# Patient Record
Sex: Female | Born: 1935 | Race: White | Hispanic: No | Marital: Single | Smoking: Never smoker
Health system: Southern US, Community
[De-identification: ages and names within clinical notes are randomized; demographics above are authoritative.]

## PROBLEM LIST (undated history)

## (undated) DIAGNOSIS — I10 Essential (primary) hypertension: Secondary | ICD-10-CM

## (undated) DIAGNOSIS — E7800 Pure hypercholesterolemia, unspecified: Secondary | ICD-10-CM

## (undated) DIAGNOSIS — F0390 Unspecified dementia without behavioral disturbance: Secondary | ICD-10-CM

## (undated) DIAGNOSIS — I639 Cerebral infarction, unspecified: Secondary | ICD-10-CM

## (undated) DIAGNOSIS — I509 Heart failure, unspecified: Secondary | ICD-10-CM

## (undated) DIAGNOSIS — J449 Chronic obstructive pulmonary disease, unspecified: Secondary | ICD-10-CM

## (undated) DIAGNOSIS — H539 Unspecified visual disturbance: Secondary | ICD-10-CM

## (undated) HISTORY — PX: CHOLECYSTECTOMY: SHX55

## (undated) HISTORY — DX: Unspecified visual disturbance: H53.9

## (undated) HISTORY — DX: Heart failure, unspecified: I50.9

## (undated) HISTORY — DX: Cerebral infarction, unspecified: I63.9

---

## 2015-02-04 DIAGNOSIS — S8991XA Unspecified injury of right lower leg, initial encounter: Secondary | ICD-10-CM | POA: Diagnosis not present

## 2015-02-04 DIAGNOSIS — M25551 Pain in right hip: Secondary | ICD-10-CM | POA: Diagnosis not present

## 2015-02-04 DIAGNOSIS — M25569 Pain in unspecified knee: Secondary | ICD-10-CM | POA: Diagnosis not present

## 2015-02-04 DIAGNOSIS — M25561 Pain in right knee: Secondary | ICD-10-CM | POA: Diagnosis not present

## 2015-02-04 DIAGNOSIS — W19XXXA Unspecified fall, initial encounter: Secondary | ICD-10-CM | POA: Diagnosis not present

## 2015-02-04 DIAGNOSIS — M549 Dorsalgia, unspecified: Secondary | ICD-10-CM | POA: Diagnosis not present

## 2015-02-04 DIAGNOSIS — S79911A Unspecified injury of right hip, initial encounter: Secondary | ICD-10-CM | POA: Diagnosis not present

## 2015-02-10 DIAGNOSIS — M545 Low back pain: Secondary | ICD-10-CM | POA: Diagnosis not present

## 2015-02-10 DIAGNOSIS — Z9189 Other specified personal risk factors, not elsewhere classified: Secondary | ICD-10-CM | POA: Diagnosis not present

## 2015-02-25 DIAGNOSIS — W19XXXA Unspecified fall, initial encounter: Secondary | ICD-10-CM | POA: Diagnosis not present

## 2015-02-25 DIAGNOSIS — R05 Cough: Secondary | ICD-10-CM | POA: Diagnosis not present

## 2015-06-02 DIAGNOSIS — I10 Essential (primary) hypertension: Secondary | ICD-10-CM | POA: Diagnosis not present

## 2015-06-02 DIAGNOSIS — E780 Pure hypercholesterolemia: Secondary | ICD-10-CM | POA: Diagnosis not present

## 2015-06-17 DIAGNOSIS — E875 Hyperkalemia: Secondary | ICD-10-CM | POA: Diagnosis not present

## 2015-06-17 DIAGNOSIS — I10 Essential (primary) hypertension: Secondary | ICD-10-CM | POA: Diagnosis not present

## 2015-06-17 DIAGNOSIS — R197 Diarrhea, unspecified: Secondary | ICD-10-CM | POA: Diagnosis not present

## 2015-06-17 DIAGNOSIS — R413 Other amnesia: Secondary | ICD-10-CM | POA: Diagnosis not present

## 2015-07-14 DIAGNOSIS — Z79899 Other long term (current) drug therapy: Secondary | ICD-10-CM | POA: Diagnosis not present

## 2015-07-14 DIAGNOSIS — R410 Disorientation, unspecified: Secondary | ICD-10-CM | POA: Diagnosis not present

## 2015-08-04 DIAGNOSIS — R351 Nocturia: Secondary | ICD-10-CM | POA: Diagnosis not present

## 2015-08-24 ENCOUNTER — Encounter (HOSPITAL_COMMUNITY): Payer: Self-pay | Admitting: Emergency Medicine

## 2015-08-24 ENCOUNTER — Emergency Department (INDEPENDENT_AMBULATORY_CARE_PROVIDER_SITE_OTHER)
Admission: EM | Admit: 2015-08-24 | Discharge: 2015-08-24 | Disposition: A | Discharge status: Home / Self Care | Payer: Medicare Other | Source: Home / Self Care | Attending: Family Medicine | Admitting: Family Medicine

## 2015-08-24 ENCOUNTER — Emergency Department (INDEPENDENT_AMBULATORY_CARE_PROVIDER_SITE_OTHER): Payer: Medicare Other

## 2015-08-24 DIAGNOSIS — M1711 Unilateral primary osteoarthritis, right knee: Secondary | ICD-10-CM

## 2015-08-24 DIAGNOSIS — M25561 Pain in right knee: Secondary | ICD-10-CM | POA: Diagnosis not present

## 2015-08-24 HISTORY — DX: Pure hypercholesterolemia, unspecified: E78.00

## 2015-08-24 HISTORY — DX: Essential (primary) hypertension: I10

## 2015-08-24 MED ORDER — HYDROCODONE-ACETAMINOPHEN 5-325 MG PO TABS: 1.0000 | ORAL_TABLET | Freq: Four times a day (QID) | ORAL | Status: DC | PRN

## 2015-08-24 NOTE — Discharge Instructions (Signed)
Use splint as possible, use pain medicine as needed, see orthopedist for further care .

## 2015-08-24 NOTE — ED Provider Notes (Addendum)
CSN: [id number]     Arrival date & time 08/24/15  1915 History   First MD Initiated Contact with Patient 08/24/15 2037     Chief Complaint  Patient presents with  . Knee Pain   (Consider location/radiation/quality/duration/timing/severity/associated sxs/prior Treatment) Patient is a 81 y.o. female presenting with knee pain. The history is provided by the patient and a caregiver.  Knee Pain Location:  Knee Time since incident:  12 hours Injury: no   Knee location:  R knee Pain details:    Quality:  Sharp   Radiates to:  Does not radiate   Severity:  Moderate   Onset quality:  Gradual   Progression:  Worsening Chronicity:  Recurrent Dislocation: no   Prior injury to area:  Yes (known end stage oa to knee , now unable to stand  unassisted, today knee gave way and  slid to floor while  assisted, continues with pain.) Relieved by:  None tried Worsened by:  Nothing tried Ineffective treatments:  None tried Associated symptoms: decreased ROM and muscle weakness   Risk factors: known bone disorder     Past Medical History  Diagnosis Date  . High cholesterol   . Hypertension    Past Surgical History  Procedure Laterality Date  . Cholecystectomy     No family history on file. Social History  Substance Use Topics  . Smoking status: None  . Smokeless tobacco: None  . Alcohol Use: None   OB History    No data available     Review of Systems  Constitutional: Negative.   Musculoskeletal: Positive for joint swelling and gait problem.  Skin: Negative.   All other systems reviewed and are negative.   Allergies  Review of patient's allergies indicates no known allergies.  Home Medications   Prior to Admission medications   Medication Sig Start Date End Date Taking? Authorizing Provider  ALPRAZolam Prudy Feeler) 0.5 MG tablet Take 0.5 mg by mouth at bedtime as needed for anxiety.   Yes Historical Provider, MD  amLODipine (NORVASC) 5 MG tablet Take 5 mg by mouth daily.   Yes  Historical Provider, MD  Cholecalciferol (VITAMIN D-3 PO) Take by mouth.   Yes Historical Provider, MD  indapamide (LOZOL) 2.5 MG tablet Take 2.5 mg by mouth daily.   Yes Historical Provider, MD  latanoprost (XALATAN) 0.005 % ophthalmic solution 1 drop at bedtime.   Yes Historical Provider, MD  meloxicam (MOBIC) 7.5 MG tablet Take 7.5 mg by mouth daily.   Yes Historical Provider, MD  Multiple Vitamins-Minerals (CENTRUM ADULTS PO) Take by mouth.   Yes Historical Provider, MD  simvastatin (ZOCOR) 20 MG tablet Take 20 mg by mouth daily.   Yes Historical Provider, MD  traMADol (ULTRAM) 50 MG tablet Take by mouth every 6 (six) hours as needed.   Yes Historical Provider, MD  traZODone (DESYREL) 50 MG tablet Take 50 mg by mouth at bedtime.   Yes Historical Provider, MD  HYDROcodone-acetaminophen (NORCO/VICODIN) 5-325 MG per tablet Take 1 tablet by mouth every 6 (six) hours as needed. 08/24/15   Linna Hoff, MD   Meds Ordered and Administered this Visit  Medications - No data to display  BP 182/81 mmHg  Pulse 70  Temp(Src) 97.6 F (36.4 C) (Oral)  Resp 18  SpO2 97% No data found.   Physical Exam  Constitutional: She is oriented to person, place, and time. She appears well-developed and well-nourished. No distress.  Musculoskeletal: She exhibits tenderness.       Right  knee: She exhibits decreased range of motion, swelling, effusion, deformity and bony tenderness. She exhibits no erythema. Tenderness found. Medial joint line and MCL tenderness noted.  Neurological: She is alert and oriented to person, place, and time.  Skin: Skin is warm and dry.  Nursing note and vitals reviewed.   ED Course  Procedures (including critical care time)  Labs Review Labs Reviewed - No data to display  Imaging Review No results found.  X-rays reviewed and report per radiologist.  Visual Acuity Review  Right Eye Distance:   Left Eye Distance:   Bilateral Distance:    Right Eye Near:   Left Eye  Near:    Bilateral Near:         MDM   1. Primary osteoarthritis of right knee    rx for norco and knee immobilizer.    Linna Hoff, MD 08/24/15 2122  Linna Hoff, MD 08/26/15 2052

## 2015-08-24 NOTE — ED Notes (Signed)
Knee pain and unwiling to bear weight on knee since this morning.  Patient went down to her knee , assisted to her knee by caregiver.

## 2015-08-27 DIAGNOSIS — M1711 Unilateral primary osteoarthritis, right knee: Secondary | ICD-10-CM | POA: Diagnosis not present

## 2015-09-03 DIAGNOSIS — M1711 Unilateral primary osteoarthritis, right knee: Secondary | ICD-10-CM | POA: Diagnosis not present

## 2015-09-15 DIAGNOSIS — Z23 Encounter for immunization: Secondary | ICD-10-CM | POA: Diagnosis not present

## 2015-09-15 DIAGNOSIS — M25561 Pain in right knee: Secondary | ICD-10-CM | POA: Diagnosis not present

## 2015-09-15 DIAGNOSIS — R413 Other amnesia: Secondary | ICD-10-CM | POA: Diagnosis not present

## 2015-09-17 DIAGNOSIS — M1711 Unilateral primary osteoarthritis, right knee: Secondary | ICD-10-CM | POA: Diagnosis not present

## 2015-09-18 ENCOUNTER — Emergency Department (HOSPITAL_COMMUNITY): Payer: Medicare Other

## 2015-09-18 ENCOUNTER — Inpatient Hospital Stay (HOSPITAL_COMMUNITY)
Admission: EM | Admit: 2015-09-18 | Discharge: 2015-09-20 | DRG: 177 | Disposition: A | Discharge status: Home / Self Care | Payer: Medicare Other | Attending: Internal Medicine | Admitting: Internal Medicine

## 2015-09-18 ENCOUNTER — Encounter (HOSPITAL_COMMUNITY): Payer: Self-pay

## 2015-09-18 DIAGNOSIS — F03918 Unspecified dementia, unspecified severity, with other behavioral disturbance: Secondary | ICD-10-CM | POA: Diagnosis present

## 2015-09-18 DIAGNOSIS — G9340 Encephalopathy, unspecified: Secondary | ICD-10-CM | POA: Diagnosis not present

## 2015-09-18 DIAGNOSIS — Z66 Do not resuscitate: Secondary | ICD-10-CM | POA: Diagnosis not present

## 2015-09-18 DIAGNOSIS — Z791 Long term (current) use of non-steroidal anti-inflammatories (NSAID): Secondary | ICD-10-CM | POA: Diagnosis not present

## 2015-09-18 DIAGNOSIS — R4182 Altered mental status, unspecified: Secondary | ICD-10-CM | POA: Diagnosis not present

## 2015-09-18 DIAGNOSIS — J189 Pneumonia, unspecified organism: Secondary | ICD-10-CM | POA: Diagnosis present

## 2015-09-18 DIAGNOSIS — R55 Syncope and collapse: Secondary | ICD-10-CM | POA: Diagnosis not present

## 2015-09-18 DIAGNOSIS — J690 Pneumonitis due to inhalation of food and vomit: Secondary | ICD-10-CM | POA: Diagnosis not present

## 2015-09-18 DIAGNOSIS — Z79899 Other long term (current) drug therapy: Secondary | ICD-10-CM

## 2015-09-18 DIAGNOSIS — R0682 Tachypnea, not elsewhere classified: Secondary | ICD-10-CM | POA: Diagnosis present

## 2015-09-18 DIAGNOSIS — R0602 Shortness of breath: Secondary | ICD-10-CM | POA: Diagnosis not present

## 2015-09-18 DIAGNOSIS — R109 Unspecified abdominal pain: Secondary | ICD-10-CM | POA: Diagnosis not present

## 2015-09-18 DIAGNOSIS — F0391 Unspecified dementia with behavioral disturbance: Secondary | ICD-10-CM | POA: Diagnosis not present

## 2015-09-18 DIAGNOSIS — I10 Essential (primary) hypertension: Secondary | ICD-10-CM | POA: Diagnosis present

## 2015-09-18 DIAGNOSIS — R402431 Glasgow coma scale score 3-8, in the field [EMT or ambulance]: Secondary | ICD-10-CM | POA: Diagnosis not present

## 2015-09-18 DIAGNOSIS — E785 Hyperlipidemia, unspecified: Secondary | ICD-10-CM | POA: Diagnosis present

## 2015-09-18 DIAGNOSIS — E876 Hypokalemia: Secondary | ICD-10-CM | POA: Diagnosis present

## 2015-09-18 DIAGNOSIS — E7800 Pure hypercholesterolemia, unspecified: Secondary | ICD-10-CM | POA: Diagnosis not present

## 2015-09-18 LAB — URINALYSIS, ROUTINE W REFLEX MICROSCOPIC
Bilirubin Urine: NEGATIVE
Glucose, UA: NEGATIVE mg/dL
Hgb urine dipstick: NEGATIVE
KETONES UR: NEGATIVE mg/dL
LEUKOCYTES UA: NEGATIVE
NITRITE: NEGATIVE
PH: 7 (ref 5.0–8.0)
PROTEIN: NEGATIVE mg/dL
Specific Gravity, Urine: 1.023 (ref 1.005–1.030)
Urobilinogen, UA: 0.2 mg/dL (ref 0.0–1.0)

## 2015-09-18 LAB — CBC WITH DIFFERENTIAL/PLATELET
BASOS ABS: 0 10*3/uL (ref 0.0–0.1)
BASOS PCT: 0 %
EOS PCT: 0 %
Eosinophils Absolute: 0 10*3/uL (ref 0.0–0.7)
HCT: 43.9 % (ref 36.0–46.0)
Hemoglobin: 15.1 g/dL — ABNORMAL HIGH (ref 12.0–15.0)
Lymphocytes Relative: 13 %
Lymphs Abs: 1.3 10*3/uL (ref 0.7–4.0)
MCH: 32.2 pg (ref 26.0–34.0)
MCHC: 34.4 g/dL (ref 30.0–36.0)
MCV: 93.6 fL (ref 78.0–100.0)
MONO ABS: 0.8 10*3/uL (ref 0.1–1.0)
Monocytes Relative: 8 %
Neutro Abs: 7.5 10*3/uL (ref 1.7–7.7)
Neutrophils Relative %: 79 %
PLATELETS: 187 10*3/uL (ref 150–400)
RBC: 4.69 MIL/uL (ref 3.87–5.11)
RDW: 12.9 % (ref 11.5–15.5)
WBC: 9.5 10*3/uL (ref 4.0–10.5)

## 2015-09-18 LAB — COMPREHENSIVE METABOLIC PANEL
ALBUMIN: 3.9 g/dL (ref 3.5–5.0)
ALT: 22 U/L (ref 14–54)
AST: 30 U/L (ref 15–41)
Alkaline Phosphatase: 88 U/L (ref 38–126)
Anion gap: 11 (ref 5–15)
BUN: 17 mg/dL (ref 6–20)
CHLORIDE: 97 mmol/L — AB (ref 101–111)
CO2: 26 mmol/L (ref 22–32)
Calcium: 10.1 mg/dL (ref 8.9–10.3)
Creatinine, Ser: 0.83 mg/dL (ref 0.44–1.00)
GFR calc Af Amer: >60 mL/min (ref >60)
GFR, EST NON AFRICAN AMERICAN: 59 mL/min — AB (ref >60)
Glucose, Bld: 143 mg/dL — ABNORMAL HIGH (ref 65–99)
POTASSIUM: 3.4 mmol/L — AB (ref 3.5–5.1)
Sodium: 134 mmol/L — ABNORMAL LOW (ref 135–145)
Total Bilirubin: 1 mg/dL (ref 0.3–1.2)
Total Protein: 6.9 g/dL (ref 6.5–8.1)

## 2015-09-18 LAB — I-STAT CG4 LACTIC ACID, ED
LACTIC ACID, VENOUS: 1.21 mmol/L (ref 0.5–2.0)
LACTIC ACID, VENOUS: 0.75 mmol/L (ref 0.5–2.0)

## 2015-09-18 LAB — LIPASE, BLOOD: LIPASE: 19 U/L — AB (ref 22–51)

## 2015-09-18 LAB — TYPE AND SCREEN
ABO/RH(D): A POS
ANTIBODY SCREEN: NEGATIVE

## 2015-09-18 LAB — ABO/RH: ABO/RH(D): A POS

## 2015-09-18 LAB — STREP PNEUMONIAE URINARY ANTIGEN: STREP PNEUMO URINARY ANTIGEN: NEGATIVE

## 2015-09-18 MED ORDER — VANCOMYCIN HCL 10 G IV SOLR
1500.0000 mg | Freq: Once | INTRAVENOUS | Status: AC
  Administered 2015-09-18: 1500 mg via INTRAVENOUS
  Filled 2015-09-18: qty 1500

## 2015-09-18 MED ORDER — SIMVASTATIN 20 MG PO TABS
20.0000 mg | ORAL_TABLET | Freq: Every day | ORAL | Status: DC
  Administered 2015-09-19 – 2015-09-20 (×2): 20 mg via ORAL
  Filled 2015-09-18 (×2): qty 1

## 2015-09-18 MED ORDER — DEXTROSE 5 % IV SOLN
500.0000 mg | INTRAVENOUS | Status: DC
  Administered 2015-09-19 (×2): 500 mg via INTRAVENOUS
  Filled 2015-09-18 (×2): qty 500

## 2015-09-18 MED ORDER — LATANOPROST 0.005 % OP SOLN
1.0000 [drp] | Freq: Every day | OPHTHALMIC | Status: DC
  Administered 2015-09-18 – 2015-09-19 (×2): 1 [drp] via OPHTHALMIC
  Filled 2015-09-18: qty 2.5

## 2015-09-18 MED ORDER — DEXTROSE 5 % IV SOLN
1.0000 g | INTRAVENOUS | Status: DC
  Administered 2015-09-18 – 2015-09-19 (×2): 1 g via INTRAVENOUS
  Filled 2015-09-18 (×3): qty 10

## 2015-09-18 MED ORDER — SODIUM CHLORIDE 0.9 % IV BOLUS (SEPSIS)
1000.0000 mL | Freq: Once | INTRAVENOUS | Status: AC
Start: 2015-09-18 — End: 2015-09-18
  Administered 2015-09-18: 1000 mL via INTRAVENOUS

## 2015-09-18 MED ORDER — TRAMADOL HCL 50 MG PO TABS: 50.0000 mg | ORAL_TABLET | Freq: Four times a day (QID) | ORAL | Status: DC | PRN

## 2015-09-18 MED ORDER — SODIUM CHLORIDE 0.9 % IV SOLN
INTRAVENOUS | Status: AC
  Administered 2015-09-18: 23:00:00 via INTRAVENOUS

## 2015-09-18 MED ORDER — ONDANSETRON HCL 4 MG/2ML IJ SOLN
4.0000 mg | Freq: Once | INTRAMUSCULAR | Status: AC
  Administered 2015-09-18: 4 mg via INTRAVENOUS
  Filled 2015-09-18: qty 2

## 2015-09-18 MED ORDER — HEPARIN SODIUM (PORCINE) 5000 UNIT/ML IJ SOLN
5000.0000 [IU] | Freq: Three times a day (TID) | INTRAMUSCULAR | Status: DC
  Administered 2015-09-18 – 2015-09-20 (×5): 5000 [IU] via SUBCUTANEOUS
  Filled 2015-09-18 (×6): qty 1

## 2015-09-18 MED ORDER — PIPERACILLIN-TAZOBACTAM 3.375 G IVPB 30 MIN
3.3750 g | Freq: Once | INTRAVENOUS | Status: AC
  Administered 2015-09-18: 3.375 g via INTRAVENOUS
  Filled 2015-09-18: qty 50

## 2015-09-18 MED ORDER — AMLODIPINE BESYLATE 5 MG PO TABS: 5.0000 mg | ORAL_TABLET | Freq: Every day | ORAL | Status: DC

## 2015-09-18 NOTE — ED Notes (Signed)
PER EMS: Ems called out for an unresponsive patient, pt not responsive to painful stimuli or voice. Hx of dementia. Pt family reports decline in past 2 days by being combative, erratic behavior that is unlike her. BP-162/82, HR-82, RR-14, CBG-106. Pt vomiting upon arrival. Resident at bedside upon pt arrival.

## 2015-09-18 NOTE — Progress Notes (Signed)
Report received from [name], RN from ED. Pt is to be admitted in  5W16. Awaiting pt's arrival.

## 2015-09-18 NOTE — ED Provider Notes (Signed)
CSN: [id number]     Arrival date & time 09/18/15  1527 History   First MD Initiated Contact with Patient 09/18/15 1531     Chief Complaint  Patient presents with  . Altered Mental Status   Katie Mcfarland is a 81 y.o. female with a past medical history significant for dementia, hypertension, hypercholesterolemia, and history of cholecystectomy who presents for altered mental status and vomiting. The patient is DO NOT RESUSCITATE. The patient is coming by her daughter, healthcare power of attorney, who reports that the patient has had worsening dementia for the last several months. The patient's family says that this has acutely worsened over the last 3 weeks with worsening agitation. The patient reportedly began acting less responsive yesterday bleeding up until today when she was not interacting with any visitors. The patient then had several episodes of emesis and was brought to the emergency department because she was unresponsive for the nursing home staff. The daughter reports that the patient has been eating less recently. They deny any recent falls or trauma. The patient has not had any fevers or chills, constipation, diarrhea. They do say that the patient has not urinated since yesterday. The patient recently started risperidone.    (Consider location/radiation/quality/duration/timing/severity/associated sxs/prior Treatment) Patient is a 81 y.o. female presenting with altered mental status. The history is provided by the EMS personnel, medical records and a relative (health care power of attorney). The history is limited by the condition of the patient.  Altered Mental Status Presenting symptoms: unresponsiveness   Severity:  Severe Most recent episode:  Yesterday Episode history:  Single Duration:  2 days Timing:  Constant Chronicity:  New Context: dementia and nursing home resident   Associated symptoms: decreased appetite, nausea and vomiting   Associated symptoms: no  abdominal pain, no bladder incontinence (pt has not urinated today), no fever and no rash   Vomiting:    Quality:  Stomach contents   Number of occurrences:  4 times in ED   Severity:  Moderate   Progression:  Unchanged   Past Medical History  Diagnosis Date  . High cholesterol   . Hypertension    Past Surgical History  Procedure Laterality Date  . Cholecystectomy     No family history on file. Social History  Substance Use Topics  . Smoking status: Not on file  . Smokeless tobacco: Not on file  . Alcohol Use: Not on file   OB History    No data available     Review of Systems  Unable to perform ROS: Mental status change (ROS provided by family)  Constitutional: Positive for activity change (less active), appetite change and decreased appetite. Negative for fever and chills.  HENT: Negative for congestion.   Respiratory: Negative for shortness of breath, wheezing and stridor.   Cardiovascular: Negative for leg swelling.  Gastrointestinal: Positive for nausea, vomiting and constipation. Negative for abdominal pain, diarrhea, blood in stool and abdominal distention.  Genitourinary: Positive for difficulty urinating. Negative for bladder incontinence (pt has not urinated today).  Skin: Negative for rash and wound.      Allergies  Review of patient's allergies indicates no known allergies.  Home Medications   Prior to Admission medications   Medication Sig Start Date End Date Taking? Authorizing Provider  ALPRAZolam Prudy Feeler) 0.5 MG tablet Take 0.5 mg by mouth at bedtime as needed for anxiety.    Historical Provider, MD  amLODipine (NORVASC) 5 MG tablet Take 5 mg by mouth daily.  Historical Provider, MD  Cholecalciferol (VITAMIN D-3 PO) Take by mouth.    Historical Provider, MD  HYDROcodone-acetaminophen (NORCO/VICODIN) 5-325 MG per tablet Take 1 tablet by mouth every 6 (six) hours as needed. 08/24/15   Linna Hoff, MD  indapamide (LOZOL) 2.5 MG tablet Take 2.5 mg by  mouth daily.    Historical Provider, MD  latanoprost (XALATAN) 0.005 % ophthalmic solution 1 drop at bedtime.    Historical Provider, MD  meloxicam (MOBIC) 7.5 MG tablet Take 7.5 mg by mouth daily.    Historical Provider, MD  Multiple Vitamins-Minerals (CENTRUM ADULTS PO) Take by mouth.    Historical Provider, MD  simvastatin (ZOCOR) 20 MG tablet Take 20 mg by mouth daily.    Historical Provider, MD  traMADol (ULTRAM) 50 MG tablet Take by mouth every 6 (six) hours as needed.    Historical Provider, MD  traZODone (DESYREL) 50 MG tablet Take 50 mg by mouth at bedtime.    Historical Provider, MD   There were no vitals taken for this visit. Physical Exam  Constitutional: She appears well-developed. No distress.  HENT:  Head: Normocephalic and atraumatic.  Mouth/Throat: No oropharyngeal exudate.  Eyes: Conjunctivae and EOM are normal. Pupils are equal, round, and reactive to light.  Cardiovascular: Normal rate, normal heart sounds and intact distal pulses.   No murmur heard. Pulmonary/Chest: Effort normal. No accessory muscle usage or stridor. Tachypnea noted. No respiratory distress. She has no decreased breath sounds. She has no wheezes. She has rhonchi. She exhibits no tenderness.  Abdominal: Soft. She exhibits no distension. There is no tenderness. There is no rebound.  Musculoskeletal: She exhibits no tenderness.  Neurological: She is unresponsive. She exhibits normal muscle tone. GCS eye subscore is 4. GCS verbal subscore is 1. GCS motor subscore is 4.  Skin: Skin is warm. She is not diaphoretic. No erythema.  Nursing note and vitals reviewed.   ED Course  Procedures (including critical care time) Labs Review Labs Reviewed  CBC WITH DIFFERENTIAL/PLATELET - Abnormal; Notable for the following:    Hemoglobin 15.1 (*)    All other components within normal limits  COMPREHENSIVE METABOLIC PANEL - Abnormal; Notable for the following:    Sodium 134 (*)    Potassium 3.4 (*)    Chloride 97  (*)    Glucose, Bld 143 (*)    GFR calc non Af Amer 59 (*)    All other components within normal limits  LIPASE, BLOOD - Abnormal; Notable for the following:    Lipase 19 (*)    All other components within normal limits  URINE CULTURE  CULTURE, BLOOD (ROUTINE X 2)  CULTURE, BLOOD (ROUTINE X 2)  URINALYSIS, ROUTINE W REFLEX MICROSCOPIC (NOT AT [facility])  I-STAT CG4 LACTIC ACID, ED  TYPE AND SCREEN  ABO/RH    Imaging Review Dg Abd 1 View  09/18/2015  CLINICAL DATA:  Vomiting, altered mental status for 1 day EXAM: ABDOMEN - 1 VIEW COMPARISON:  02/04/2015 FINDINGS: Prior cholecystectomy. Large stool burden throughout the colon. No evidence of bowel obstruction or free air. No organomegaly. No acute bony abnormality. IMPRESSION: Large stool burden.  No evidence of bowel obstruction or free air. Electronically Signed   By: Charlett Nose M.D.   On: 09/18/2015 16:31   Dg Chest Portable 1 View  09/18/2015  CLINICAL DATA:  Shortness of breath with altered mental status. EXAM: PORTABLE CHEST 1 VIEW COMPARISON:  December 24, 2013 FINDINGS: There is localized increased opacity in the left base. Lungs  elsewhere clear. Heart size and pulmonary vascularity are normal. No adenopathy. There is thoracolumbar levoscoliosis. IMPRESSION: Increased opacity left base, concerning for early pneumonia. Lungs elsewhere clear. No change in cardiac silhouette. Followup PA and lateral chest radiographs recommended in 3-4 weeks following trial of antibiotic therapy to ensure resolution and exclude underlying malignancy. Electronically Signed   By: Bretta BangWilliam  Woodruff III M.D.   On: 09/18/2015 16:07   I have personally reviewed and evaluated these images and lab results as part of my medical decision-making.   EKG Interpretation   Date/Time:  Friday September 18 2015 15:36:53 EDT Ventricular Rate:  84 PR Interval:  154 QRS Duration: 94 QT Interval:  353 QTC Calculation: 417 R Axis:   -58 Text Interpretation:  Sinus rhythm  Probable left atrial enlargement LAD,  consider left anterior fascicular block Abnormal R-wave progression, late  transition agree with above Confirmed by LITTLE MD, RACHEL [phone]([id number]) on  09/18/2015 3:54:30 PM      MDM   Katie Mcfarland is a 81 y.o. female with a past medical history significant for dementia, hypertension, hypercholesterolemia, and history of cholecystectomy who presents for altered mental status and vomiting. The patient arrived by EMS with her healthcare power of attorney, her daughter. The daughter confirmed that the patient is DO NOT RESUSCITATE and would not want to be intubated. On initial assessment, the patient has an altered mental status and is unable to respond to this examiner. Her GCS was determined to be approximately 9 on arrival. The patient is maintaining her oxygen saturation but was slightly tachypneic. The patient was not febrile on arrival. The daughter reports that the has had slowly progressive decline over the last several months with her dementia and accelerating the last 3 weeks but her symptoms at yesterday. The patient was noted to have several episodes of nonbloody emesis on arrival. The patient had not urinated since yesterday.  On exam, the patient has slightly coarse breath sounds bilaterally. Given the patient's tachypnea, altered mental status, and coarse breath sounds, suspect pneumonia and possible aspiration pneumonia with the emesis and altered mental status. The patient had a portable chest x-ray performed which revealed concern for developing pneumonia. The patient was given fluids, antibiotics were ordered, other laboratory testing were obtained, as well as cultures.   As the patient is 81 years old and altered with emesis, a CT of the head was also obtained in order to look for stroke or bleed. The patient did not have a focal neurological exam however, she did not participate in her neurological exam. The patient also had an x-ray of  the abdomen obtained given the emesis. There is no evidence of obstruction, free air. There was a large stool burden. The patient's abdomen was nontender to palpation.  The patient's CT did not show any evidence of acute intracranial abnormality.  Given the patient's lack of urination, suspect urinary tract infection causing altered mental status leading to the aspiration pneumonia.  Anticipate admission following completion of laboratory testing. The patient was admitted to the hospitalist service in stable condition for further management.  This patient was seen with Dr. Clarene DukeLittle, emergency medicine attending.   Final diagnoses:  Aspiration pneumonia, unspecified aspiration pneumonia type, unspecified laterality, unspecified part of lung [facility](HCC)      Theda Belfasthris Tegeler, MD 09/19/15 [id number]  Laurence Spatesachel Morgan Little, MD 09/19/15 [phone]

## 2015-09-18 NOTE — H&P (Signed)
[facility] History and Physical  [name] ZOX:[id number] DOB: 1920-11-15 DOA: 09/18/2015  Referring physician: ED physician PCP: Londell Moh, MD   Chief Complaint: change in behavior  HPI:  Ms. Katie Mcfarland is a 81yo woman, relatively healthy, with progressive dementia, HTN and HLD.  She presents due to change in behavior over the last month with becoming more combative, manipulative and difficult to manage.  This has gotten much worse of the last couple of days and today her family and caretaker report that she has not communicated at all with any of them.  She was brought to the ED for this lack of communication, and it continued in the ED.  She has not had much to eat or drink in that time.  In the ED, she was found to have vomited on herself and there was concern for aspiration.  She is DNR and her daughter in law notes that she is "ready to go" and has been since her husband died 2 years ago.  She lives at home and has 24 hour caregivers.  Ms. Katie Mcfarland was recently started on risperdone, but only took one dose the night before admission.   In the ED, the patient was non responsive, but responded to pain and protected her airway.  She had imaging, AXR revealed large stool burder.  CT head showed no new abnormality but chronic changes, CXR showed left base opacity concerning for developing PNA.  UA was clear.  Labs were relatively normal, K was mildly low at 3.4.  She was initiated on treatment for pneumonia.    Assessment and Plan:  LLL Pneumonia, possible pneumonitis - Unclear if this was an aspiration event, only reported vomiting in the ED - Possible developing pneumonia, she is coming from home, will treat as community acquired with Rocephin and Azithromycin - Monitor for improvement - O2 therapy as needed - BC X 2 and sputum culture if able - IVF with NS at 50cc/hr, increase if she is not improving - Zofran for nausea  Dementia with behavioral  disturbance - Progressively declining per family - Monitor closely - Hold risperidone - Low dose haldol if needed for behavioral issues    HLD (hyperlipidemia) - Continue simvastatin when able    HTN (hypertension) - Hydralazine PRN for SBP > 180 - Restart home meds when she is able to take them.    Diet: Clear liquids in case she is more alert, consider holding diet if appears to be unable to swallow.  Sitter will be with her  DVT PPx: Heparin SQ   Radiological Exams on Admission: Dg Abd 1 View  09/18/2015  CLINICAL DATA:  Vomiting, altered mental status for 1 day EXAM: ABDOMEN - 1 VIEW COMPARISON:  02/04/2015 FINDINGS: Prior cholecystectomy. Large stool burden throughout the colon. No evidence of bowel obstruction or free air. No organomegaly. No acute bony abnormality. IMPRESSION: Large stool burden.  No evidence of bowel obstruction or free air. Electronically Signed   By: Charlett Nose M.D.   On: 09/18/2015 16:31   Ct Head Wo Contrast  09/18/2015  CLINICAL DATA:  81 year old female -altered mental status and unresponsive. EXAM: CT HEAD WITHOUT CONTRAST TECHNIQUE: Contiguous axial images were obtained from the base of the skull through the vertex without intravenous contrast. COMPARISON:  None FINDINGS: Mild atrophy and moderate chronic small-vessel white matter ischemic changes are noted. 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 No acute intracranial abnormalities are identified, including mass lesion or mass effect, hydrocephalus, extra-axial fluid collection, midline  shift, hemorrhage, or acute infarction. The visualized bony calvarium is unremarkable. IMPRESSION: No evidence of acute intracranial abnormality. Atrophy, chronic small-vessel white matter ischemic changes and remote bilateral cerebellar infarcts. Electronically Signed   By: Harmon Pier M.D.   On: 09/18/2015 17:26   Dg Chest Portable 1 View  09/18/2015  CLINICAL DATA:  Shortness of breath with altered mental  status. EXAM: PORTABLE CHEST 1 VIEW COMPARISON:  December 24, 2013 FINDINGS: There is localized increased opacity in the left base. Lungs elsewhere clear. Heart size and pulmonary vascularity are normal. No adenopathy. There is thoracolumbar levoscoliosis. IMPRESSION: Increased opacity left base, concerning for early pneumonia. Lungs elsewhere clear. No change in cardiac silhouette. Followup PA and lateral chest radiographs recommended in 3-4 weeks following trial of antibiotic therapy to ensure resolution and exclude underlying malignancy. Electronically Signed   By: Bretta Bang III M.D.   On: 09/18/2015 16:07   Code Status: DNR/DNI Family Communication: Daughter in Social worker and caretaker at bedside Disposition Plan: Admit for further evaluation    Debe Coder, MD [phone]   Review of Systems, unable to be obtained due to patient being unresponsive and acute pneumonia.  Answers are based on family responses:  Constitutional: Negative for fever, chills.  + for change in behavior HENT: Negative forcongestion, sore throat Respiratory: Negative for cough, sputum production, shortness of breath Cardiovascular: Negative for chest pain, palpitations Gastrointestinal: Negative for nausea, vomiting and abdominal pain Genitourinary: Negative for dysuria, urgency Neurological: Negative for dizziness and weakness.    Past Medical History  Diagnosis Date  . High cholesterol   . Hypertension     Past Surgical History  Procedure Laterality Date  . Cholecystectomy      Social History:  reports that she has never smoked. She does not have any smokeless tobacco history on file. She reports that she does not drink alcohol or use illicit drugs.  No Known Allergies  Could not be provided as patient nonverbal on exam due to acute medical issues, dementia and pneumonia.   Prior to Admission medications   Medication Sig Start Date End Date Taking? Authorizing Provider  amLODipine (NORVASC) 5 MG  tablet Take 5 mg by mouth daily.   Yes Historical Provider, MD  Cholecalciferol (VITAMIN D-3 PO) Take 1 tablet by mouth daily.    Yes Historical Provider, MD  indapamide (LOZOL) 2.5 MG tablet Take 2.5 mg by mouth daily.   Yes Historical Provider, MD  latanoprost (XALATAN) 0.005 % ophthalmic solution Place 1 drop into both eyes at bedtime.    Yes Historical Provider, MD  meloxicam (MOBIC) 7.5 MG tablet Take 7.5 mg by mouth daily.   Yes Historical Provider, MD  Multiple Vitamins-Minerals (CENTRUM ADULTS PO) Take 1 tablet by mouth daily.    Yes Historical Provider, MD  risperiDONE (RISPERDAL) 0.5 MG tablet Take 0.5 mg by mouth at bedtime.   Yes Historical Provider, MD  simvastatin (ZOCOR) 20 MG tablet Take 20 mg by mouth daily.   Yes Historical Provider, MD  traMADol (ULTRAM) 50 MG tablet Take 50 mg by mouth every 6 (six) hours as needed for moderate pain.    Yes Historical Provider, MD         HYDROcodone-acetaminophen (NORCO/VICODIN) 5-325 MG per tablet Take 1 tablet by mouth every 6 (six) hours as needed. 08/24/15   Linna Hoff, MD    Physical Exam: Filed Vitals:   09/18/15 2100 09/18/15 2115 09/18/15 2221 09/18/15 2227  BP: 169/72 161/90  151/62  Pulse: 87 69  80  Temp:    98.5 F (36.9 C)  TempSrc:    Oral  Resp: 29 27  24   Height:   4\' 8"  (1.422 m)   Weight:   124 lb 12.8 oz (56.609 kg)   SpO2: 93% 94%  93%    Physical Exam  Constitutional: Thin, elderly woman, appears asleep HENT: Normocephalic. Oropharynx mildly dry Eyes: Unable to examine, patient would not open eyes Neck: Unable to examine CVS: RR, NR, S1/S2 +, no murmurs Pulmonary: Effort normal, CTAB anteriorly, no wheezing Abdominal: Soft. BS +,  no distension, no apparent tenderness Musculoskeletal: No edema and no tenderness.  Lymphadenopathy: No lymphadenopathy noted, cervical, supraclavicular. Neuro: Unable to be examined, withdraws from pain, GCS 5 Skin: Skin is warm and dry. No rash noted  Labs on Admission:   Basic Metabolic Panel:  Recent Labs Lab 09/18/15 1605  NA 134*  K 3.4*  CL 97*  CO2 26  GLUCOSE 143*  BUN 17  CREATININE 0.83  CALCIUM 10.1   Liver Function Tests:  Recent Labs Lab 09/18/15 1605  AST 30  ALT 22  ALKPHOS 88  BILITOT 1.0  PROT 6.9  ALBUMIN 3.9    Recent Labs Lab 09/18/15 1605  LIPASE 19*   CBC:  Recent Labs Lab 09/18/15 1605  WBC 9.5  NEUTROABS 7.5  HGB 15.1*  HCT 43.9  MCV 93.6  PLT 187    EKG: Normal sinus rhythm, no ST/T wave changes, poor R wave progression  If 7PM-7AM, please contact night-coverage [email or web address] Password [email or web address] 09/19/2015, 1:25 AM

## 2015-09-19 ENCOUNTER — Encounter (HOSPITAL_COMMUNITY): Payer: Self-pay | Admitting: Internal Medicine

## 2015-09-19 DIAGNOSIS — E785 Hyperlipidemia, unspecified: Secondary | ICD-10-CM | POA: Diagnosis present

## 2015-09-19 DIAGNOSIS — J189 Pneumonia, unspecified organism: Secondary | ICD-10-CM

## 2015-09-19 DIAGNOSIS — I10 Essential (primary) hypertension: Secondary | ICD-10-CM | POA: Diagnosis present

## 2015-09-19 DIAGNOSIS — G9340 Encephalopathy, unspecified: Secondary | ICD-10-CM | POA: Diagnosis present

## 2015-09-19 DIAGNOSIS — F0391 Unspecified dementia with behavioral disturbance: Secondary | ICD-10-CM

## 2015-09-19 LAB — BASIC METABOLIC PANEL
Anion gap: 12 (ref 5–15)
BUN: 13 mg/dL (ref 6–20)
CHLORIDE: 101 mmol/L (ref 101–111)
CO2: 25 mmol/L (ref 22–32)
CREATININE: 0.77 mg/dL (ref 0.44–1.00)
Calcium: 9.5 mg/dL (ref 8.9–10.3)
Glucose, Bld: 106 mg/dL — ABNORMAL HIGH (ref 65–99)
POTASSIUM: 3.3 mmol/L — AB (ref 3.5–5.1)
SODIUM: 138 mmol/L (ref 135–145)

## 2015-09-19 LAB — CBC
HCT: 41.4 % (ref 36.0–46.0)
Hemoglobin: 14.2 g/dL (ref 12.0–15.0)
MCH: 32.1 pg (ref 26.0–34.0)
MCHC: 34.3 g/dL (ref 30.0–36.0)
MCV: 93.5 fL (ref 78.0–100.0)
PLATELETS: 179 10*3/uL (ref 150–400)
RBC: 4.43 MIL/uL (ref 3.87–5.11)
RDW: 12.9 % (ref 11.5–15.5)
WBC: 9.4 10*3/uL (ref 4.0–10.5)

## 2015-09-19 LAB — HIV ANTIBODY (ROUTINE TESTING W REFLEX): HIV Screen 4th Generation wRfx: NONREACTIVE

## 2015-09-19 LAB — CK: CK TOTAL: 26 U/L — AB (ref 38–234)

## 2015-09-19 MED ORDER — HYDRALAZINE HCL 20 MG/ML IJ SOLN
2.0000 mg | INTRAMUSCULAR | Status: DC | PRN
  Administered 2015-09-19: 2 mg via INTRAVENOUS
  Filled 2015-09-19: qty 1

## 2015-09-19 MED ORDER — ACETAMINOPHEN 325 MG PO TABS: 650.0000 mg | ORAL_TABLET | ORAL | Status: DC | PRN

## 2015-09-19 MED ORDER — BOOST / RESOURCE BREEZE PO LIQD
1.0000 | Freq: Two times a day (BID) | ORAL | Status: DC
  Administered 2015-09-19 – 2015-09-20 (×2): 1 via ORAL

## 2015-09-19 MED ORDER — POTASSIUM CHLORIDE CRYS ER 20 MEQ PO TBCR
40.0000 meq | EXTENDED_RELEASE_TABLET | Freq: Once | ORAL | Status: AC
  Administered 2015-09-19: 40 meq via ORAL
  Filled 2015-09-19: qty 2

## 2015-09-19 MED ORDER — AMLODIPINE BESYLATE 5 MG PO TABS
5.0000 mg | ORAL_TABLET | Freq: Every day | ORAL | Status: DC
  Administered 2015-09-19 – 2015-09-20 (×2): 5 mg via ORAL
  Filled 2015-09-19 (×2): qty 1

## 2015-09-19 NOTE — Progress Notes (Signed)
Pt has not voided since her arrival to unit. Bladder scanned with 761 ml residual. In and out cath done per existing order with 900 cc urine output. Will cont to monitor.

## 2015-09-19 NOTE — Progress Notes (Signed)
Initial Nutrition Assessment  DOCUMENTATION CODES:  Not applicable  INTERVENTION:  Recommend diet advancement as pt seemed to very alert and responsive. If still concern for aspiration, recommend ST eval.  Took meal/beverage preferences  Boost Breeze po BID, each supplement provides 250 kcal and 9 grams of protein  NUTRITION DIAGNOSIS:  Inadequate oral intake related to poor appetite as evidenced by per patient/family report of pt eating only a few bites each day  GOAL:  Patient will meet greater than or equal to 90% of their needs   MONITOR:   PO intake, Supplement acceptance, Diet advancement, Labs Work up  REASON FOR ASSESSMENT:  Consult Poor PO  ASSESSMENT:  81yo  relatively healthy PMHX dementia, HTN and HLD. She presents w/ behavior change over last month-becoming more combative, manipulative and difficult to manage. This has worsened over the last couple days. She has not had much to eat or drink in that time. Pt initially being treated for PNA- aspiration concern.   Spoke with pt and her sitter. Pt was eating very well up until a few weeks ago. Since that time pt's appetite has decreased dramatically. The caregiver and family have really had to push fluids to keep her hydrated.   Since pt has been admitted, sitter states that pt ate about 4-5 bites of breakfast and 50% of lunch. Patient believes she is eating well. She denies having any n/v/c/d. She does not take any vitamin, minerals or oral nutritional supplements, though the sitter reports that the family would like the pt started on one.   Asked pt multiple times for food preferences, but she said she likes what she is receiving. Sitter says the pt will only drink hot tea and water. Will try to get hot tea on pt's L/D trays  Pt's current diet is CL. This was reportedly due to patient's lack of alertness. She seemed very awake when I spoke with her. I would recommend for her diet to be advanced unless there is further  concern for aspiration, in which case ST should be consulted.   Pts normal weight is reported to be 123 lbs which would indicate that pt has not lost any weight, despite a poor reported appetite.   NFPE: appears WDL  Diet Order:  Diet clear liquid Room service appropriate?: Yes; Fluid consistency:: Thin  Skin:  Reviewed, no issues   Last BM:  10/14  Height:  Ht Readings from Last 1 Encounters:  09/18/15 4\' 8"  (1.422 m)   Weight:  Wt Readings from Last 1 Encounters:  09/18/15 124 lb 12.8 oz (56.609 kg)   Ideal Body Weight:  42.4 kg   BMI:  Body mass index is 28 kg/(m^2).  Estimated Nutritional Needs:  Kcal:  1100-1400 kcals (20-25 kcal/kg) Protein:  42-55 (1-1.3 g/kg ibw) Fluid:  1.1-1.4 liters fluid  EDUCATION NEEDS:  No education needs identified at this time  Christophe LouisNathan Karinna Beadles RD, LDN Nutrition Pager: [phone] 09/19/2015 2:25 PM

## 2015-09-19 NOTE — Progress Notes (Signed)
[facility]                                                                              Patient Demographics  Katie Mcfarland, is a 81 y.o. female, DOB - 07-21-1920, ZOX:[id number]  Admit date - 09/18/2015   Admitting Physician Inez Catalina, MD  Outpatient Primary MD for the patient is Londell Moh, MD  LOS - 1   Chief Complaint  Patient presents with  . Altered Mental Status       Brief HPI   Per Dr Criselda Peaches on 09/18/15 Katie Mcfarland is a 81yo woman, relatively healthy, with progressive dementia, HTN and HLD. She presented due to change in behavior over the last month with becoming more combative, manipulative and difficult to manage. This has gotten much worse of the last couple of days. On the day of admission, her family and caretaker reported that she has not communicated at all with any of them.She had not much to drink or eat, in ED patient was found to have vomited on herself and there was concern for aspiration.  She is DNR and her daughter in law noted that she is "ready to go" and has been since her husband died 2 years ago. She lives at home and has 24 hour caregivers. Katie Mcfarland was recently started on risperdone, but only took one dose the night before admission.  In the ED, the patient was non responsive, but responded to pain and protected her airway. She had imaging, abdominal x-ray revealed large stool burder. CT head showed no new abnormality but chronic changes, CXR showed left base opacity concerning for developing PNA. UA was clear. Labs were relatively normal, K was mildly low at 3.4. She was initiated on treatment for pneumonia.    Assessment & Plan    Principal Problem:   Pneumonia left lower lung, possibly aspiration pneumonitis versus community-acquired pneumonia - Continue IV Rocephin and Zithromax - Follow blood cultures - No lactic acidosis  Active Problems:   Acute encephalopathy with underlying Dementia with  behavioral disturbance - Patient is much more alert and awake today, appropriately conversing, stated at bedside - Hold off on trazodone, risperidone - CT head showed no evidence of acute intracranial abnormality, atrophy with chronic small vessel white matter ischemic changes and remote bilateral cerebellar infarcts - PTOT evaluation    HLD (hyperlipidemia) - Continue Zocor    HTN (hypertension) - Continue Norvasc  Hypokalemia - Replaced  Code Status: DO NOT RESUSCITATE  Family Communication: Discussed in detail with the patient, all imaging results, lab results explained to the patient. Called patient's daughter, she did not pick up so left message on the phone     Disposition Plan: DC in 24-48 hours  Time Spent in minutes25  minutes  Procedures  CT head  chest x-ray   Consults   None  DVT Prophylaxis heparin subcutaneous  Medications  Scheduled Meds: . azithromycin  500 mg Intravenous Q24H  . cefTRIAXone (ROCEPHIN)  IV  1 g Intravenous Q24H  . heparin  5,000 Units Subcutaneous 3 times per day  . latanoprost  1 drop Both Eyes QHS  . simvastatin  20 mg Oral Daily   Continuous Infusions:  PRN Meds:.hydrALAZINE, traMADol   Antibiotics   Anti-infectives    Start     Dose/Rate Route Frequency Ordered Stop   09/18/15 2300  cefTRIAXone (ROCEPHIN) 1 g in dextrose 5 % 50 mL IVPB     1 g 100 mL/hr over 30 Minutes Intravenous Every 24 hours 09/18/15 2145 09/25/15 2259   09/18/15 2300  azithromycin (ZITHROMAX) 500 mg in dextrose 5 % 250 mL IVPB     500 mg 250 mL/hr over 60 Minutes Intravenous Every 24 hours 09/18/15 2145 09/25/15 2259   09/18/15 1630  vancomycin (VANCOCIN) 1,500 mg in sodium chloride 0.9 % 500 mL IVPB     1,500 mg 250 mL/hr over 120 Minutes Intravenous  Once 09/18/15 1613 09/18/15 1945   09/18/15 1615  piperacillin-tazobactam (ZOSYN) IVPB 3.375 g     3.375 g 100 mL/hr over 30 Minutes Intravenous  Once 09/18/15 1613 09/18/15 1659         Subjective:   Katie Mcfarland was seen and examined today. Patient denies dizziness, chest pain, shortness of breath, abdominal pain, N/V/D/C, new weakness, numbess, tingling. No acute events overnight.    Objective:   Blood pressure 158/78, pulse 73, temperature 97.7 F (36.5 C), temperature source Oral, resp. rate 18, height 4\' 8"  (1.422 m), weight 56.609 kg (124 lb 12.8 oz), SpO2 93 %.  Wt Readings from Last 3 Encounters:  09/18/15 56.609 kg (124 lb 12.8 oz)     Intake/Output Summary (Last 24 hours) at 09/19/15 1234 Last data filed at 09/19/15 1012  Gross per 24 hour  Intake 899.17 ml  Output    900 ml  Net  -0.83 ml    Exam  General: Alert and oriented x 3, NAD  HEENT:  PERRLA, EOMI, Anicteric Sclera, mucous membranes moist.   Neck: Supple, no JVD, no masses  CVS: S1 S2 auscultated, no rubs, murmurs or gallops. Regular rate and rhythm.  Respiratory: Clear to auscultation bilaterally, no wheezing, rales or rhonchi  Abdomen: Soft, nontender, nondistended, + bowel sounds  Ext: no cyanosis clubbing or edema  Neuro: AAOx3, Cr N's II- XII. Strength 5/5 upper and lower extremities bilaterally  Skin: No rashes  Psych: Normal affect and demeanor, alert and oriented x3    Data Review   Micro Results Recent Results (from the past 240 hour(s))  Urine culture     Status: None (Preliminary result)   Collection Time: 09/18/15  7:28 PM  Result Value Ref Range Status   Specimen Description URINE, CATHETERIZED  Final   Special Requests NONE  Final   Culture NO GROWTH < 12 HOURS  Final   Report Status PENDING  Incomplete    Radiology Reports Dg Abd 1 View  09/18/2015  CLINICAL DATA:  Vomiting, altered mental status for 1 day EXAM: ABDOMEN - 1 VIEW COMPARISON:  02/04/2015 FINDINGS: Prior cholecystectomy. Large stool burden throughout the colon. No evidence of bowel obstruction or free air. No organomegaly. No acute bony abnormality. IMPRESSION: Large stool  burden.  No evidence of bowel obstruction or free air. Electronically Signed   By: Charlett NoseKevin  Dover M.D.   On: 09/18/2015 16:31   Ct Head Wo Contrast  09/18/2015  CLINICAL DATA:  81 year old female -altered mental status and unresponsive. EXAM: CT HEAD WITHOUT CONTRAST TECHNIQUE: Contiguous axial images were obtained from the base of the skull through the vertex without intravenous contrast. COMPARISON:  None FINDINGS: Mild atrophy and moderate chronic small-vessel white matter ischemic changes  are noted. 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 No acute intracranial abnormalities are identified, including mass lesion or mass effect, hydrocephalus, extra-axial fluid collection, midline shift, hemorrhage, or acute infarction. The visualized bony calvarium is unremarkable. IMPRESSION: No evidence of acute intracranial abnormality. Atrophy, chronic small-vessel white matter ischemic changes and remote bilateral cerebellar infarcts. Electronically Signed   By: Harmon Pier M.D.   On: 09/18/2015 17:26   Dg Chest Portable 1 View  09/18/2015  CLINICAL DATA:  Shortness of breath with altered mental status. EXAM: PORTABLE CHEST 1 VIEW COMPARISON:  December 24, 2013 FINDINGS: There is localized increased opacity in the left base. Lungs elsewhere clear. Heart size and pulmonary vascularity are normal. No adenopathy. There is thoracolumbar levoscoliosis. IMPRESSION: Increased opacity left base, concerning for early pneumonia. Lungs elsewhere clear. No change in cardiac silhouette. Followup PA and lateral chest radiographs recommended in 3-4 weeks following trial of antibiotic therapy to ensure resolution and exclude underlying malignancy. Electronically Signed   By: Bretta Bang III M.D.   On: 09/18/2015 16:07   Dg Knee Complete 4 Views Right  08/24/2015  CLINICAL DATA:  Chronic right knee pain worse after fall on 08/12/2015. EXAM: RIGHT KNEE - COMPLETE 4+ VIEW COMPARISON:  02/04/2015 FINDINGS: There is  diffuse decreased bone mineralization. There is moderate to severe tricompartmental osteoarthritic change most prominent over the medial compartment without significant change. No acute fracture or dislocation. No definite joint effusion. IMPRESSION: No acute findings. Moderate to severe tricompartmental osteoarthritic change. Electronically Signed   By: Elberta Fortis M.D.   On: 08/24/2015 21:07    CBC  Recent Labs Lab 09/18/15 1605 09/19/15 0542  WBC 9.5 9.4  HGB 15.1* 14.2  HCT 43.9 41.4  PLT 187 179  MCV 93.6 93.5  MCH 32.2 32.1  MCHC 34.4 34.3  RDW 12.9 12.9  LYMPHSABS 1.3  --   MONOABS 0.8  --   EOSABS 0.0  --   BASOSABS 0.0  --     Chemistries   Recent Labs Lab 09/18/15 1605 09/19/15 0542  NA 134* 138  K 3.4* 3.3*  CL 97* 101  CO2 26 25  GLUCOSE 143* 106*  BUN 17 13  CREATININE 0.83 0.77  CALCIUM 10.1 9.5  AST 30  --   ALT 22  --   ALKPHOS 88  --   BILITOT 1.0  --    ------------------------------------------------------------------------------------------------------------------ estimated creatinine clearance is 30.8 mL/min (by C-G formula based on Cr of 0.77). ------------------------------------------------------------------------------------------------------------------ No results for input(s): HGBA1C in the last 72 hours. ------------------------------------------------------------------------------------------------------------------ No results for input(s): CHOL, HDL, LDLCALC, TRIG, CHOLHDL, LDLDIRECT in the last 72 hours. ------------------------------------------------------------------------------------------------------------------ No results for input(s): TSH, T4TOTAL, T3FREE, THYROIDAB in the last 72 hours.  Invalid input(s): FREET3 ------------------------------------------------------------------------------------------------------------------ No results for input(s): VITAMINB12, FOLATE, FERRITIN, TIBC, IRON, RETICCTPCT in the last 72  hours.  Coagulation profile No results for input(s): INR, PROTIME in the last 168 hours.  No results for input(s): DDIMER in the last 72 hours.  Cardiac Enzymes No results for input(s): CKMB, TROPONINI, MYOGLOBIN in the last 168 hours.  Invalid input(s): CK ------------------------------------------------------------------------------------------------------------------ Invalid input(s): POCBNP  No results for input(s): GLUCAP in the last 72 hours.   Maanvi Lecompte M.D. [facility] 09/19/2015, 12:34 PM  Pager: [phone] Between 7am to 7pm - call Pager - [phone]  After 7pm go to [email or web address] - password [email or web address]  Call night coverage person covering after 7pm

## 2015-09-19 NOTE — Evaluation (Signed)
Clinical/Bedside Swallow Evaluation Patient Details  Name: Imagene RichesMildred Schillinger Veronica MRN: [id number] Date of Birth: 11-09-1920  Today's Date: 09/19/2015 Time: SLP Start Time (ACUTE ONLY): 1647 SLP Stop Time (ACUTE ONLY): 1700 SLP Time Calculation (min) (ACUTE ONLY): 13 min  Past Medical History:  Past Medical History  Diagnosis Date  . High cholesterol   . Hypertension    Past Surgical History:  Past Surgical History  Procedure Laterality Date  . Cholecystectomy     HPI:  81 yo woman with PMH:  progressive dementia, HTN and HLD admitted with change in behavior over the last month with becoming more combative, manipulative and difficult to manage.Per MD note pt found to have vomited on herself and there was concern for aspiration. CT head showed no new abnormality but chronic changes, CXR showed left base opacity concerning for developing PNA.No prior ST notes.   Assessment / Plan / Recommendation Clinical Impression  Caregiver reports "choking/coughing" if pt takes large or consecutive sips. Smaller/controlled sips significantly lessen s/s aspiration. No cough/throat clear/wet vocal quality identified during assessment over multiple trials. Mildly delayed transit with solid texture. Recommend Dys 3 texture and continue thin liquids, sit upright (typically uses cup vs straw), small single controlled sips and full supervision. No further ST needed.     Aspiration Risk   (mild-moderate)    Diet Recommendation Dysphagia 3 (Mech soft);Thin   Medication Administration: Whole meds with puree Compensations: Slow rate;Small sips/bites    Other  Recommendations Oral Care Recommendations: Oral care BID   Follow Up Recommendations       Frequency and Duration        Pertinent Vitals/Pain none    SLP Swallow Goals     Swallow Study Prior Functional Status       General Other Pertinent Information: 81 yo woman with PMH:  progressive dementia, HTN and HLD admitted with change  in behavior over the last month with becoming more combative, manipulative and difficult to manage.Per MD note pt found to have vomited on herself and there was concern for aspiration. CT head showed no new abnormality but chronic changes, CXR showed left base opacity concerning for developing PNA.No prior ST notes. Type of Study: Bedside swallow evaluation Previous Swallow Assessment:  (none) Diet Prior to this Study: Thin liquids (clear liquids) Temperature Spikes Noted: No Respiratory Status: Room air History of Recent Intubation: No Behavior/Cognition: Alert;Cooperative;Pleasant mood Oral Cavity - Dentition:  (majority of natural dentition) Self-Feeding Abilities: Able to feed self Patient Positioning: Upright in bed Baseline Vocal Quality: Normal Volitional Cough: Strong Volitional Swallow: Able to elicit    Oral/Motor/Sensory Function Overall Oral Motor/Sensory Function: Appears within functional limits for tasks assessed   Ice Chips Ice chips: Not tested   Thin Liquid Thin Liquid: Within functional limits Presentation: Cup;Straw    Nectar Thick Nectar Thick Liquid: Not tested   Honey Thick Honey Thick Liquid: Not tested   Puree Puree: Within functional limits   Solid   GO    Solid: Impaired Oral Phase Impairments: Reduced lingual movement/coordination Oral Phase Functional Implications:  (mildly delayed transit)       Majesta Leichter, Breck CoonsLisa Willis 09/19/2015,6:32 PM  Breck CoonsLisa Willis Lonell FaceLitaker M.Ed [employer] Pager [phone]

## 2015-09-20 LAB — BASIC METABOLIC PANEL
ANION GAP: 10 (ref 5–15)
BUN: 9 mg/dL (ref 6–20)
CALCIUM: 10.2 mg/dL (ref 8.9–10.3)
CO2: 26 mmol/L (ref 22–32)
Chloride: 100 mmol/L — ABNORMAL LOW (ref 101–111)
Creatinine, Ser: 0.68 mg/dL (ref 0.44–1.00)
GLUCOSE: 133 mg/dL — AB (ref 65–99)
POTASSIUM: 3.3 mmol/L — AB (ref 3.5–5.1)
SODIUM: 136 mmol/L (ref 135–145)

## 2015-09-20 LAB — CBC
HCT: 42.1 % (ref 36.0–46.0)
Hemoglobin: 14.5 g/dL (ref 12.0–15.0)
MCH: 31.8 pg (ref 26.0–34.0)
MCHC: 34.4 g/dL (ref 30.0–36.0)
MCV: 92.3 fL (ref 78.0–100.0)
PLATELETS: 202 10*3/uL (ref 150–400)
RBC: 4.56 MIL/uL (ref 3.87–5.11)
RDW: 12.9 % (ref 11.5–15.5)
WBC: 9.4 10*3/uL (ref 4.0–10.5)

## 2015-09-20 LAB — URINE CULTURE: Culture: NO GROWTH

## 2015-09-20 MED ORDER — TRAZODONE HCL 50 MG PO TABS: 50.0000 mg | ORAL_TABLET | Freq: Every evening | ORAL | Status: DC | PRN

## 2015-09-20 MED ORDER — CEFUROXIME AXETIL 500 MG PO TABS: 500.0000 mg | ORAL_TABLET | Freq: Two times a day (BID) | ORAL | Status: DC

## 2015-09-20 MED ORDER — AZITHROMYCIN 500 MG PO TABS: 500.0000 mg | ORAL_TABLET | Freq: Every day | ORAL | Status: DC

## 2015-09-20 MED ORDER — POTASSIUM CHLORIDE CRYS ER 20 MEQ PO TBCR
40.0000 meq | EXTENDED_RELEASE_TABLET | Freq: Once | ORAL | Status: AC
  Administered 2015-09-20: 40 meq via ORAL
  Filled 2015-09-20: qty 2

## 2015-09-20 MED ORDER — HYDRALAZINE HCL 20 MG/ML IJ SOLN
5.0000 mg | Freq: Once | INTRAMUSCULAR | Status: AC
  Administered 2015-09-20: 5 mg via INTRAVENOUS
  Filled 2015-09-20: qty 1

## 2015-09-20 MED ORDER — AZITHROMYCIN 200 MG/5ML PO SUSR: 500.0000 mg | Freq: Every day | ORAL | Status: DC

## 2015-09-20 MED ORDER — CEFUROXIME AXETIL 500 MG PO TABS
500.0000 mg | ORAL_TABLET | Freq: Two times a day (BID) | ORAL | Status: DC
  Administered 2015-09-20: 500 mg via ORAL
  Filled 2015-09-20 (×2): qty 1

## 2015-09-20 MED ORDER — CEFUROXIME AXETIL 250 MG/5ML PO SUSR: 500.0000 mg | Freq: Two times a day (BID) | ORAL | Status: DC

## 2015-09-20 MED ORDER — AZITHROMYCIN 500 MG PO TABS
500.0000 mg | ORAL_TABLET | ORAL | Status: DC
  Administered 2015-09-20: 500 mg via ORAL
  Filled 2015-09-20: qty 1

## 2015-09-20 NOTE — Progress Notes (Signed)
[facility]                                                                              Patient Demographics  Katie Mcfarland, is a 81 y.o. female, DOB - January 10, 1920, ZOX:[id number]  Admit date - 09/18/2015   Admitting Physician Inez Catalina, MD  Outpatient Primary MD for the patient is Londell Moh, MD  LOS - 2   Chief Complaint  Patient presents with  . Altered Mental Status       Brief HPI   Per Dr Criselda Peaches on 09/18/15 Katie Mcfarland is a 81yo woman, relatively healthy, with progressive dementia, HTN and HLD. She presented due to change in behavior over the last month with becoming more combative, manipulative and difficult to manage. This has gotten much worse of the last couple of days. On the day of admission, her family and caretaker reported that she has not communicated at all with any of them.She had not much to drink or eat, in ED patient was found to have vomited on herself and there was concern for aspiration.  She is DNR and her daughter in law noted that she is "ready to go" and has been since her husband died 2 years ago. She lives at home and has 24 hour caregivers. Ms. Artus was recently started on risperdone, but only took one dose the night before admission.  In the ED, the patient was non responsive, but responded to pain and protected her airway. She had imaging, abdominal x-ray revealed large stool burder. CT head showed no new abnormality but chronic changes, CXR showed left base opacity concerning for developing PNA. UA was clear. Labs were relatively normal, K was mildly low at 3.4. She was initiated on treatment for pneumonia.    Assessment & Plan    Principal Problem:   Pneumonia left lower lung, possibly aspiration pneumonitis versus community-acquired pneumonia- improving, afebrile, no leukocytosis - Continue IV Rocephin and Zithromax -Blood cultures negative so far, HIV negative, urine strep pneumo antigen negative - No  lactic acidosis Swallow evaluation done, recommended dysphagia 3 diet-   Active Problems:   Acute encephalopathy with underlying Dementia with behavioral disturbance - Patient is much more alert and awake,appropriately conversing, stated at bedside, at her baseline mental status  - Hold off on trazodone, risperidone - CT head showed no evidence of acute intracranial abnormality, atrophy with chronic small vessel white matter ischemic changes and remote bilateral cerebellar infarcts - PTOT evaluation pending    HLD (hyperlipidemia) - Continue Zocor    HTN (hypertension) - Continue Norvasc  Hypokalemia - Replaced  Code Status: DO NOT RESUSCITATE  Family Communication: Discussed in detail with the patient, all imaging results, lab results explained to the patient and care giver.    Disposition Plan: Hopefully DC home tomorrow, PT evaluation pending  Time Spent in minutes25  minutes  Procedures  CT head  chest x-ray   Consults   None  DVT Prophylaxis heparin subcutaneous  Medications  Scheduled Meds: . amLODipine  5 mg Oral Daily  . azithromycin  500 mg Intravenous Q24H  . cefTRIAXone (ROCEPHIN)  IV  1 g Intravenous  Q24H  . feeding supplement  1 Container Oral BID BM  . heparin  5,000 Units Subcutaneous 3 times per day  . latanoprost  1 drop Both Eyes QHS  . simvastatin  20 mg Oral Daily   Continuous Infusions:  PRN Meds:.acetaminophen, hydrALAZINE, traMADol   Antibiotics   Anti-infectives    Start     Dose/Rate Route Frequency Ordered Stop   09/18/15 2300  cefTRIAXone (ROCEPHIN) 1 g in dextrose 5 % 50 mL IVPB     1 g 100 mL/hr over 30 Minutes Intravenous Every 24 hours 09/18/15 2145 09/25/15 2259   09/18/15 2300  azithromycin (ZITHROMAX) 500 mg in dextrose 5 % 250 mL IVPB     500 mg 250 mL/hr over 60 Minutes Intravenous Every 24 hours 09/18/15 2145 09/25/15 2259   09/18/15 1630  vancomycin (VANCOCIN) 1,500 mg in sodium chloride 0.9 % 500 mL IVPB     1,500  mg 250 mL/hr over 120 Minutes Intravenous  Once 09/18/15 1613 09/18/15 1945   09/18/15 1615  piperacillin-tazobactam (ZOSYN) IVPB 3.375 g     3.375 g 100 mL/hr over 30 Minutes Intravenous  Once 09/18/15 1613 09/18/15 1659        Subjective:   Katie Mcfarland was seen and examined today.  alert and oriented, in baseline mental status, caregiver at the bedside. No acute issues overnight. No fevers or chills. Patient denies dizziness, chest pain, shortness of breath, abdominal pain, N/V/D/C, new weakness, numbess, tingling  Objective:   Blood pressure 164/56, pulse 79, temperature 97.7 F (36.5 C), temperature source Oral, resp. rate 17, height 4\' 8"  (1.422 m), weight 56.609 kg (124 lb 12.8 oz), SpO2 95 %.  Wt Readings from Last 3 Encounters:  09/18/15 56.609 kg (124 lb 12.8 oz)     Intake/Output Summary (Last 24 hours) at 09/20/15 1051 Last data filed at 09/20/15 0930  Gross per 24 hour  Intake    660 ml  Output    200 ml  Net    460 ml    Exam  General: Alert and oriented x 3, NAD  HEENT:  PERRLA, EOMI, Anicteric Sclera  Neck: Supple, no JVD, no masses  CVS: S1 S2 clear, RRR  Respiratory: CTAB  Abdomen: Soft, nontender, nondistended, + bowel sounds  Ext: no cyanosis clubbing or edema  Neuro: no new deficits  Skin: No rashes  Psych: Normal affect and demeanor, alert and oriented x3    Data Review   Micro Results Recent Results (from the past 240 hour(s))  Blood culture (routine x 2)     Status: None (Preliminary result)   Collection Time: 09/18/15  4:05 PM  Result Value Ref Range Status   Specimen Description BLOOD RIGHT ARM  Final   Special Requests BOTTLES DRAWN AEROBIC AND ANAEROBIC 5CC  Final   Culture NO GROWTH < 24 HOURS  Final   Report Status PENDING  Incomplete  Blood culture (routine x 2)     Status: None (Preliminary result)   Collection Time: 09/18/15  4:13 PM  Result Value Ref Range Status   Specimen Description BLOOD LEFT FOREARM  Final    Special Requests BOTTLES DRAWN AEROBIC AND ANAEROBIC 5CC  Final   Culture NO GROWTH < 24 HOURS  Final   Report Status PENDING  Incomplete  Urine culture     Status: None (Preliminary result)   Collection Time: 09/18/15  7:28 PM  Result Value Ref Range Status   Specimen Description URINE, CATHETERIZED  Final  Special Requests NONE  Final   Culture NO GROWTH < 12 HOURS  Final   Report Status PENDING  Incomplete  Culture, blood (routine x 2) Call MD if unable to obtain prior to antibiotics being given     Status: None (Preliminary result)   Collection Time: 09/18/15 10:40 PM  Result Value Ref Range Status   Specimen Description BLOOD LEFT HAND  Final   Special Requests BOTTLES DRAWN AEROBIC AND ANAEROBIC 5CC EACH  Final   Culture NO GROWTH < 24 HOURS  Final   Report Status PENDING  Incomplete  Culture, blood (routine x 2) Call MD if unable to obtain prior to antibiotics being given     Status: None (Preliminary result)   Collection Time: 09/18/15 10:46 PM  Result Value Ref Range Status   Specimen Description BLOOD RIGHT HAND  Final   Special Requests BOTTLES DRAWN AEROBIC AND ANAEROBIC 5CC EACH  Final   Culture NO GROWTH < 24 HOURS  Final   Report Status PENDING  Incomplete    Radiology Reports Dg Abd 1 View  09/18/2015  CLINICAL DATA:  Vomiting, altered mental status for 1 day EXAM: ABDOMEN - 1 VIEW COMPARISON:  02/04/2015 FINDINGS: Prior cholecystectomy. Large stool burden throughout the colon. No evidence of bowel obstruction or free air. No organomegaly. No acute bony abnormality. IMPRESSION: Large stool burden.  No evidence of bowel obstruction or free air. Electronically Signed   By: Charlett Nose M.D.   On: 09/18/2015 16:31   Ct Head Wo Contrast  09/18/2015  CLINICAL DATA:  81 year old female -altered mental status and unresponsive. EXAM: CT HEAD WITHOUT CONTRAST TECHNIQUE: Contiguous axial images were obtained from the base of the skull through the vertex without intravenous  contrast. COMPARISON:  None FINDINGS: Mild atrophy and moderate chronic small-vessel white matter ischemic changes are noted. 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 No acute intracranial abnormalities are identified, including mass lesion or mass effect, hydrocephalus, extra-axial fluid collection, midline shift, hemorrhage, or acute infarction. The visualized bony calvarium is unremarkable. IMPRESSION: No evidence of acute intracranial abnormality. Atrophy, chronic small-vessel white matter ischemic changes and remote bilateral cerebellar infarcts. Electronically Signed   By: Harmon Pier M.D.   On: 09/18/2015 17:26   Dg Chest Portable 1 View  09/18/2015  CLINICAL DATA:  Shortness of breath with altered mental status. EXAM: PORTABLE CHEST 1 VIEW COMPARISON:  December 24, 2013 FINDINGS: There is localized increased opacity in the left base. Lungs elsewhere clear. Heart size and pulmonary vascularity are normal. No adenopathy. There is thoracolumbar levoscoliosis. IMPRESSION: Increased opacity left base, concerning for early pneumonia. Lungs elsewhere clear. No change in cardiac silhouette. Followup PA and lateral chest radiographs recommended in 3-4 weeks following trial of antibiotic therapy to ensure resolution and exclude underlying malignancy. Electronically Signed   By: Bretta Bang III M.D.   On: 09/18/2015 16:07   Dg Knee Complete 4 Views Right  08/24/2015  CLINICAL DATA:  Chronic right knee pain worse after fall on 08/12/2015. EXAM: RIGHT KNEE - COMPLETE 4+ VIEW COMPARISON:  02/04/2015 FINDINGS: There is diffuse decreased bone mineralization. There is moderate to severe tricompartmental osteoarthritic change most prominent over the medial compartment without significant change. No acute fracture or dislocation. No definite joint effusion. IMPRESSION: No acute findings. Moderate to severe tricompartmental osteoarthritic change. Electronically Signed   By: Elberta Fortis M.D.   On:  08/24/2015 21:07    CBC  Recent Labs Lab 09/18/15 1605 09/19/15 0542 09/20/15 0653  WBC 9.5  9.4 9.4  HGB 15.1* 14.2 14.5  HCT 43.9 41.4 42.1  PLT 187 179 202  MCV 93.6 93.5 92.3  MCH 32.2 32.1 31.8  MCHC 34.4 34.3 34.4  RDW 12.9 12.9 12.9  LYMPHSABS 1.3  --   --   MONOABS 0.8  --   --   EOSABS 0.0  --   --   BASOSABS 0.0  --   --     Chemistries   Recent Labs Lab 09/18/15 1605 09/19/15 0542 09/20/15 0653  NA 134* 138 136  K 3.4* 3.3* 3.3*  CL 97* 101 100*  CO2 GLUCOSE 143* 106* 133*  BUN CREATININE 0.83 0.77 0.68  CALCIUM 10.1 9.5 10.2  AST 30  --   --   ALT 22  --   --   ALKPHOS 88  --   --   BILITOT 1.0  --   --    ------------------------------------------------------------------------------------------------------------------ estimated creatinine clearance is 30.8 mL/min (by C-G formula based on Cr of 0.68). ------------------------------------------------------------------------------------------------------------------ No results for input(s): HGBA1C in the last 72 hours. ------------------------------------------------------------------------------------------------------------------ No results for input(s): CHOL, HDL, LDLCALC, TRIG, CHOLHDL, LDLDIRECT in the last 72 hours. ------------------------------------------------------------------------------------------------------------------ No results for input(s): TSH, T4TOTAL, T3FREE, THYROIDAB in the last 72 hours.  Invalid input(s): FREET3 ------------------------------------------------------------------------------------------------------------------ No results for input(s): VITAMINB12, FOLATE, FERRITIN, TIBC, IRON, RETICCTPCT in the last 72 hours.  Coagulation profile No results for input(s): INR, PROTIME in the last 168 hours.  No results for input(s): DDIMER in the last 72 hours.  Cardiac Enzymes No results for input(s): CKMB, TROPONINI, MYOGLOBIN in the last 168  hours.  Invalid input(s): CK ------------------------------------------------------------------------------------------------------------------ Invalid input(s): POCBNP  No results for input(s): GLUCAP in the last 72 hours.   RAI,RIPUDEEP M.D. [facility] 09/20/2015, 10:51 AM  Pager: [phone] Between 7am to 7pm - call Pager - [phone]  After 7pm go to [email or web address] - password [email or web address]  Call night coverage person covering after 7pm

## 2015-09-20 NOTE — Progress Notes (Signed)
Utilization Review Completed.Katie Mcfarland T10/16/2016  

## 2015-09-20 NOTE — Discharge Summary (Signed)
Physician Discharge Summary   Patient ID: Katie Mcfarland MRN: [id number] DOB/AGE: 79-Apr-1921 79 y.o.  Admit date: 09/18/2015 Discharge date: 09/20/2015  Primary Care Physician:  Katie Moh, MD  Discharge Diagnoses:   . Acute encephalopathy- resolved  . Pneumonia . Dementia with behavioral disturbance . HLD (hyperlipidemia) . HTN (hypertension)   Consults: None   Recommendations for Outpatient Follow-up:  Please note trazodone has been changed to at bedtime as needed for sleep as patient is also on risperidone at the same time  Hydrocodone has been discontinued.  Swallow evaluation was done and recommended mechanical soft diet with thin liquids  TESTS THAT NEED FOLLOW-UP Please check chest x-ray in 3 weeks to ensure complete resolution of pneumonia   DIET: Dysphagia 3, mechanical soft diet    Allergies:  No Known Allergies   Discharge Medications:   Medication List    STOP taking these medications        HYDROcodone-acetaminophen 5-325 MG tablet  Commonly known as:  NORCO/VICODIN      TAKE these medications        amLODipine 5 MG tablet  Commonly known as:  NORVASC  Take 5 mg by mouth daily.     azithromycin 500 MG tablet  Commonly known as:  ZITHROMAX  Take 1 tablet (500 mg total) by mouth daily. X 1 week     cefUROXime 500 MG tablet  Commonly known as:  CEFTIN  Take 1 tablet (500 mg total) by mouth 2 (two) times daily with a meal. X 1 week     CENTRUM ADULTS PO  Take 1 tablet by mouth daily.     indapamide 2.5 MG tablet  Commonly known as:  LOZOL  Take 2.5 mg by mouth daily.     latanoprost 0.005 % ophthalmic solution  Commonly known as:  XALATAN  Place 1 drop into both eyes at bedtime.     meloxicam 7.5 MG tablet  Commonly known as:  MOBIC  Take 7.5 mg by mouth daily.     risperiDONE 0.5 MG tablet  Commonly known as:  RISPERDAL  Take 0.5 mg by mouth at bedtime.     simvastatin 20 MG tablet  Commonly known as:   ZOCOR  Take 20 mg by mouth daily.     traMADol 50 MG tablet  Commonly known as:  ULTRAM  Take 50 mg by mouth every 6 (six) hours as needed for moderate pain.     traZODone 50 MG tablet  Commonly known as:  DESYREL  Take 1 tablet (50 mg total) by mouth at bedtime as needed for sleep.     VITAMIN D-3 PO  Take 1 tablet by mouth daily.         Brief H and P: For complete details please refer to admission H and P, but in brief Per Dr Criselda Peaches on 09/18/15 Katie Mcfarland is a 81yo woman, relatively healthy, with progressive dementia, HTN and HLD. She presented due to change in behavior over the last month with becoming more combative, manipulative and difficult to manage. This has gotten much worse of the last couple of days. On the day of admission, her family and caretaker reported that she has not communicated at all with any of them.She had not much to drink or eat, in ED patient was found to have vomited on herself and there was concern for aspiration. She is DNR and her daughter in law noted that she is "ready to go" and has been since her husband  died 2 years ago. She lives at home and has 24 hour caregivers. Katie Mcfarland was recently started on risperdone, but only took one dose the night before admission.  In the ED, the patient was non responsive, but responded to pain and protected her airway. She had imaging, abdominal x-ray revealed large stool burder. CT head showed no new abnormality but chronic changes, CXR showed left base opacity concerning for developing PNA. UA was clear. Labs were relatively normal, K was mildly low at 3.4. She was initiated on treatment for pneumonia.    Hospital Course:   Pneumonia left lower lung, possibly aspiration pneumonitis versus community-acquired pneumonia- improving, afebrile, no leukocytosis -Patient was placed on IV Zithromax and Rocephin. Blood cultures remained negative so far. Blood cultures negative so far, HIV negative, urine strep  pneumo antigen negative. Patient is afebrile, no leukocytosis, No lactic acidosis - Swallow evaluation done, recommended dysphagia 3 diet - Patient was transitioned to oral Zithromax and Ceftin for 7 days.     Acute encephalopathy with underlying Dementia with behavioral disturbance - Patient is much more alert and awake,appropriately conversing, at her baseline mental status  -CT head showed no evidence of acute intracranial abnormality, atrophy with chronic small vessel white matter ischemic changes and remote bilateral cerebellar infarcts - Per staff, patient is ambulating at the baseline - Per her caregivers at the bedside, patient is back to her baseline mental status. I strongly recommend to discontinue narcotics, change trazodone to as needed at bedtime for insomnia as patient is also on risperidone at bedtime.   HLD (hyperlipidemia) - Continue Zocor   HTN (hypertension) - Continue Norvasc  Hypokalemia - Replaced   Day of Discharge BP 164/56 mmHg  Pulse 79  Temp(Src) 97.7 F (36.5 C) (Oral)  Resp 17  Ht 4\' 8"  (1.422 m)  Wt 56.609 kg (124 lb 12.8 oz)  BMI 28.00 kg/m2  SpO2 95%  Physical Exam: General: Alert and awake oriented x3 not in any acute distress. HEENT: anicteric sclera, pupils reactive to light and accommodation CVS: S1-S2 clear no murmur rubs or gallops Chest: clear to auscultation bilaterally, no wheezing rales or rhonchi Abdomen: soft nontender, nondistended, normal bowel sounds Extremities: no cyanosis, clubbing or edema noted bilaterally Neuro: Cranial nerves II-XII intact, no focal neurological deficits   The results of significant diagnostics from this hospitalization (including imaging, microbiology, ancillary and laboratory) are listed below for reference.    LAB RESULTS: Basic Metabolic Panel:  Recent Labs Lab 09/19/15 0542 09/20/15 0653  NA 138 136  K 3.3* 3.3*  CL 101 100*  CO2 25 26  GLUCOSE 106* 133*  BUN 13 9  CREATININE 0.77  0.68  CALCIUM 9.5 10.2   Liver Function Tests:  Recent Labs Lab 09/18/15 1605  AST 30  ALT 22  ALKPHOS 88  BILITOT 1.0  PROT 6.9  ALBUMIN 3.9    Recent Labs Lab 09/18/15 1605  LIPASE 19*   No results for input(s): AMMONIA in the last 168 hours. CBC:  Recent Labs Lab 09/18/15 1605 09/19/15 0542 09/20/15 0653  WBC 9.5 9.4 9.4  NEUTROABS 7.5  --   --   HGB 15.1* 14.2 14.5  HCT 43.9 41.4 42.1  MCV 93.6 93.5 92.3  PLT 187 179 202   Cardiac Enzymes:  Recent Labs Lab 09/19/15 0542  CKTOTAL 26*   BNP: Invalid input(s): POCBNP CBG: No results for input(s): GLUCAP in the last 168 hours.  Significant Diagnostic Studies:  Dg Abd 1 View  09/18/2015  CLINICAL DATA:  Vomiting, altered mental status for 1 day EXAM: ABDOMEN - 1 VIEW COMPARISON:  02/04/2015 FINDINGS: Prior cholecystectomy. Large stool burden throughout the colon. No evidence of bowel obstruction or free air. No organomegaly. No acute bony abnormality. IMPRESSION: Large stool burden.  No evidence of bowel obstruction or free air. Electronically Signed   By: Charlett Nose M.D.   On: 09/18/2015 16:31   Ct Head Wo Contrast  09/18/2015  CLINICAL DATA:  81 year old female -altered mental status and unresponsive. EXAM: CT HEAD WITHOUT CONTRAST TECHNIQUE: Contiguous axial images were obtained from the base of the skull through the vertex without intravenous contrast. COMPARISON:  None FINDINGS: Mild atrophy and moderate chronic small-vessel white matter ischemic changes are noted. 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 No acute intracranial abnormalities are identified, including mass lesion or mass effect, hydrocephalus, extra-axial fluid collection, midline shift, hemorrhage, or acute infarction. The visualized bony calvarium is unremarkable. IMPRESSION: No evidence of acute intracranial abnormality. Atrophy, chronic small-vessel white matter ischemic changes and remote bilateral cerebellar infarcts.  Electronically Signed   By: Harmon Pier M.D.   On: 09/18/2015 17:26   Dg Chest Portable 1 View  09/18/2015  CLINICAL DATA:  Shortness of breath with altered mental status. EXAM: PORTABLE CHEST 1 VIEW COMPARISON:  December 24, 2013 FINDINGS: There is localized increased opacity in the left base. Lungs elsewhere clear. Heart size and pulmonary vascularity are normal. No adenopathy. There is thoracolumbar levoscoliosis. IMPRESSION: Increased opacity left base, concerning for early pneumonia. Lungs elsewhere clear. No change in cardiac silhouette. Followup PA and lateral chest radiographs recommended in 3-4 weeks following trial of antibiotic therapy to ensure resolution and exclude underlying malignancy. Electronically Signed   By: Bretta Bang III M.D.   On: 09/18/2015 16:07    2D ECHO:   Disposition and Follow-up: Discharge Instructions    Discharge instructions    Complete by:  As directed   Diet: Mechanical soft with thin liquids. Medication in pure (applesauce etc).     Increase activity slowly    Complete by:  As directed             DISPOSITION: Home   DISCHARGE FOLLOW-UP Follow-up Information    Follow up with Katie Moh, MD. Schedule an appointment as soon as possible for a visit in 10 days.   Specialty:  Internal Medicine   Why:  for hospital follow-up   Contact information:   [address] [place] [address] [phone]        Time spent on Discharge: 35 minutes  Signed:   Cherysh Epperly M.D. [facility] 09/20/2015, 1:40 PM Pager: [phone]

## 2015-09-20 NOTE — Progress Notes (Signed)
Reviewed discharge paperwork with POA and prescriptions given to POA.  Pt taken via [facility] to home.  PIVs removed.  Pt and family denied any other needs at this time.

## 2015-09-20 NOTE — Progress Notes (Signed)
Pt ambulated in hallway with nurse tech.  Oxygen saturation maintained at 92% on room air.  Per nurse tech, pt shaky, but able to walk.  Spoke with daughter-in-law about patient's ability at home, stated "About a month ago, she wasn't even walking so this is an improvement.  She has 24/7 care in the home already so there will be someone with her at all times."  Will make Dr. Isidoro Donningai aware. Will continue to monitor.

## 2015-09-20 NOTE — Discharge Instructions (Signed)
If possible, crush medications and give mixed with applesauce or pudding.  Encourage patient to sit upright and give small spoonfuls.   Dysphagia Diet Level 2, Mechanically Altered The dysphagia level 2 diet includes foods that are blended, chopped, ground, or mashed so they are easier to chew and swallow. The foods are soft, moist, and can be chopped into -inch chunks (such as pancakes, pasta, and bananas). In order to be on this diet, you must be able to chew. This diet helps you transition between the pureed textures of the dysphagia level 1 diet to more solid textures. This diet is helpful for people with mild to moderate swallowing difficulties. It reduces the risk of food getting caught in the windpipe, trachea, or lungs.  You may need help or supervision during meals while following this diet so that you eat safely. You will be on this diet until your health care provider advances the texture of your diet.  WHAT DO I NEED TO KNOW ABOUT THIS DIET? Foods  You may eat foods that are soft and moist.  You may need to use a blender, whisk, or masher to soften some of your foods.  You can moisten foods with gravies, sauces, vegetable or fruit juice, milk, half and half, or water when blending, mashing, or grinding your foods to the right consistency.  If you were on the dysphagia level 1 diet, you may still eat any of the foods included in that diet.  Avoid foods that are dry, hard, sticky, chewy, coarse, and crunchy. Also avoid large cuts of food.  Take small bites. Each bite should contain  inch or less of food. Liquids  Avoid liquids with seeds and chunks.  Thicken liquids, if instructed by your health care provider. Your health care provider will tell you the consistency to which you should thicken your liquids for safe swallowing. To thicken a liquid, use a commercial thickener or a thickening food (such as rice cereal or potato flakes). Ask your health care provider for specific  recommendations on thickeners. See your dietitian or health care provider regularly for help with your dietary changes. WHAT FOODS CAN I EAT? Grains Store-bought soft breads that do not have nuts or seeds. Pancakes, sweet rolls, [place] pastries, and [place] toast that have been moistened with syrup or sauce to form a slurry when blended. Well-cooked pasta, noodles, and bread dressing. Well-cooked noodles and pasta in sauce. Moist macaroni and cheese. Soft dumplings or spaetzle with gravy or butter. Cooked cereals (including oatmeal). Low-texture dry cereals, such as rice puff, corn, or wheat-flake cereals, with milk (if thin liquids are not allowed, make sure all of the milk is absorbed by the cereal before eating it). Vegetables Very soft, well-cooked vegetables in pieces less than  inch in size. Cooked potatoes that are moist, not crispy, and with sauce. Fruits Canned or cooked fruits that are soft or moist and do not have skin or seeds. Fresh, soft bananas. Fruit juices with a small amount of pulp (if thin liquids are allowed). Gelatin or plain gelatin with canned fruit, except pineapple. Meat and Other Protein Sources Tender, moist meats, poultry, or fish cooked with gravy or sauce and cubed to -inch bites or smaller. Ground meat. Moist meatball or meatloaf. Fish without bones. Moist casseroles without rice. Tuna, egg, or meat salad without chunks or hard-to-chew vegetables, such as celery and onions. Smooth quiche without large chunks. Scrambled, poached, or soft-cooked eggs with butter, margarine, sauce, or gravy. Tofu. Well-cooked, moistened and mashed  beans, peas, baked beans, and other legumes. Casseroles without rice (such as tuna noodle casserole or soft moist meat lasagna). Dairy Cream cheese. Yogurt. Cottage cheese. Ask your health care provider if milk is allowed. Sweets/Desserts Pudding. Custard. Soft fruit pies with crust on the bottom only. Crisps and cobblers without seeds or nuts  and with soft crusts. Soft, moist cakes. Icing. Pre-gelled cookies. Soft, moist cookies dunked in milk, coffee, or another liquid. Jelly. Soft, smooth chocolate bars that are easily chewed. Jams and preserves without seeds. Ask your health care provider whether you can have frozen desserts. Fats and Oils Butter. Margarine. Cream for cereal, depending on liquid consistency allowed. Gravy. Cream sauces. Mayonnaise. Salad dressings. Cream cheese. Cheese spreads, plain or with soft fruits or vegetables added. Sour cream. Sour cream dips with soft fruits or vegetables added. Whipped toppings. Other Sauces and salsas that have soft chunks that are about  inch or smaller. The items listed above may not be a complete list of recommended foods or beverages. Contact your dietitian for more options. WHAT FOODS ARE NOT RECOMMENDED? Grains All breads not listed in the recommended list. Breads that are hard or have nuts or seeds. Coarse cereals. Cereals that have nuts, seeds, dried fruits, or coconut. Rice. Corn. Vegetables Whole, raw, frozen, or dried vegetables. Tough, fibrous, chewy, or stringy cooked vegetables, such as celery, peas, broccoli, cabbage, Brussels sprouts, and asparagus. Potato skins. Potato and other vegetable chips. Fried or French-fried potatoes. Cooked corn and peas. Fruits Whole raw, frozen, or dried fruits, including coconut. Pineapple. Fruits with seeds. Meat and Other Protein Sources Dry, tough meats, such as bacon, sausage, and hot dogs. Cheese slices and cubes. Peanut butter. Hard boiled or fried eggs. Nuts. Seeds. Pizza. Sandwiches. Dry casseroles or casseroles with rice or large chunks. Dairy Yogurt with nuts, seeds, or large chunks. Sweets/Desserts Coarse, hard, chewy, or sticky desserts. Any dessert with nuts, seeds, coconut, pineapple, or dried fruit. Ask your health care provider whether you can have frozen desserts. Fats and Oils Avoid fats with chunky, large textures, such  as those with nuts or fruits. Other Soups and casseroles with large chunks. The items listed above may not be a complete list of foods and beverages to avoid. Contact your dietitian for more information.   This information is not intended to replace advice given to you by your health care provider. Make sure you discuss any questions you have with your health care provider.   Document Released: 11/21/2005 Document Revised: 12/12/2014 Document Reviewed: 11/04/2013 Elsevier Interactive Patient Education 2016 Elsevier Inc[employer]  Aspiration Pneumonia Aspiration pneumonia is an infection in your lungs. It occurs when food, liquid, or stomach contents (vomit) are inhaled (aspirated) into your lungs. When these things get into your lungs, swelling (inflammation) and infection can occur. This can make it difficult for you to breathe. Aspiration pneumonia is a serious condition and can be life threatening. RISK FACTORS Aspiration pneumonia is more likely to occur when a person's cough (gag) reflex or ability to swallow has been decreased. Some things that can do this include:   Having a brain injury or disease, such as stroke, seizures, Parkinson's disease, dementia, or amyotrophic lateral sclerosis (ALS).   Being given general anesthetic for procedures.   Being in a coma (unconscious).   Having a narrowing of the tube that carries food to the stomach (esophagus).   Drinking too much alcohol. If a person passes out and vomits, vomit can be swallowed into the lungs.   Taking certain  medicines, such as tranquilizers or sedatives.  SIGNS AND SYMPTOMS   Coughing after swallowing food or liquids.   Breathing problems, such as wheezing or shortness of breath.   Bluish skin. This can be caused by lack of oxygen.   Coughing up food or mucus. The mucus might contain blood, greenish material, or yellowish-white fluid (pus).   Fever.   Chest pain.   Being more tired than usual (fatigue).    Sweating more than usual.   Bad breath.  DIAGNOSIS  A physical exam will be done. During the exam, the health care provider will listen to your lungs with a stethoscope to check for:   Crackling sounds in the lungs.  Decreased breath sounds.  A rapid heartbeat. Various tests may be ordered. These may include:   Chest X-ray.   CT scan.   Swallowing study. This test looks at how food is swallowed and whether it goes into your breathing tube (trachea) or food pipe (esophagus).   Sputum culture. Saliva and mucus (sputum) are collected from the lungs or the tubes that carry air to the lungs (bronchi). The sputum is then tested for bacteria.   Bronchoscopy. This test uses a flexible tube (bronchoscope) to see inside the lungs. TREATMENT  Treatment will usually include antibiotic medicines. Other medicines may also be used to reduce fever or pain. You may need to be treated in the hospital. In the hospital, your breathing will be carefully monitored. Depending on how well you are breathing, you may need to be given oxygen, or you may need breathing support from a breathing machine (ventilator). For people who fail a swallowing study, a feeding tube might be placed in the stomach, or they may be asked to avoid certain food textures or liquids when they eat. HOME CARE INSTRUCTIONS   Carefully follow any special eating instructions you were given, such as avoiding certain food textures or thickening liquids. This reduces the risk of developing aspiration pneumonia again.  Only take over-the-counter or prescription medicines as directed by your health care provider. Follow the directions carefully.   If you were prescribed antibiotics, take them as directed. Finish them even if you start to feel better.   Rest as instructed by your health care provider.   Keep all follow-up appointments with your health care provider.  SEEK MEDICAL CARE IF:   You develop worsening shortness  of breath, wheezing, or difficulty breathing.   You develop a fever.   You have chest pain.  MAKE SURE YOU:   Understand these instructions.  Will watch your condition.  Will get help right away if you are not doing well or get worse.   This information is not intended to replace advice given to you by your health care provider. Make sure you discuss any questions you have with your health care provider.   Document Released: 09/18/2009 Document Revised: 11/26/2013 Document Reviewed: 05/09/2013 Elsevier Interactive Patient Education [employer]

## 2015-09-20 NOTE — Progress Notes (Signed)
Attempted to give patient ceftin whole with pudding.  Pt tried to remove pill while swallowing pudding, then, with encouragement from daughter-in-law, attempted to swallow medication with tea.  Pt began to cough and gag, but was finally able to swallow medication.  Soon afterwards, pt vomited.  Allowed pt to rest then talked to pt's POA.  Recommended large pills be crushed and placed in applesauce.  Pt tolerated taking medications after pills crushed in applesauce.  POA stated "I don't have anything at home to crush medications.  Is it possible to have the doctor change to liquid?"  Called and spoke with Dr. Isidoro Donningai, who will change medications.  Will continue to monitor patient.

## 2015-09-21 LAB — LEGIONELLA PNEUMOPHILA SEROGP 1 UR AG: L. PNEUMOPHILA SEROGP 1 UR AG: NEGATIVE

## 2015-09-22 DIAGNOSIS — E876 Hypokalemia: Secondary | ICD-10-CM | POA: Diagnosis not present

## 2015-09-22 DIAGNOSIS — J189 Pneumonia, unspecified organism: Secondary | ICD-10-CM | POA: Diagnosis not present

## 2015-09-23 LAB — CULTURE, BLOOD (ROUTINE X 2)
CULTURE: NO GROWTH
Culture: NO GROWTH
Culture: NO GROWTH
Culture: NO GROWTH

## 2015-09-28 DIAGNOSIS — Z23 Encounter for immunization: Secondary | ICD-10-CM | POA: Diagnosis not present

## 2015-10-22 DIAGNOSIS — G894 Chronic pain syndrome: Secondary | ICD-10-CM | POA: Diagnosis not present

## 2015-10-22 DIAGNOSIS — M150 Primary generalized (osteo)arthritis: Secondary | ICD-10-CM | POA: Diagnosis not present

## 2015-10-22 DIAGNOSIS — I10 Essential (primary) hypertension: Secondary | ICD-10-CM | POA: Diagnosis not present

## 2015-10-22 DIAGNOSIS — R05 Cough: Secondary | ICD-10-CM | POA: Diagnosis not present

## 2015-10-22 DIAGNOSIS — M6281 Muscle weakness (generalized): Secondary | ICD-10-CM | POA: Diagnosis not present

## 2015-10-26 DIAGNOSIS — M6281 Muscle weakness (generalized): Secondary | ICD-10-CM | POA: Diagnosis not present

## 2015-10-26 DIAGNOSIS — I10 Essential (primary) hypertension: Secondary | ICD-10-CM | POA: Diagnosis not present

## 2015-10-26 DIAGNOSIS — M25561 Pain in right knee: Secondary | ICD-10-CM | POA: Diagnosis not present

## 2015-10-26 DIAGNOSIS — M159 Polyosteoarthritis, unspecified: Secondary | ICD-10-CM | POA: Diagnosis not present

## 2015-10-26 DIAGNOSIS — H409 Unspecified glaucoma: Secondary | ICD-10-CM | POA: Diagnosis not present

## 2015-10-26 DIAGNOSIS — E785 Hyperlipidemia, unspecified: Secondary | ICD-10-CM | POA: Diagnosis not present

## 2015-10-26 DIAGNOSIS — F0390 Unspecified dementia without behavioral disturbance: Secondary | ICD-10-CM | POA: Diagnosis not present

## 2015-11-03 DIAGNOSIS — E785 Hyperlipidemia, unspecified: Secondary | ICD-10-CM | POA: Diagnosis not present

## 2015-11-03 DIAGNOSIS — F0390 Unspecified dementia without behavioral disturbance: Secondary | ICD-10-CM | POA: Diagnosis not present

## 2015-11-03 DIAGNOSIS — R05 Cough: Secondary | ICD-10-CM | POA: Diagnosis not present

## 2015-11-03 DIAGNOSIS — M25561 Pain in right knee: Secondary | ICD-10-CM | POA: Diagnosis not present

## 2015-11-03 DIAGNOSIS — I10 Essential (primary) hypertension: Secondary | ICD-10-CM | POA: Diagnosis not present

## 2015-11-03 DIAGNOSIS — M6281 Muscle weakness (generalized): Secondary | ICD-10-CM | POA: Diagnosis not present

## 2015-11-03 DIAGNOSIS — M159 Polyosteoarthritis, unspecified: Secondary | ICD-10-CM | POA: Diagnosis not present

## 2015-11-03 DIAGNOSIS — H409 Unspecified glaucoma: Secondary | ICD-10-CM | POA: Diagnosis not present

## 2015-11-05 DIAGNOSIS — E785 Hyperlipidemia, unspecified: Secondary | ICD-10-CM | POA: Diagnosis not present

## 2015-11-05 DIAGNOSIS — H409 Unspecified glaucoma: Secondary | ICD-10-CM | POA: Diagnosis not present

## 2015-11-05 DIAGNOSIS — F0390 Unspecified dementia without behavioral disturbance: Secondary | ICD-10-CM | POA: Diagnosis not present

## 2015-11-05 DIAGNOSIS — I10 Essential (primary) hypertension: Secondary | ICD-10-CM | POA: Diagnosis not present

## 2015-11-05 DIAGNOSIS — M159 Polyosteoarthritis, unspecified: Secondary | ICD-10-CM | POA: Diagnosis not present

## 2015-11-05 DIAGNOSIS — M25561 Pain in right knee: Secondary | ICD-10-CM | POA: Diagnosis not present

## 2015-11-05 DIAGNOSIS — M6281 Muscle weakness (generalized): Secondary | ICD-10-CM | POA: Diagnosis not present

## 2015-11-06 DIAGNOSIS — I739 Peripheral vascular disease, unspecified: Secondary | ICD-10-CM | POA: Diagnosis not present

## 2015-11-10 DIAGNOSIS — H409 Unspecified glaucoma: Secondary | ICD-10-CM | POA: Diagnosis not present

## 2015-11-10 DIAGNOSIS — F0390 Unspecified dementia without behavioral disturbance: Secondary | ICD-10-CM | POA: Diagnosis not present

## 2015-11-10 DIAGNOSIS — I10 Essential (primary) hypertension: Secondary | ICD-10-CM | POA: Diagnosis not present

## 2015-11-10 DIAGNOSIS — M6281 Muscle weakness (generalized): Secondary | ICD-10-CM | POA: Diagnosis not present

## 2015-11-10 DIAGNOSIS — M159 Polyosteoarthritis, unspecified: Secondary | ICD-10-CM | POA: Diagnosis not present

## 2015-11-10 DIAGNOSIS — E785 Hyperlipidemia, unspecified: Secondary | ICD-10-CM | POA: Diagnosis not present

## 2015-11-10 DIAGNOSIS — M25561 Pain in right knee: Secondary | ICD-10-CM | POA: Diagnosis not present

## 2015-11-12 DIAGNOSIS — F0390 Unspecified dementia without behavioral disturbance: Secondary | ICD-10-CM | POA: Diagnosis not present

## 2015-11-12 DIAGNOSIS — M159 Polyosteoarthritis, unspecified: Secondary | ICD-10-CM | POA: Diagnosis not present

## 2015-11-12 DIAGNOSIS — E785 Hyperlipidemia, unspecified: Secondary | ICD-10-CM | POA: Diagnosis not present

## 2015-11-12 DIAGNOSIS — I10 Essential (primary) hypertension: Secondary | ICD-10-CM | POA: Diagnosis not present

## 2015-11-12 DIAGNOSIS — H409 Unspecified glaucoma: Secondary | ICD-10-CM | POA: Diagnosis not present

## 2015-11-12 DIAGNOSIS — M6281 Muscle weakness (generalized): Secondary | ICD-10-CM | POA: Diagnosis not present

## 2015-11-12 DIAGNOSIS — M25561 Pain in right knee: Secondary | ICD-10-CM | POA: Diagnosis not present

## 2015-11-17 DIAGNOSIS — F0390 Unspecified dementia without behavioral disturbance: Secondary | ICD-10-CM | POA: Diagnosis not present

## 2015-11-17 DIAGNOSIS — M159 Polyosteoarthritis, unspecified: Secondary | ICD-10-CM | POA: Diagnosis not present

## 2015-11-17 DIAGNOSIS — E785 Hyperlipidemia, unspecified: Secondary | ICD-10-CM | POA: Diagnosis not present

## 2015-11-17 DIAGNOSIS — H409 Unspecified glaucoma: Secondary | ICD-10-CM | POA: Diagnosis not present

## 2015-11-17 DIAGNOSIS — M6281 Muscle weakness (generalized): Secondary | ICD-10-CM | POA: Diagnosis not present

## 2015-11-17 DIAGNOSIS — I10 Essential (primary) hypertension: Secondary | ICD-10-CM | POA: Diagnosis not present

## 2015-11-17 DIAGNOSIS — M25561 Pain in right knee: Secondary | ICD-10-CM | POA: Diagnosis not present

## 2015-11-19 DIAGNOSIS — M25561 Pain in right knee: Secondary | ICD-10-CM | POA: Diagnosis not present

## 2015-11-19 DIAGNOSIS — M6281 Muscle weakness (generalized): Secondary | ICD-10-CM | POA: Diagnosis not present

## 2015-11-19 DIAGNOSIS — I10 Essential (primary) hypertension: Secondary | ICD-10-CM | POA: Diagnosis not present

## 2015-11-19 DIAGNOSIS — M159 Polyosteoarthritis, unspecified: Secondary | ICD-10-CM | POA: Diagnosis not present

## 2015-11-19 DIAGNOSIS — E785 Hyperlipidemia, unspecified: Secondary | ICD-10-CM | POA: Diagnosis not present

## 2015-11-19 DIAGNOSIS — H409 Unspecified glaucoma: Secondary | ICD-10-CM | POA: Diagnosis not present

## 2015-11-19 DIAGNOSIS — F0390 Unspecified dementia without behavioral disturbance: Secondary | ICD-10-CM | POA: Diagnosis not present

## 2015-11-24 DIAGNOSIS — I10 Essential (primary) hypertension: Secondary | ICD-10-CM | POA: Diagnosis not present

## 2015-11-24 DIAGNOSIS — F0390 Unspecified dementia without behavioral disturbance: Secondary | ICD-10-CM | POA: Diagnosis not present

## 2015-11-24 DIAGNOSIS — M6281 Muscle weakness (generalized): Secondary | ICD-10-CM | POA: Diagnosis not present

## 2015-11-24 DIAGNOSIS — H409 Unspecified glaucoma: Secondary | ICD-10-CM | POA: Diagnosis not present

## 2015-11-24 DIAGNOSIS — M159 Polyosteoarthritis, unspecified: Secondary | ICD-10-CM | POA: Diagnosis not present

## 2015-11-24 DIAGNOSIS — M25561 Pain in right knee: Secondary | ICD-10-CM | POA: Diagnosis not present

## 2015-11-24 DIAGNOSIS — E785 Hyperlipidemia, unspecified: Secondary | ICD-10-CM | POA: Diagnosis not present

## 2015-11-26 DIAGNOSIS — M159 Polyosteoarthritis, unspecified: Secondary | ICD-10-CM | POA: Diagnosis not present

## 2015-11-26 DIAGNOSIS — E785 Hyperlipidemia, unspecified: Secondary | ICD-10-CM | POA: Diagnosis not present

## 2015-11-26 DIAGNOSIS — I10 Essential (primary) hypertension: Secondary | ICD-10-CM | POA: Diagnosis not present

## 2015-11-26 DIAGNOSIS — M25561 Pain in right knee: Secondary | ICD-10-CM | POA: Diagnosis not present

## 2015-11-26 DIAGNOSIS — H409 Unspecified glaucoma: Secondary | ICD-10-CM | POA: Diagnosis not present

## 2015-11-26 DIAGNOSIS — M6281 Muscle weakness (generalized): Secondary | ICD-10-CM | POA: Diagnosis not present

## 2015-11-26 DIAGNOSIS — F0390 Unspecified dementia without behavioral disturbance: Secondary | ICD-10-CM | POA: Diagnosis not present

## 2015-12-01 DIAGNOSIS — G894 Chronic pain syndrome: Secondary | ICD-10-CM | POA: Diagnosis not present

## 2015-12-01 DIAGNOSIS — H409 Unspecified glaucoma: Secondary | ICD-10-CM | POA: Diagnosis not present

## 2015-12-01 DIAGNOSIS — E782 Mixed hyperlipidemia: Secondary | ICD-10-CM | POA: Diagnosis not present

## 2015-12-01 DIAGNOSIS — M159 Polyosteoarthritis, unspecified: Secondary | ICD-10-CM | POA: Diagnosis not present

## 2015-12-01 DIAGNOSIS — M150 Primary generalized (osteo)arthritis: Secondary | ICD-10-CM | POA: Diagnosis not present

## 2015-12-01 DIAGNOSIS — F0390 Unspecified dementia without behavioral disturbance: Secondary | ICD-10-CM | POA: Diagnosis not present

## 2015-12-01 DIAGNOSIS — M6281 Muscle weakness (generalized): Secondary | ICD-10-CM | POA: Diagnosis not present

## 2015-12-01 DIAGNOSIS — I10 Essential (primary) hypertension: Secondary | ICD-10-CM | POA: Diagnosis not present

## 2015-12-01 DIAGNOSIS — E785 Hyperlipidemia, unspecified: Secondary | ICD-10-CM | POA: Diagnosis not present

## 2015-12-01 DIAGNOSIS — M25561 Pain in right knee: Secondary | ICD-10-CM | POA: Diagnosis not present

## 2015-12-03 DIAGNOSIS — M6281 Muscle weakness (generalized): Secondary | ICD-10-CM | POA: Diagnosis not present

## 2015-12-03 DIAGNOSIS — F0390 Unspecified dementia without behavioral disturbance: Secondary | ICD-10-CM | POA: Diagnosis not present

## 2015-12-03 DIAGNOSIS — M25561 Pain in right knee: Secondary | ICD-10-CM | POA: Diagnosis not present

## 2015-12-03 DIAGNOSIS — E785 Hyperlipidemia, unspecified: Secondary | ICD-10-CM | POA: Diagnosis not present

## 2015-12-03 DIAGNOSIS — M159 Polyosteoarthritis, unspecified: Secondary | ICD-10-CM | POA: Diagnosis not present

## 2015-12-03 DIAGNOSIS — I10 Essential (primary) hypertension: Secondary | ICD-10-CM | POA: Diagnosis not present

## 2015-12-03 DIAGNOSIS — H409 Unspecified glaucoma: Secondary | ICD-10-CM | POA: Diagnosis not present

## 2015-12-04 DIAGNOSIS — H409 Unspecified glaucoma: Secondary | ICD-10-CM | POA: Diagnosis not present

## 2015-12-04 DIAGNOSIS — E785 Hyperlipidemia, unspecified: Secondary | ICD-10-CM | POA: Diagnosis not present

## 2015-12-04 DIAGNOSIS — F0390 Unspecified dementia without behavioral disturbance: Secondary | ICD-10-CM | POA: Diagnosis not present

## 2015-12-04 DIAGNOSIS — M6281 Muscle weakness (generalized): Secondary | ICD-10-CM | POA: Diagnosis not present

## 2015-12-04 DIAGNOSIS — M159 Polyosteoarthritis, unspecified: Secondary | ICD-10-CM | POA: Diagnosis not present

## 2015-12-04 DIAGNOSIS — M25561 Pain in right knee: Secondary | ICD-10-CM | POA: Diagnosis not present

## 2015-12-04 DIAGNOSIS — I10 Essential (primary) hypertension: Secondary | ICD-10-CM | POA: Diagnosis not present

## 2015-12-08 DIAGNOSIS — M159 Polyosteoarthritis, unspecified: Secondary | ICD-10-CM | POA: Diagnosis not present

## 2015-12-08 DIAGNOSIS — M6281 Muscle weakness (generalized): Secondary | ICD-10-CM | POA: Diagnosis not present

## 2015-12-08 DIAGNOSIS — M25561 Pain in right knee: Secondary | ICD-10-CM | POA: Diagnosis not present

## 2015-12-08 DIAGNOSIS — E785 Hyperlipidemia, unspecified: Secondary | ICD-10-CM | POA: Diagnosis not present

## 2015-12-08 DIAGNOSIS — H409 Unspecified glaucoma: Secondary | ICD-10-CM | POA: Diagnosis not present

## 2015-12-08 DIAGNOSIS — I10 Essential (primary) hypertension: Secondary | ICD-10-CM | POA: Diagnosis not present

## 2015-12-09 DIAGNOSIS — M159 Polyosteoarthritis, unspecified: Secondary | ICD-10-CM | POA: Diagnosis not present

## 2015-12-09 DIAGNOSIS — E785 Hyperlipidemia, unspecified: Secondary | ICD-10-CM | POA: Diagnosis not present

## 2015-12-09 DIAGNOSIS — I10 Essential (primary) hypertension: Secondary | ICD-10-CM | POA: Diagnosis not present

## 2015-12-09 DIAGNOSIS — H409 Unspecified glaucoma: Secondary | ICD-10-CM | POA: Diagnosis not present

## 2015-12-09 DIAGNOSIS — M6281 Muscle weakness (generalized): Secondary | ICD-10-CM | POA: Diagnosis not present

## 2015-12-09 DIAGNOSIS — M25561 Pain in right knee: Secondary | ICD-10-CM | POA: Diagnosis not present

## 2015-12-10 DIAGNOSIS — M159 Polyosteoarthritis, unspecified: Secondary | ICD-10-CM | POA: Diagnosis not present

## 2015-12-10 DIAGNOSIS — H409 Unspecified glaucoma: Secondary | ICD-10-CM | POA: Diagnosis not present

## 2015-12-10 DIAGNOSIS — M25561 Pain in right knee: Secondary | ICD-10-CM | POA: Diagnosis not present

## 2015-12-10 DIAGNOSIS — M6281 Muscle weakness (generalized): Secondary | ICD-10-CM | POA: Diagnosis not present

## 2015-12-10 DIAGNOSIS — I10 Essential (primary) hypertension: Secondary | ICD-10-CM | POA: Diagnosis not present

## 2015-12-10 DIAGNOSIS — E785 Hyperlipidemia, unspecified: Secondary | ICD-10-CM | POA: Diagnosis not present

## 2015-12-11 DIAGNOSIS — M25561 Pain in right knee: Secondary | ICD-10-CM | POA: Diagnosis not present

## 2015-12-11 DIAGNOSIS — E785 Hyperlipidemia, unspecified: Secondary | ICD-10-CM | POA: Diagnosis not present

## 2015-12-11 DIAGNOSIS — M159 Polyosteoarthritis, unspecified: Secondary | ICD-10-CM | POA: Diagnosis not present

## 2015-12-11 DIAGNOSIS — H409 Unspecified glaucoma: Secondary | ICD-10-CM | POA: Diagnosis not present

## 2015-12-11 DIAGNOSIS — M6281 Muscle weakness (generalized): Secondary | ICD-10-CM | POA: Diagnosis not present

## 2015-12-11 DIAGNOSIS — I10 Essential (primary) hypertension: Secondary | ICD-10-CM | POA: Diagnosis not present

## 2015-12-15 DIAGNOSIS — M25561 Pain in right knee: Secondary | ICD-10-CM | POA: Diagnosis not present

## 2015-12-15 DIAGNOSIS — I10 Essential (primary) hypertension: Secondary | ICD-10-CM | POA: Diagnosis not present

## 2015-12-15 DIAGNOSIS — M159 Polyosteoarthritis, unspecified: Secondary | ICD-10-CM | POA: Diagnosis not present

## 2015-12-15 DIAGNOSIS — E785 Hyperlipidemia, unspecified: Secondary | ICD-10-CM | POA: Diagnosis not present

## 2015-12-15 DIAGNOSIS — H409 Unspecified glaucoma: Secondary | ICD-10-CM | POA: Diagnosis not present

## 2015-12-15 DIAGNOSIS — J069 Acute upper respiratory infection, unspecified: Secondary | ICD-10-CM | POA: Diagnosis not present

## 2015-12-15 DIAGNOSIS — M6281 Muscle weakness (generalized): Secondary | ICD-10-CM | POA: Diagnosis not present

## 2015-12-18 DIAGNOSIS — H409 Unspecified glaucoma: Secondary | ICD-10-CM | POA: Diagnosis not present

## 2015-12-18 DIAGNOSIS — E785 Hyperlipidemia, unspecified: Secondary | ICD-10-CM | POA: Diagnosis not present

## 2015-12-18 DIAGNOSIS — M6281 Muscle weakness (generalized): Secondary | ICD-10-CM | POA: Diagnosis not present

## 2015-12-18 DIAGNOSIS — M159 Polyosteoarthritis, unspecified: Secondary | ICD-10-CM | POA: Diagnosis not present

## 2015-12-18 DIAGNOSIS — M25561 Pain in right knee: Secondary | ICD-10-CM | POA: Diagnosis not present

## 2015-12-18 DIAGNOSIS — I10 Essential (primary) hypertension: Secondary | ICD-10-CM | POA: Diagnosis not present

## 2015-12-22 DIAGNOSIS — M170 Bilateral primary osteoarthritis of knee: Secondary | ICD-10-CM | POA: Diagnosis not present

## 2015-12-22 DIAGNOSIS — M6281 Muscle weakness (generalized): Secondary | ICD-10-CM | POA: Diagnosis not present

## 2015-12-22 DIAGNOSIS — M159 Polyosteoarthritis, unspecified: Secondary | ICD-10-CM | POA: Diagnosis not present

## 2015-12-22 DIAGNOSIS — G894 Chronic pain syndrome: Secondary | ICD-10-CM | POA: Diagnosis not present

## 2015-12-22 DIAGNOSIS — M25561 Pain in right knee: Secondary | ICD-10-CM | POA: Diagnosis not present

## 2015-12-22 DIAGNOSIS — H6123 Impacted cerumen, bilateral: Secondary | ICD-10-CM | POA: Diagnosis not present

## 2015-12-22 DIAGNOSIS — I10 Essential (primary) hypertension: Secondary | ICD-10-CM | POA: Diagnosis not present

## 2015-12-22 DIAGNOSIS — E785 Hyperlipidemia, unspecified: Secondary | ICD-10-CM | POA: Diagnosis not present

## 2015-12-22 DIAGNOSIS — H409 Unspecified glaucoma: Secondary | ICD-10-CM | POA: Diagnosis not present

## 2015-12-24 DIAGNOSIS — I10 Essential (primary) hypertension: Secondary | ICD-10-CM | POA: Diagnosis not present

## 2015-12-24 DIAGNOSIS — H409 Unspecified glaucoma: Secondary | ICD-10-CM | POA: Diagnosis not present

## 2015-12-24 DIAGNOSIS — M159 Polyosteoarthritis, unspecified: Secondary | ICD-10-CM | POA: Diagnosis not present

## 2015-12-24 DIAGNOSIS — E785 Hyperlipidemia, unspecified: Secondary | ICD-10-CM | POA: Diagnosis not present

## 2015-12-24 DIAGNOSIS — M25561 Pain in right knee: Secondary | ICD-10-CM | POA: Diagnosis not present

## 2015-12-24 DIAGNOSIS — M6281 Muscle weakness (generalized): Secondary | ICD-10-CM | POA: Diagnosis not present

## 2015-12-29 DIAGNOSIS — R062 Wheezing: Secondary | ICD-10-CM | POA: Diagnosis not present

## 2015-12-29 DIAGNOSIS — E785 Hyperlipidemia, unspecified: Secondary | ICD-10-CM | POA: Diagnosis not present

## 2015-12-29 DIAGNOSIS — M6281 Muscle weakness (generalized): Secondary | ICD-10-CM | POA: Diagnosis not present

## 2015-12-29 DIAGNOSIS — I10 Essential (primary) hypertension: Secondary | ICD-10-CM | POA: Diagnosis not present

## 2015-12-29 DIAGNOSIS — R0602 Shortness of breath: Secondary | ICD-10-CM | POA: Diagnosis not present

## 2015-12-29 DIAGNOSIS — E782 Mixed hyperlipidemia: Secondary | ICD-10-CM | POA: Diagnosis not present

## 2015-12-29 DIAGNOSIS — M159 Polyosteoarthritis, unspecified: Secondary | ICD-10-CM | POA: Diagnosis not present

## 2015-12-29 DIAGNOSIS — R05 Cough: Secondary | ICD-10-CM | POA: Diagnosis not present

## 2015-12-29 DIAGNOSIS — H409 Unspecified glaucoma: Secondary | ICD-10-CM | POA: Diagnosis not present

## 2015-12-29 DIAGNOSIS — M25561 Pain in right knee: Secondary | ICD-10-CM | POA: Diagnosis not present

## 2015-12-31 DIAGNOSIS — E785 Hyperlipidemia, unspecified: Secondary | ICD-10-CM | POA: Diagnosis not present

## 2015-12-31 DIAGNOSIS — M159 Polyosteoarthritis, unspecified: Secondary | ICD-10-CM | POA: Diagnosis not present

## 2015-12-31 DIAGNOSIS — H409 Unspecified glaucoma: Secondary | ICD-10-CM | POA: Diagnosis not present

## 2015-12-31 DIAGNOSIS — M25561 Pain in right knee: Secondary | ICD-10-CM | POA: Diagnosis not present

## 2015-12-31 DIAGNOSIS — I10 Essential (primary) hypertension: Secondary | ICD-10-CM | POA: Diagnosis not present

## 2015-12-31 DIAGNOSIS — M6281 Muscle weakness (generalized): Secondary | ICD-10-CM | POA: Diagnosis not present

## 2016-01-05 DIAGNOSIS — H409 Unspecified glaucoma: Secondary | ICD-10-CM | POA: Diagnosis not present

## 2016-01-05 DIAGNOSIS — I10 Essential (primary) hypertension: Secondary | ICD-10-CM | POA: Diagnosis not present

## 2016-01-05 DIAGNOSIS — E785 Hyperlipidemia, unspecified: Secondary | ICD-10-CM | POA: Diagnosis not present

## 2016-01-05 DIAGNOSIS — M159 Polyosteoarthritis, unspecified: Secondary | ICD-10-CM | POA: Diagnosis not present

## 2016-01-05 DIAGNOSIS — M25561 Pain in right knee: Secondary | ICD-10-CM | POA: Diagnosis not present

## 2016-01-05 DIAGNOSIS — M6281 Muscle weakness (generalized): Secondary | ICD-10-CM | POA: Diagnosis not present

## 2016-01-07 DIAGNOSIS — M159 Polyosteoarthritis, unspecified: Secondary | ICD-10-CM | POA: Diagnosis not present

## 2016-01-07 DIAGNOSIS — H409 Unspecified glaucoma: Secondary | ICD-10-CM | POA: Diagnosis not present

## 2016-01-07 DIAGNOSIS — M6281 Muscle weakness (generalized): Secondary | ICD-10-CM | POA: Diagnosis not present

## 2016-01-07 DIAGNOSIS — I10 Essential (primary) hypertension: Secondary | ICD-10-CM | POA: Diagnosis not present

## 2016-01-07 DIAGNOSIS — E785 Hyperlipidemia, unspecified: Secondary | ICD-10-CM | POA: Diagnosis not present

## 2016-01-07 DIAGNOSIS — M25561 Pain in right knee: Secondary | ICD-10-CM | POA: Diagnosis not present

## 2016-01-08 DIAGNOSIS — I739 Peripheral vascular disease, unspecified: Secondary | ICD-10-CM | POA: Diagnosis not present

## 2016-01-12 DIAGNOSIS — M25561 Pain in right knee: Secondary | ICD-10-CM | POA: Diagnosis not present

## 2016-01-12 DIAGNOSIS — E785 Hyperlipidemia, unspecified: Secondary | ICD-10-CM | POA: Diagnosis not present

## 2016-01-12 DIAGNOSIS — I10 Essential (primary) hypertension: Secondary | ICD-10-CM | POA: Diagnosis not present

## 2016-01-12 DIAGNOSIS — H409 Unspecified glaucoma: Secondary | ICD-10-CM | POA: Diagnosis not present

## 2016-01-12 DIAGNOSIS — M159 Polyosteoarthritis, unspecified: Secondary | ICD-10-CM | POA: Diagnosis not present

## 2016-01-12 DIAGNOSIS — M6281 Muscle weakness (generalized): Secondary | ICD-10-CM | POA: Diagnosis not present

## 2016-01-14 DIAGNOSIS — M25561 Pain in right knee: Secondary | ICD-10-CM | POA: Diagnosis not present

## 2016-01-14 DIAGNOSIS — H409 Unspecified glaucoma: Secondary | ICD-10-CM | POA: Diagnosis not present

## 2016-01-14 DIAGNOSIS — M6281 Muscle weakness (generalized): Secondary | ICD-10-CM | POA: Diagnosis not present

## 2016-01-14 DIAGNOSIS — M159 Polyosteoarthritis, unspecified: Secondary | ICD-10-CM | POA: Diagnosis not present

## 2016-01-14 DIAGNOSIS — I10 Essential (primary) hypertension: Secondary | ICD-10-CM | POA: Diagnosis not present

## 2016-01-14 DIAGNOSIS — E785 Hyperlipidemia, unspecified: Secondary | ICD-10-CM | POA: Diagnosis not present

## 2016-01-19 DIAGNOSIS — G894 Chronic pain syndrome: Secondary | ICD-10-CM | POA: Diagnosis not present

## 2016-01-19 DIAGNOSIS — I10 Essential (primary) hypertension: Secondary | ICD-10-CM | POA: Diagnosis not present

## 2016-01-19 DIAGNOSIS — M6281 Muscle weakness (generalized): Secondary | ICD-10-CM | POA: Diagnosis not present

## 2016-01-19 DIAGNOSIS — M170 Bilateral primary osteoarthritis of knee: Secondary | ICD-10-CM | POA: Diagnosis not present

## 2016-01-19 DIAGNOSIS — M25561 Pain in right knee: Secondary | ICD-10-CM | POA: Diagnosis not present

## 2016-01-19 DIAGNOSIS — H409 Unspecified glaucoma: Secondary | ICD-10-CM | POA: Diagnosis not present

## 2016-01-19 DIAGNOSIS — E785 Hyperlipidemia, unspecified: Secondary | ICD-10-CM | POA: Diagnosis not present

## 2016-01-19 DIAGNOSIS — M159 Polyosteoarthritis, unspecified: Secondary | ICD-10-CM | POA: Diagnosis not present

## 2016-01-21 DIAGNOSIS — H409 Unspecified glaucoma: Secondary | ICD-10-CM | POA: Diagnosis not present

## 2016-01-21 DIAGNOSIS — E785 Hyperlipidemia, unspecified: Secondary | ICD-10-CM | POA: Diagnosis not present

## 2016-01-21 DIAGNOSIS — I10 Essential (primary) hypertension: Secondary | ICD-10-CM | POA: Diagnosis not present

## 2016-01-21 DIAGNOSIS — M159 Polyosteoarthritis, unspecified: Secondary | ICD-10-CM | POA: Diagnosis not present

## 2016-01-21 DIAGNOSIS — M6281 Muscle weakness (generalized): Secondary | ICD-10-CM | POA: Diagnosis not present

## 2016-01-21 DIAGNOSIS — M25561 Pain in right knee: Secondary | ICD-10-CM | POA: Diagnosis not present

## 2016-01-22 DIAGNOSIS — E782 Mixed hyperlipidemia: Secondary | ICD-10-CM | POA: Diagnosis not present

## 2016-01-22 DIAGNOSIS — I10 Essential (primary) hypertension: Secondary | ICD-10-CM | POA: Diagnosis not present

## 2016-01-22 DIAGNOSIS — E039 Hypothyroidism, unspecified: Secondary | ICD-10-CM | POA: Diagnosis not present

## 2016-01-26 DIAGNOSIS — I10 Essential (primary) hypertension: Secondary | ICD-10-CM | POA: Diagnosis not present

## 2016-01-26 DIAGNOSIS — M6281 Muscle weakness (generalized): Secondary | ICD-10-CM | POA: Diagnosis not present

## 2016-01-26 DIAGNOSIS — M159 Polyosteoarthritis, unspecified: Secondary | ICD-10-CM | POA: Diagnosis not present

## 2016-01-26 DIAGNOSIS — M25561 Pain in right knee: Secondary | ICD-10-CM | POA: Diagnosis not present

## 2016-01-26 DIAGNOSIS — E785 Hyperlipidemia, unspecified: Secondary | ICD-10-CM | POA: Diagnosis not present

## 2016-01-26 DIAGNOSIS — H409 Unspecified glaucoma: Secondary | ICD-10-CM | POA: Diagnosis not present

## 2016-01-28 DIAGNOSIS — I10 Essential (primary) hypertension: Secondary | ICD-10-CM | POA: Diagnosis not present

## 2016-01-28 DIAGNOSIS — M25561 Pain in right knee: Secondary | ICD-10-CM | POA: Diagnosis not present

## 2016-01-28 DIAGNOSIS — E785 Hyperlipidemia, unspecified: Secondary | ICD-10-CM | POA: Diagnosis not present

## 2016-01-28 DIAGNOSIS — H409 Unspecified glaucoma: Secondary | ICD-10-CM | POA: Diagnosis not present

## 2016-01-28 DIAGNOSIS — M159 Polyosteoarthritis, unspecified: Secondary | ICD-10-CM | POA: Diagnosis not present

## 2016-01-28 DIAGNOSIS — M6281 Muscle weakness (generalized): Secondary | ICD-10-CM | POA: Diagnosis not present

## 2016-01-28 DIAGNOSIS — J309 Allergic rhinitis, unspecified: Secondary | ICD-10-CM | POA: Diagnosis not present

## 2016-01-28 DIAGNOSIS — R05 Cough: Secondary | ICD-10-CM | POA: Diagnosis not present

## 2016-02-02 DIAGNOSIS — E785 Hyperlipidemia, unspecified: Secondary | ICD-10-CM | POA: Diagnosis not present

## 2016-02-02 DIAGNOSIS — I10 Essential (primary) hypertension: Secondary | ICD-10-CM | POA: Diagnosis not present

## 2016-02-02 DIAGNOSIS — M159 Polyosteoarthritis, unspecified: Secondary | ICD-10-CM | POA: Diagnosis not present

## 2016-02-02 DIAGNOSIS — M6281 Muscle weakness (generalized): Secondary | ICD-10-CM | POA: Diagnosis not present

## 2016-02-02 DIAGNOSIS — M25561 Pain in right knee: Secondary | ICD-10-CM | POA: Diagnosis not present

## 2016-02-02 DIAGNOSIS — H409 Unspecified glaucoma: Secondary | ICD-10-CM | POA: Diagnosis not present

## 2016-02-04 DIAGNOSIS — M159 Polyosteoarthritis, unspecified: Secondary | ICD-10-CM | POA: Diagnosis not present

## 2016-02-04 DIAGNOSIS — H409 Unspecified glaucoma: Secondary | ICD-10-CM | POA: Diagnosis not present

## 2016-02-04 DIAGNOSIS — M25561 Pain in right knee: Secondary | ICD-10-CM | POA: Diagnosis not present

## 2016-02-04 DIAGNOSIS — I10 Essential (primary) hypertension: Secondary | ICD-10-CM | POA: Diagnosis not present

## 2016-02-04 DIAGNOSIS — E785 Hyperlipidemia, unspecified: Secondary | ICD-10-CM | POA: Diagnosis not present

## 2016-02-04 DIAGNOSIS — M6281 Muscle weakness (generalized): Secondary | ICD-10-CM | POA: Diagnosis not present

## 2016-02-09 DIAGNOSIS — H409 Unspecified glaucoma: Secondary | ICD-10-CM | POA: Diagnosis not present

## 2016-02-09 DIAGNOSIS — R0602 Shortness of breath: Secondary | ICD-10-CM | POA: Diagnosis not present

## 2016-02-09 DIAGNOSIS — E782 Mixed hyperlipidemia: Secondary | ICD-10-CM | POA: Diagnosis not present

## 2016-02-09 DIAGNOSIS — F0150 Vascular dementia without behavioral disturbance: Secondary | ICD-10-CM | POA: Diagnosis not present

## 2016-02-09 DIAGNOSIS — M25561 Pain in right knee: Secondary | ICD-10-CM | POA: Diagnosis not present

## 2016-02-09 DIAGNOSIS — R05 Cough: Secondary | ICD-10-CM | POA: Diagnosis not present

## 2016-02-09 DIAGNOSIS — M6281 Muscle weakness (generalized): Secondary | ICD-10-CM | POA: Diagnosis not present

## 2016-02-09 DIAGNOSIS — R062 Wheezing: Secondary | ICD-10-CM | POA: Diagnosis not present

## 2016-02-09 DIAGNOSIS — E785 Hyperlipidemia, unspecified: Secondary | ICD-10-CM | POA: Diagnosis not present

## 2016-02-09 DIAGNOSIS — I10 Essential (primary) hypertension: Secondary | ICD-10-CM | POA: Diagnosis not present

## 2016-02-09 DIAGNOSIS — M159 Polyosteoarthritis, unspecified: Secondary | ICD-10-CM | POA: Diagnosis not present

## 2016-02-11 DIAGNOSIS — H409 Unspecified glaucoma: Secondary | ICD-10-CM | POA: Diagnosis not present

## 2016-02-11 DIAGNOSIS — M6281 Muscle weakness (generalized): Secondary | ICD-10-CM | POA: Diagnosis not present

## 2016-02-11 DIAGNOSIS — E785 Hyperlipidemia, unspecified: Secondary | ICD-10-CM | POA: Diagnosis not present

## 2016-02-11 DIAGNOSIS — I10 Essential (primary) hypertension: Secondary | ICD-10-CM | POA: Diagnosis not present

## 2016-02-11 DIAGNOSIS — R05 Cough: Secondary | ICD-10-CM | POA: Diagnosis not present

## 2016-02-11 DIAGNOSIS — M25561 Pain in right knee: Secondary | ICD-10-CM | POA: Diagnosis not present

## 2016-02-11 DIAGNOSIS — M159 Polyosteoarthritis, unspecified: Secondary | ICD-10-CM | POA: Diagnosis not present

## 2016-02-16 DIAGNOSIS — M159 Polyosteoarthritis, unspecified: Secondary | ICD-10-CM | POA: Diagnosis not present

## 2016-02-16 DIAGNOSIS — E785 Hyperlipidemia, unspecified: Secondary | ICD-10-CM | POA: Diagnosis not present

## 2016-02-16 DIAGNOSIS — G894 Chronic pain syndrome: Secondary | ICD-10-CM | POA: Diagnosis not present

## 2016-02-16 DIAGNOSIS — M6281 Muscle weakness (generalized): Secondary | ICD-10-CM | POA: Diagnosis not present

## 2016-02-16 DIAGNOSIS — H409 Unspecified glaucoma: Secondary | ICD-10-CM | POA: Diagnosis not present

## 2016-02-16 DIAGNOSIS — M25561 Pain in right knee: Secondary | ICD-10-CM | POA: Diagnosis not present

## 2016-02-16 DIAGNOSIS — I10 Essential (primary) hypertension: Secondary | ICD-10-CM | POA: Diagnosis not present

## 2016-02-16 DIAGNOSIS — M170 Bilateral primary osteoarthritis of knee: Secondary | ICD-10-CM | POA: Diagnosis not present

## 2016-02-18 DIAGNOSIS — M25561 Pain in right knee: Secondary | ICD-10-CM | POA: Diagnosis not present

## 2016-02-18 DIAGNOSIS — I10 Essential (primary) hypertension: Secondary | ICD-10-CM | POA: Diagnosis not present

## 2016-02-18 DIAGNOSIS — H409 Unspecified glaucoma: Secondary | ICD-10-CM | POA: Diagnosis not present

## 2016-02-18 DIAGNOSIS — M159 Polyosteoarthritis, unspecified: Secondary | ICD-10-CM | POA: Diagnosis not present

## 2016-02-18 DIAGNOSIS — M6281 Muscle weakness (generalized): Secondary | ICD-10-CM | POA: Diagnosis not present

## 2016-02-18 DIAGNOSIS — E785 Hyperlipidemia, unspecified: Secondary | ICD-10-CM | POA: Diagnosis not present

## 2016-03-08 DIAGNOSIS — R062 Wheezing: Secondary | ICD-10-CM | POA: Diagnosis not present

## 2016-03-08 DIAGNOSIS — E782 Mixed hyperlipidemia: Secondary | ICD-10-CM | POA: Diagnosis not present

## 2016-03-08 DIAGNOSIS — R0602 Shortness of breath: Secondary | ICD-10-CM | POA: Diagnosis not present

## 2016-03-08 DIAGNOSIS — I10 Essential (primary) hypertension: Secondary | ICD-10-CM | POA: Diagnosis not present

## 2016-03-08 DIAGNOSIS — R05 Cough: Secondary | ICD-10-CM | POA: Diagnosis not present

## 2016-03-11 DIAGNOSIS — M2010 Hallux valgus (acquired), unspecified foot: Secondary | ICD-10-CM | POA: Diagnosis not present

## 2016-03-11 DIAGNOSIS — M170 Bilateral primary osteoarthritis of knee: Secondary | ICD-10-CM | POA: Diagnosis not present

## 2016-03-11 DIAGNOSIS — I10 Essential (primary) hypertension: Secondary | ICD-10-CM | POA: Diagnosis not present

## 2016-03-15 DIAGNOSIS — M170 Bilateral primary osteoarthritis of knee: Secondary | ICD-10-CM | POA: Diagnosis not present

## 2016-03-15 DIAGNOSIS — G894 Chronic pain syndrome: Secondary | ICD-10-CM | POA: Diagnosis not present

## 2016-03-29 DIAGNOSIS — R05 Cough: Secondary | ICD-10-CM | POA: Diagnosis not present

## 2016-04-05 DIAGNOSIS — R05 Cough: Secondary | ICD-10-CM | POA: Diagnosis not present

## 2016-04-05 DIAGNOSIS — R062 Wheezing: Secondary | ICD-10-CM | POA: Diagnosis not present

## 2016-04-05 DIAGNOSIS — I10 Essential (primary) hypertension: Secondary | ICD-10-CM | POA: Diagnosis not present

## 2016-04-05 DIAGNOSIS — R0602 Shortness of breath: Secondary | ICD-10-CM | POA: Diagnosis not present

## 2016-04-05 DIAGNOSIS — R197 Diarrhea, unspecified: Secondary | ICD-10-CM | POA: Diagnosis not present

## 2016-04-12 DIAGNOSIS — G894 Chronic pain syndrome: Secondary | ICD-10-CM | POA: Diagnosis not present

## 2016-04-12 DIAGNOSIS — M170 Bilateral primary osteoarthritis of knee: Secondary | ICD-10-CM | POA: Diagnosis not present

## 2016-04-18 DIAGNOSIS — F0150 Vascular dementia without behavioral disturbance: Secondary | ICD-10-CM | POA: Diagnosis not present

## 2016-05-03 DIAGNOSIS — R197 Diarrhea, unspecified: Secondary | ICD-10-CM | POA: Diagnosis not present

## 2016-05-03 DIAGNOSIS — R05 Cough: Secondary | ICD-10-CM | POA: Diagnosis not present

## 2016-05-03 DIAGNOSIS — R0602 Shortness of breath: Secondary | ICD-10-CM | POA: Diagnosis not present

## 2016-05-03 DIAGNOSIS — E782 Mixed hyperlipidemia: Secondary | ICD-10-CM | POA: Diagnosis not present

## 2016-05-03 DIAGNOSIS — I10 Essential (primary) hypertension: Secondary | ICD-10-CM | POA: Diagnosis not present

## 2016-05-10 DIAGNOSIS — M170 Bilateral primary osteoarthritis of knee: Secondary | ICD-10-CM | POA: Diagnosis not present

## 2016-05-16 DIAGNOSIS — E782 Mixed hyperlipidemia: Secondary | ICD-10-CM | POA: Diagnosis not present

## 2016-05-16 DIAGNOSIS — R197 Diarrhea, unspecified: Secondary | ICD-10-CM | POA: Diagnosis not present

## 2016-05-16 DIAGNOSIS — R0602 Shortness of breath: Secondary | ICD-10-CM | POA: Diagnosis not present

## 2016-05-16 DIAGNOSIS — I10 Essential (primary) hypertension: Secondary | ICD-10-CM | POA: Diagnosis not present

## 2016-05-16 DIAGNOSIS — R05 Cough: Secondary | ICD-10-CM | POA: Diagnosis not present

## 2016-05-31 DIAGNOSIS — R05 Cough: Secondary | ICD-10-CM | POA: Diagnosis not present

## 2016-05-31 DIAGNOSIS — R197 Diarrhea, unspecified: Secondary | ICD-10-CM | POA: Diagnosis not present

## 2016-05-31 DIAGNOSIS — E782 Mixed hyperlipidemia: Secondary | ICD-10-CM | POA: Diagnosis not present

## 2016-05-31 DIAGNOSIS — R0602 Shortness of breath: Secondary | ICD-10-CM | POA: Diagnosis not present

## 2016-05-31 DIAGNOSIS — I10 Essential (primary) hypertension: Secondary | ICD-10-CM | POA: Diagnosis not present

## 2016-06-10 DIAGNOSIS — M2010 Hallux valgus (acquired), unspecified foot: Secondary | ICD-10-CM | POA: Diagnosis not present

## 2016-06-10 DIAGNOSIS — I10 Essential (primary) hypertension: Secondary | ICD-10-CM | POA: Diagnosis not present

## 2016-06-10 DIAGNOSIS — M170 Bilateral primary osteoarthritis of knee: Secondary | ICD-10-CM | POA: Diagnosis not present

## 2016-06-14 DIAGNOSIS — M170 Bilateral primary osteoarthritis of knee: Secondary | ICD-10-CM | POA: Diagnosis not present

## 2016-06-14 DIAGNOSIS — G894 Chronic pain syndrome: Secondary | ICD-10-CM | POA: Diagnosis not present

## 2016-06-28 DIAGNOSIS — J309 Allergic rhinitis, unspecified: Secondary | ICD-10-CM | POA: Diagnosis not present

## 2016-06-28 DIAGNOSIS — E559 Vitamin D deficiency, unspecified: Secondary | ICD-10-CM | POA: Diagnosis not present

## 2016-06-28 DIAGNOSIS — I10 Essential (primary) hypertension: Secondary | ICD-10-CM | POA: Diagnosis not present

## 2016-06-28 DIAGNOSIS — M150 Primary generalized (osteo)arthritis: Secondary | ICD-10-CM | POA: Diagnosis not present

## 2016-06-28 DIAGNOSIS — G894 Chronic pain syndrome: Secondary | ICD-10-CM | POA: Diagnosis not present

## 2016-06-29 DIAGNOSIS — G894 Chronic pain syndrome: Secondary | ICD-10-CM | POA: Diagnosis not present

## 2016-07-01 DIAGNOSIS — E782 Mixed hyperlipidemia: Secondary | ICD-10-CM | POA: Diagnosis not present

## 2016-07-01 DIAGNOSIS — I10 Essential (primary) hypertension: Secondary | ICD-10-CM | POA: Diagnosis not present

## 2016-07-01 DIAGNOSIS — E559 Vitamin D deficiency, unspecified: Secondary | ICD-10-CM | POA: Diagnosis not present

## 2016-07-01 DIAGNOSIS — R0602 Shortness of breath: Secondary | ICD-10-CM | POA: Diagnosis not present

## 2016-07-05 DIAGNOSIS — M170 Bilateral primary osteoarthritis of knee: Secondary | ICD-10-CM | POA: Diagnosis not present

## 2016-07-05 DIAGNOSIS — G894 Chronic pain syndrome: Secondary | ICD-10-CM | POA: Diagnosis not present

## 2016-07-12 DIAGNOSIS — J309 Allergic rhinitis, unspecified: Secondary | ICD-10-CM | POA: Diagnosis not present

## 2016-07-12 DIAGNOSIS — R05 Cough: Secondary | ICD-10-CM | POA: Diagnosis not present

## 2016-07-26 DIAGNOSIS — R062 Wheezing: Secondary | ICD-10-CM | POA: Diagnosis not present

## 2016-07-26 DIAGNOSIS — J309 Allergic rhinitis, unspecified: Secondary | ICD-10-CM | POA: Diagnosis not present

## 2016-07-26 DIAGNOSIS — M150 Primary generalized (osteo)arthritis: Secondary | ICD-10-CM | POA: Diagnosis not present

## 2016-07-26 DIAGNOSIS — G894 Chronic pain syndrome: Secondary | ICD-10-CM | POA: Diagnosis not present

## 2016-07-26 DIAGNOSIS — H402294 Chronic angle-closure glaucoma, unspecified eye, indeterminate stage: Secondary | ICD-10-CM | POA: Diagnosis not present

## 2016-08-02 DIAGNOSIS — M170 Bilateral primary osteoarthritis of knee: Secondary | ICD-10-CM | POA: Diagnosis not present

## 2016-08-02 DIAGNOSIS — R600 Localized edema: Secondary | ICD-10-CM | POA: Diagnosis not present

## 2016-08-02 DIAGNOSIS — G894 Chronic pain syndrome: Secondary | ICD-10-CM | POA: Diagnosis not present

## 2016-08-02 DIAGNOSIS — R0602 Shortness of breath: Secondary | ICD-10-CM | POA: Diagnosis not present

## 2016-08-02 DIAGNOSIS — R062 Wheezing: Secondary | ICD-10-CM | POA: Diagnosis not present

## 2016-08-03 DIAGNOSIS — F419 Anxiety disorder, unspecified: Secondary | ICD-10-CM | POA: Diagnosis not present

## 2016-08-03 DIAGNOSIS — F0150 Vascular dementia without behavioral disturbance: Secondary | ICD-10-CM | POA: Diagnosis not present

## 2016-08-16 DIAGNOSIS — R062 Wheezing: Secondary | ICD-10-CM | POA: Diagnosis not present

## 2016-08-16 DIAGNOSIS — I10 Essential (primary) hypertension: Secondary | ICD-10-CM | POA: Diagnosis not present

## 2016-08-16 DIAGNOSIS — R0602 Shortness of breath: Secondary | ICD-10-CM | POA: Diagnosis not present

## 2016-08-16 DIAGNOSIS — E559 Vitamin D deficiency, unspecified: Secondary | ICD-10-CM | POA: Diagnosis not present

## 2016-08-16 DIAGNOSIS — R600 Localized edema: Secondary | ICD-10-CM | POA: Diagnosis not present

## 2016-08-23 DIAGNOSIS — I10 Essential (primary) hypertension: Secondary | ICD-10-CM | POA: Diagnosis not present

## 2016-08-23 DIAGNOSIS — M170 Bilateral primary osteoarthritis of knee: Secondary | ICD-10-CM | POA: Diagnosis not present

## 2016-08-23 DIAGNOSIS — G894 Chronic pain syndrome: Secondary | ICD-10-CM | POA: Diagnosis not present

## 2016-08-30 DIAGNOSIS — Z79899 Other long term (current) drug therapy: Secondary | ICD-10-CM | POA: Diagnosis not present

## 2016-09-20 DIAGNOSIS — I10 Essential (primary) hypertension: Secondary | ICD-10-CM | POA: Diagnosis not present

## 2016-09-20 DIAGNOSIS — J309 Allergic rhinitis, unspecified: Secondary | ICD-10-CM | POA: Diagnosis not present

## 2016-09-20 DIAGNOSIS — R600 Localized edema: Secondary | ICD-10-CM | POA: Diagnosis not present

## 2016-09-20 DIAGNOSIS — M150 Primary generalized (osteo)arthritis: Secondary | ICD-10-CM | POA: Diagnosis not present

## 2016-09-20 DIAGNOSIS — E559 Vitamin D deficiency, unspecified: Secondary | ICD-10-CM | POA: Diagnosis not present

## 2016-09-27 DIAGNOSIS — R531 Weakness: Secondary | ICD-10-CM | POA: Diagnosis not present

## 2016-09-27 DIAGNOSIS — M6281 Muscle weakness (generalized): Secondary | ICD-10-CM | POA: Diagnosis not present

## 2016-09-27 DIAGNOSIS — G894 Chronic pain syndrome: Secondary | ICD-10-CM | POA: Diagnosis not present

## 2016-09-30 DIAGNOSIS — R531 Weakness: Secondary | ICD-10-CM | POA: Diagnosis not present

## 2016-09-30 DIAGNOSIS — R269 Unspecified abnormalities of gait and mobility: Secondary | ICD-10-CM | POA: Diagnosis not present

## 2016-09-30 DIAGNOSIS — M1990 Unspecified osteoarthritis, unspecified site: Secondary | ICD-10-CM | POA: Diagnosis not present

## 2016-09-30 DIAGNOSIS — Z9181 History of falling: Secondary | ICD-10-CM | POA: Diagnosis not present

## 2016-09-30 DIAGNOSIS — M519 Unspecified thoracic, thoracolumbar and lumbosacral intervertebral disc disorder: Secondary | ICD-10-CM | POA: Diagnosis not present

## 2016-10-04 DIAGNOSIS — R531 Weakness: Secondary | ICD-10-CM | POA: Diagnosis not present

## 2016-10-04 DIAGNOSIS — M1990 Unspecified osteoarthritis, unspecified site: Secondary | ICD-10-CM | POA: Diagnosis not present

## 2016-10-04 DIAGNOSIS — R269 Unspecified abnormalities of gait and mobility: Secondary | ICD-10-CM | POA: Diagnosis not present

## 2016-10-04 DIAGNOSIS — Z9181 History of falling: Secondary | ICD-10-CM | POA: Diagnosis not present

## 2016-10-04 DIAGNOSIS — M519 Unspecified thoracic, thoracolumbar and lumbosacral intervertebral disc disorder: Secondary | ICD-10-CM | POA: Diagnosis not present

## 2016-10-07 DIAGNOSIS — M519 Unspecified thoracic, thoracolumbar and lumbosacral intervertebral disc disorder: Secondary | ICD-10-CM | POA: Diagnosis not present

## 2016-10-07 DIAGNOSIS — R269 Unspecified abnormalities of gait and mobility: Secondary | ICD-10-CM | POA: Diagnosis not present

## 2016-10-07 DIAGNOSIS — Z9181 History of falling: Secondary | ICD-10-CM | POA: Diagnosis not present

## 2016-10-07 DIAGNOSIS — M1990 Unspecified osteoarthritis, unspecified site: Secondary | ICD-10-CM | POA: Diagnosis not present

## 2016-10-07 DIAGNOSIS — R531 Weakness: Secondary | ICD-10-CM | POA: Diagnosis not present

## 2016-10-18 DIAGNOSIS — M150 Primary generalized (osteo)arthritis: Secondary | ICD-10-CM | POA: Diagnosis not present

## 2016-10-18 DIAGNOSIS — R600 Localized edema: Secondary | ICD-10-CM | POA: Diagnosis not present

## 2016-10-18 DIAGNOSIS — R062 Wheezing: Secondary | ICD-10-CM | POA: Diagnosis not present

## 2016-10-18 DIAGNOSIS — J309 Allergic rhinitis, unspecified: Secondary | ICD-10-CM | POA: Diagnosis not present

## 2016-10-18 DIAGNOSIS — I10 Essential (primary) hypertension: Secondary | ICD-10-CM | POA: Diagnosis not present

## 2016-10-25 DIAGNOSIS — G894 Chronic pain syndrome: Secondary | ICD-10-CM | POA: Diagnosis not present

## 2016-10-25 DIAGNOSIS — M6281 Muscle weakness (generalized): Secondary | ICD-10-CM | POA: Diagnosis not present

## 2016-10-25 DIAGNOSIS — R531 Weakness: Secondary | ICD-10-CM | POA: Diagnosis not present

## 2016-10-26 DIAGNOSIS — N390 Urinary tract infection, site not specified: Secondary | ICD-10-CM | POA: Diagnosis not present

## 2016-10-28 DIAGNOSIS — I10 Essential (primary) hypertension: Secondary | ICD-10-CM | POA: Diagnosis not present

## 2016-10-28 DIAGNOSIS — E039 Hypothyroidism, unspecified: Secondary | ICD-10-CM | POA: Diagnosis not present

## 2016-10-28 DIAGNOSIS — Z0001 Encounter for general adult medical examination with abnormal findings: Secondary | ICD-10-CM | POA: Diagnosis not present

## 2016-11-01 DIAGNOSIS — H6121 Impacted cerumen, right ear: Secondary | ICD-10-CM | POA: Diagnosis not present

## 2016-11-01 DIAGNOSIS — H6122 Impacted cerumen, left ear: Secondary | ICD-10-CM | POA: Diagnosis not present

## 2016-11-09 DIAGNOSIS — Z79899 Other long term (current) drug therapy: Secondary | ICD-10-CM | POA: Diagnosis not present

## 2016-11-16 DIAGNOSIS — I10 Essential (primary) hypertension: Secondary | ICD-10-CM | POA: Diagnosis not present

## 2016-11-16 DIAGNOSIS — R600 Localized edema: Secondary | ICD-10-CM | POA: Diagnosis not present

## 2016-11-16 DIAGNOSIS — J309 Allergic rhinitis, unspecified: Secondary | ICD-10-CM | POA: Diagnosis not present

## 2016-11-16 DIAGNOSIS — M150 Primary generalized (osteo)arthritis: Secondary | ICD-10-CM | POA: Diagnosis not present

## 2016-11-16 DIAGNOSIS — R062 Wheezing: Secondary | ICD-10-CM | POA: Diagnosis not present

## 2016-11-22 DIAGNOSIS — R531 Weakness: Secondary | ICD-10-CM | POA: Diagnosis not present

## 2016-11-22 DIAGNOSIS — M6281 Muscle weakness (generalized): Secondary | ICD-10-CM | POA: Diagnosis not present

## 2016-11-22 DIAGNOSIS — G894 Chronic pain syndrome: Secondary | ICD-10-CM | POA: Diagnosis not present

## 2016-11-22 DIAGNOSIS — H6123 Impacted cerumen, bilateral: Secondary | ICD-10-CM | POA: Diagnosis not present

## 2016-11-23 DIAGNOSIS — N390 Urinary tract infection, site not specified: Secondary | ICD-10-CM | POA: Diagnosis not present

## 2016-11-29 DIAGNOSIS — I10 Essential (primary) hypertension: Secondary | ICD-10-CM | POA: Diagnosis not present

## 2016-11-29 DIAGNOSIS — R062 Wheezing: Secondary | ICD-10-CM | POA: Diagnosis not present

## 2016-11-29 DIAGNOSIS — J309 Allergic rhinitis, unspecified: Secondary | ICD-10-CM | POA: Diagnosis not present

## 2016-11-29 DIAGNOSIS — N390 Urinary tract infection, site not specified: Secondary | ICD-10-CM | POA: Diagnosis not present

## 2016-11-29 DIAGNOSIS — R0602 Shortness of breath: Secondary | ICD-10-CM | POA: Diagnosis not present

## 2016-12-20 DIAGNOSIS — Z20828 Contact with and (suspected) exposure to other viral communicable diseases: Secondary | ICD-10-CM | POA: Diagnosis not present

## 2016-12-20 DIAGNOSIS — G894 Chronic pain syndrome: Secondary | ICD-10-CM | POA: Diagnosis not present

## 2016-12-27 DIAGNOSIS — Z0000 Encounter for general adult medical examination without abnormal findings: Secondary | ICD-10-CM | POA: Diagnosis not present

## 2017-01-03 DIAGNOSIS — F5109 Other insomnia not due to a substance or known physiological condition: Secondary | ICD-10-CM | POA: Diagnosis not present

## 2017-01-17 DIAGNOSIS — G894 Chronic pain syndrome: Secondary | ICD-10-CM | POA: Diagnosis not present

## 2017-01-17 DIAGNOSIS — R062 Wheezing: Secondary | ICD-10-CM | POA: Diagnosis not present

## 2017-01-17 DIAGNOSIS — I10 Essential (primary) hypertension: Secondary | ICD-10-CM | POA: Diagnosis not present

## 2017-01-17 DIAGNOSIS — J309 Allergic rhinitis, unspecified: Secondary | ICD-10-CM | POA: Diagnosis not present

## 2017-01-17 DIAGNOSIS — R0602 Shortness of breath: Secondary | ICD-10-CM | POA: Diagnosis not present

## 2017-01-24 DIAGNOSIS — E876 Hypokalemia: Secondary | ICD-10-CM | POA: Diagnosis not present

## 2017-01-24 DIAGNOSIS — G894 Chronic pain syndrome: Secondary | ICD-10-CM | POA: Diagnosis not present

## 2017-01-24 DIAGNOSIS — J309 Allergic rhinitis, unspecified: Secondary | ICD-10-CM | POA: Diagnosis not present

## 2017-01-26 DIAGNOSIS — Z79899 Other long term (current) drug therapy: Secondary | ICD-10-CM | POA: Diagnosis not present

## 2017-01-27 DIAGNOSIS — J309 Allergic rhinitis, unspecified: Secondary | ICD-10-CM | POA: Diagnosis not present

## 2017-01-27 DIAGNOSIS — E785 Hyperlipidemia, unspecified: Secondary | ICD-10-CM | POA: Diagnosis not present

## 2017-01-30 DIAGNOSIS — I10 Essential (primary) hypertension: Secondary | ICD-10-CM | POA: Diagnosis not present

## 2017-01-30 DIAGNOSIS — F5109 Other insomnia not due to a substance or known physiological condition: Secondary | ICD-10-CM | POA: Diagnosis not present

## 2017-01-30 DIAGNOSIS — G301 Alzheimer's disease with late onset: Secondary | ICD-10-CM | POA: Diagnosis not present

## 2017-01-30 DIAGNOSIS — R1310 Dysphagia, unspecified: Secondary | ICD-10-CM | POA: Diagnosis not present

## 2017-01-30 DIAGNOSIS — Z993 Dependence on wheelchair: Secondary | ICD-10-CM | POA: Diagnosis not present

## 2017-02-02 ENCOUNTER — Observation Stay (HOSPITAL_COMMUNITY): Payer: Medicare Other

## 2017-02-02 ENCOUNTER — Emergency Department (HOSPITAL_COMMUNITY): Payer: Medicare Other

## 2017-02-02 ENCOUNTER — Inpatient Hospital Stay (HOSPITAL_COMMUNITY)
Admission: EM | Admit: 2017-02-02 | Discharge: 2017-02-06 | DRG: 065 | Disposition: A | Discharge status: Skilled Nursing Facility | Payer: Medicare Other | Attending: Family Medicine | Admitting: Family Medicine

## 2017-02-02 ENCOUNTER — Encounter (HOSPITAL_COMMUNITY): Payer: Self-pay

## 2017-02-02 DIAGNOSIS — G8194 Hemiplegia, unspecified affecting left nondominant side: Secondary | ICD-10-CM | POA: Diagnosis present

## 2017-02-02 DIAGNOSIS — E1151 Type 2 diabetes mellitus with diabetic peripheral angiopathy without gangrene: Secondary | ICD-10-CM | POA: Diagnosis not present

## 2017-02-02 DIAGNOSIS — R531 Weakness: Secondary | ICD-10-CM | POA: Diagnosis not present

## 2017-02-02 DIAGNOSIS — R030 Elevated blood-pressure reading, without diagnosis of hypertension: Secondary | ICD-10-CM | POA: Diagnosis not present

## 2017-02-02 DIAGNOSIS — R29701 NIHSS score 1: Secondary | ICD-10-CM | POA: Diagnosis present

## 2017-02-02 DIAGNOSIS — Z7982 Long term (current) use of aspirin: Secondary | ICD-10-CM | POA: Diagnosis not present

## 2017-02-02 DIAGNOSIS — I119 Hypertensive heart disease without heart failure: Secondary | ICD-10-CM | POA: Diagnosis present

## 2017-02-02 DIAGNOSIS — R2990 Unspecified symptoms and signs involving the nervous system: Secondary | ICD-10-CM

## 2017-02-02 DIAGNOSIS — J440 Chronic obstructive pulmonary disease with acute lower respiratory infection: Secondary | ICD-10-CM | POA: Diagnosis not present

## 2017-02-02 DIAGNOSIS — E785 Hyperlipidemia, unspecified: Secondary | ICD-10-CM | POA: Diagnosis present

## 2017-02-02 DIAGNOSIS — F0391 Unspecified dementia with behavioral disturbance: Secondary | ICD-10-CM | POA: Diagnosis present

## 2017-02-02 DIAGNOSIS — Z79899 Other long term (current) drug therapy: Secondary | ICD-10-CM

## 2017-02-02 DIAGNOSIS — R0682 Tachypnea, not elsewhere classified: Secondary | ICD-10-CM | POA: Diagnosis not present

## 2017-02-02 DIAGNOSIS — I639 Cerebral infarction, unspecified: Secondary | ICD-10-CM | POA: Diagnosis not present

## 2017-02-02 DIAGNOSIS — Z66 Do not resuscitate: Secondary | ICD-10-CM | POA: Diagnosis not present

## 2017-02-02 DIAGNOSIS — Z9049 Acquired absence of other specified parts of digestive tract: Secondary | ICD-10-CM

## 2017-02-02 DIAGNOSIS — J189 Pneumonia, unspecified organism: Secondary | ICD-10-CM | POA: Diagnosis present

## 2017-02-02 DIAGNOSIS — I10 Essential (primary) hypertension: Secondary | ICD-10-CM | POA: Diagnosis present

## 2017-02-02 DIAGNOSIS — R29818 Other symptoms and signs involving the nervous system: Secondary | ICD-10-CM | POA: Diagnosis not present

## 2017-02-02 DIAGNOSIS — M6281 Muscle weakness (generalized): Secondary | ICD-10-CM | POA: Diagnosis not present

## 2017-02-02 DIAGNOSIS — F03918 Unspecified dementia, unspecified severity, with other behavioral disturbance: Secondary | ICD-10-CM | POA: Diagnosis present

## 2017-02-02 HISTORY — DX: Chronic obstructive pulmonary disease, unspecified: J44.9

## 2017-02-02 HISTORY — DX: Unspecified dementia, unspecified severity, without behavioral disturbance, psychotic disturbance, mood disturbance, and anxiety: F03.90

## 2017-02-02 LAB — COMPREHENSIVE METABOLIC PANEL
ALT: 72 U/L — AB (ref 14–54)
AST: 55 U/L — AB (ref 15–41)
Albumin: 3.6 g/dL (ref 3.5–5.0)
Alkaline Phosphatase: 152 U/L — ABNORMAL HIGH (ref 38–126)
Anion gap: 9 (ref 5–15)
BILIRUBIN TOTAL: 0.7 mg/dL (ref 0.3–1.2)
BUN: 11 mg/dL (ref 6–20)
CHLORIDE: 99 mmol/L — AB (ref 101–111)
CO2: 28 mmol/L (ref 22–32)
CREATININE: 0.83 mg/dL (ref 0.44–1.00)
Calcium: 10.2 mg/dL (ref 8.9–10.3)
GFR calc Af Amer: >60 mL/min (ref >60)
GFR, EST NON AFRICAN AMERICAN: 59 mL/min — AB (ref >60)
Glucose, Bld: 179 mg/dL — ABNORMAL HIGH (ref 65–99)
Potassium: 3.9 mmol/L (ref 3.5–5.1)
Sodium: 136 mmol/L (ref 135–145)
TOTAL PROTEIN: 7.5 g/dL (ref 6.5–8.1)

## 2017-02-02 LAB — CREATININE, SERUM: Creatinine, Ser: 0.72 mg/dL (ref 0.44–1.00)

## 2017-02-02 LAB — CBC
HEMATOCRIT: 41 % (ref 36.0–46.0)
HEMATOCRIT: 39.6 % (ref 36.0–46.0)
HEMOGLOBIN: 13.9 g/dL (ref 12.0–15.0)
Hemoglobin: 13.3 g/dL (ref 12.0–15.0)
MCH: 31.4 pg (ref 26.0–34.0)
MCH: 31.1 pg (ref 26.0–34.0)
MCHC: 33.9 g/dL (ref 30.0–36.0)
MCHC: 33.6 g/dL (ref 30.0–36.0)
MCV: 92.8 fL (ref 78.0–100.0)
MCV: 92.5 fL (ref 78.0–100.0)
PLATELETS: 213 10*3/uL (ref 150–400)
Platelets: 214 10*3/uL (ref 150–400)
RBC: 4.42 MIL/uL (ref 3.87–5.11)
RBC: 4.28 MIL/uL (ref 3.87–5.11)
RDW: 13.6 % (ref 11.5–15.5)
RDW: 13.8 % (ref 11.5–15.5)
WBC: 8.7 10*3/uL (ref 4.0–10.5)
WBC: 9.5 10*3/uL (ref 4.0–10.5)

## 2017-02-02 LAB — I-STAT CHEM 8, ED
BUN: 13 mg/dL (ref 6–20)
CREATININE: 0.7 mg/dL (ref 0.44–1.00)
Calcium, Ion: 1.2 mmol/L (ref 1.15–1.40)
Chloride: 99 mmol/L — ABNORMAL LOW (ref 101–111)
Glucose, Bld: 182 mg/dL — ABNORMAL HIGH (ref 65–99)
HEMATOCRIT: 42 % (ref 36.0–46.0)
Hemoglobin: 14.3 g/dL (ref 12.0–15.0)
Potassium: 3.8 mmol/L (ref 3.5–5.1)
Sodium: 136 mmol/L (ref 135–145)
TCO2: 29 mmol/L (ref 0–100)

## 2017-02-02 LAB — DIFFERENTIAL
BASOS ABS: 0 10*3/uL (ref 0.0–0.1)
Basophils Relative: 0 %
Eosinophils Absolute: 0.2 10*3/uL (ref 0.0–0.7)
Eosinophils Relative: 2 %
LYMPHS ABS: 1.8 10*3/uL (ref 0.7–4.0)
Lymphocytes Relative: 21 %
MONOS PCT: 6 %
Monocytes Absolute: 0.5 10*3/uL (ref 0.1–1.0)
NEUTROS ABS: 6.1 10*3/uL (ref 1.7–7.7)
Neutrophils Relative %: 71 %

## 2017-02-02 LAB — I-STAT TROPONIN, ED: TROPONIN I, POC: 0.02 ng/mL (ref 0.00–0.08)

## 2017-02-02 LAB — PROCALCITONIN: PROCALCITONIN: 0.11 ng/mL

## 2017-02-02 LAB — PROTIME-INR
INR: 0.94
Prothrombin Time: 12.6 seconds (ref 11.4–15.2)

## 2017-02-02 LAB — APTT: APTT: 35 s (ref 24–36)

## 2017-02-02 LAB — CBG MONITORING, ED: Glucose-Capillary: 165 mg/dL — ABNORMAL HIGH (ref 65–99)

## 2017-02-02 MED ORDER — TRAMADOL HCL 50 MG PO TABS: 50.0000 mg | ORAL_TABLET | Freq: Four times a day (QID) | ORAL | Status: DC | PRN

## 2017-02-02 MED ORDER — RISPERIDONE 0.5 MG PO TABS
0.5000 mg | ORAL_TABLET | Freq: Every day | ORAL | Status: DC
  Administered 2017-02-02: 0.5 mg via ORAL
  Filled 2017-02-02: qty 1

## 2017-02-02 MED ORDER — MELOXICAM 7.5 MG PO TABS
7.5000 mg | ORAL_TABLET | Freq: Every day | ORAL | Status: DC
  Administered 2017-02-03: 7.5 mg via ORAL
  Filled 2017-02-02 (×2): qty 1

## 2017-02-02 MED ORDER — TRAZODONE HCL 50 MG PO TABS
50.0000 mg | ORAL_TABLET | Freq: Every evening | ORAL | Status: DC | PRN
  Administered 2017-02-03 – 2017-02-05 (×3): 50 mg via ORAL
  Filled 2017-02-02 (×3): qty 1

## 2017-02-02 MED ORDER — STROKE: EARLY STAGES OF RECOVERY BOOK
Freq: Once | Status: DC
  Filled 2017-02-02 (×2): qty 1

## 2017-02-02 MED ORDER — HEPARIN SODIUM (PORCINE) 5000 UNIT/ML IJ SOLN
5000.0000 [IU] | Freq: Three times a day (TID) | INTRAMUSCULAR | Status: DC
  Administered 2017-02-02 – 2017-02-06 (×13): 5000 [IU] via SUBCUTANEOUS
  Filled 2017-02-02 (×12): qty 1

## 2017-02-02 MED ORDER — ONDANSETRON HCL 4 MG/2ML IJ SOLN: 4.0000 mg | Freq: Four times a day (QID) | INTRAMUSCULAR | Status: DC | PRN

## 2017-02-02 MED ORDER — ONDANSETRON HCL 4 MG PO TABS: 4.0000 mg | ORAL_TABLET | Freq: Four times a day (QID) | ORAL | Status: DC | PRN

## 2017-02-02 MED ORDER — WHITE PETROLATUM GEL
Status: AC
  Administered 2017-02-02: 19:00:00
  Filled 2017-02-02: qty 1

## 2017-02-02 MED ORDER — LATANOPROST 0.005 % OP SOLN
1.0000 [drp] | Freq: Every day | OPHTHALMIC | Status: DC
  Administered 2017-02-02 – 2017-02-05 (×4): 1 [drp] via OPHTHALMIC
  Filled 2017-02-02 (×2): qty 2.5

## 2017-02-02 MED ORDER — POLYETHYLENE GLYCOL 3350 17 G PO PACK: 17.0000 g | PACK | Freq: Every day | ORAL | Status: DC | PRN

## 2017-02-02 MED ORDER — SODIUM CHLORIDE 0.9% FLUSH
3.0000 mL | Freq: Two times a day (BID) | INTRAVENOUS | Status: DC
  Administered 2017-02-02 – 2017-02-06 (×8): 3 mL via INTRAVENOUS

## 2017-02-02 MED ORDER — SIMVASTATIN 20 MG PO TABS
20.0000 mg | ORAL_TABLET | Freq: Every day | ORAL | Status: DC
  Administered 2017-02-03: 20 mg via ORAL
  Filled 2017-02-02 (×2): qty 1

## 2017-02-02 MED ORDER — IPRATROPIUM-ALBUTEROL 0.5-2.5 (3) MG/3ML IN SOLN: 3.0000 mL | RESPIRATORY_TRACT | Status: DC | PRN

## 2017-02-02 NOTE — Progress Notes (Signed)
STROKE TEAM PROGRESS NOTE   HISTORY OF PRESENT ILLNESS (per record) Katie Mcfarland is an 83 y.o. female who presents from her SNF with acute onset of left sided weakness, arm worse than leg. She has a diagnosis of dementia but was described as being fully oriented and not confused per EMS. She herself denies a history of memory problems. LKW at 0230 02/02/2017. Staff noted her to be weak at 6:15 AM. Patient was not administered IV t-PA secondary to delay in arrival. She was admitted for further evaluation and treatment.   SUBJECTIVE (INTERVAL HISTORY) Granddaughter is at bedside. She still has left sided weakness, arm > leg. MRI showed small right CR infarct. Not taking ASA or statin at home.    OBJECTIVE Temp:  [98.3 F (36.8 C)-99.1 F (37.3 C)] 98.3 F (36.8 C) (03/02 0911) Pulse Rate:  [70-91] 74 (03/02 0911) Cardiac Rhythm: Normal sinus rhythm (03/02 0600) Resp:  [18-20] 18 (03/02 0911) BP: (170-198)/(68-92) 174/68 (03/02 0911) SpO2:  [95 %-96 %] 96 % (03/02 0911)  CBC:  Recent Labs Lab 02/02/17 0657  02/02/17 1055 02/03/17 0311  WBC 8.7  --  9.5 6.9  NEUTROABS 6.1  --   --  4.4  HGB 13.9  < > 13.3 13.3  HCT 41.0  < > 39.6 39.3  MCV 92.8  --  92.5 92.9  PLT 214  --  213 195  < > = values in this interval not displayed.  Basic Metabolic Panel:   Recent Labs Lab 02/02/17 0657 02/02/17 0703 02/02/17 1055 02/03/17 0311  NA 136 136  --  137  K 3.9 3.8  --  3.6  CL 99* 99*  --  98*  CO2 28  --   --  26  GLUCOSE 179* 182*  --  153*  BUN 11 13  --  9  CREATININE 0.83 0.70 0.72 0.84  CALCIUM 10.2  --   --  10.2    Lipid Panel:     Component Value Date/Time   CHOL 285 (H) 02/03/2017 0311   TRIG 250 (H) 02/03/2017 0311   HDL 58 02/03/2017 0311   CHOLHDL 4.9 02/03/2017 0311   VLDL 50 (H) 02/03/2017 0311   LDLCALC 177 (H) 02/03/2017 0311   HgbA1c: No results found for: HGBA1C Urine Drug Screen: No results found for: LABOPIA, COCAINSCRNUR, LABBENZ,  AMPHETMU, THCU, LABBARB    IMAGING I have personally reviewed the radiological images below and agree with the radiology interpretations.  Dg Chest 2 View 02/02/2017 Cardiomegaly without pulmonary edema. Left lower lobe subsegmental atelectasis or early pneumonia. Thoracic aortic atherosclerosis.   Ct Head Code Stroke W/o Cm 02/02/2017 1. Atrophy and small vessel disease with evidence of remote supratentorial and infratentorial ischemia. Subtle asymmetry of hypoattenuation, RIGHT insula versus LEFT could represent early infarction. Possible hyperdense RIGHT MCA branch, probably M3, in the sylvian fissure. 2. ASPECTS is 9.   Mr Brain 31Wo Contrast Mr Angiogram Head Wo Contrast 02/02/2017 1 cm acute infarction in the right hemispheric deep white matter adjacent to the posterior body of the right lateral ventricle. Old small vessel infarctions and chronic microangiopathic changes. Negative intracranial MR angiography of the large and medium size vessels. Ventricular prominence, probably largely due to central atrophy. Question if this is slightly progressive since 2016. Is there any clinical sign of normal pressure hydrocephalus?   CUS - Bilateral 1-39% ICA stenosis,tortuous vessels bilaterally, difficult exam.  TTE - Left ventricle: The cavity size was normal. Wall thickness was  increased in a pattern of moderate LVH. Systolic function was   vigorous. The estimated ejection fraction was in the range of 65%   to 70%. Wall motion was normal; there were no regional wall   motion abnormalities. Doppler parameters are consistent with   abnormal left ventricular relaxation (grade 1 diastolic   dysfunction). - Aortic valve: There was trivial regurgitation. - Aortic root: The aortic root was mildly dilated. Impressions: - Technically difficult; definity used; vigorous LV systolic   function; moderate LVH; elevated LVOT gradient of approximately 2   m/s likely related to vigorous LV systolic  function.   PHYSICAL EXAM  Temp:  [98.3 F (36.8 C)-99.1 F (37.3 C)] 98.4 F (36.9 C) (03/02 1339) Pulse Rate:  [70-82] 72 (03/02 1339) Resp:  [18-20] 20 (03/02 1339) BP: (170-198)/(68-75) 176/72 (03/02 1339) SpO2:  [95 %-96 %] 96 % (03/02 1339)  General - Well nourished, well developed, in no apparent distress.  Ophthalmologic - Sharp disc margins OU.   Cardiovascular - Regular rate and rhythm.  Mental Status -  Level of arousal and orientation to time, place, and person were intact. Language including expression, naming, repetition, comprehension was assessed and found intact. Fund of Knowledge was assessed and was intact.  Cranial Nerves II - XII - II - Visual field intact OU. III, IV, VI - Extraocular movements intact. V - Facial sensation intact bilaterally. VII - left nasolabial fold mild flattening. VIII - Hearing & vestibular intact bilaterally. X - Palate elevates symmetrically. XI - Chin turning & shoulder shrug intact bilaterally. XII - Tongue protrusion intact.  Motor Strength - The patient's strength was normal in RUE and RLE, however, LUE 3/5 and LLE 4+/5 proximal and 4/5 distal.  Bulk was normal and fasciculations were absent.   Motor Tone - Muscle tone was assessed at the neck and appendages and was normal.  Reflexes - The patient's reflexes were 1+ in all extremities and she had no pathological reflexes.  Sensory - Light touch, temperature/pinprick were assessed and were symmetrical.    Coordination - The patient had normal movements in the right hand with no ataxia or dysmetria.  Tremor was absent.  Gait and Station - not tested.   ASSESSMENT/PLAN Ms. Katie Mcfarland is a 83 y.o. female with history of COPD, dementia, HTN, HLD presenting with left arm weakness and confusion. She did not receive IV t-PA due to delay in arrival.   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 Infarct. Possible hyperdense R MCA M3.  MRI  R CR small infarct.   MRA  Unremarkable   Carotid Doppler  unremarkable  2D Echo  EF 65-70%  LDL 177  HgbA1c pending  Heparin 5000 units sq tid for VTE prophylaxis DIET DYS 3 Room service appropriate? Yes; Fluid consistency: Thin  No antithrombotic prior to admission, now on aspirin 325 mg daily. Continue ASA on discharge.  Patient counseled to be compliant with her antithrombotic medications  Ongoing aggressive stroke risk factor management  Therapy recommendations:  SNF  Disposition:  pending (from [facility])  Hypertension  Stable  Permissive hypertension (OK if < 220/120) but gradually normalize in 5-7 days  Long-term BP goal normotensive  Hyperlipidemia  Home meds:  No statin  LDL 177, goal < 70  Add lipitor 40mg   Continue statin at discharge  Other Stroke Risk Factors  Advanced age  Other Active Problems  CAP  COPD  Dementia with behavioral disturbance  Hospital day # 0  Neurology will sign off. Please call with questions. Pt will follow up with Darrol Angel NP at [facility] in about 6 weeks. Thanks for the consult.  Marvel Plan, MD PhD Stroke Neurology 02/03/2017 4:34 PM

## 2017-02-02 NOTE — ED Notes (Signed)
Pt taken to MRI  

## 2017-02-02 NOTE — ED Notes (Signed)
Pt taken to Xray.

## 2017-02-02 NOTE — Evaluation (Signed)
Clinical/Bedside Swallow Evaluation Patient Details  Name: Katie RichesMildred Schillinger Saunders MRN: [id number] Date of Birth: 02-Feb-1920  Today's Date: 02/02/2017 Time: SLP Start Time (ACUTE ONLY): 1546 SLP Stop Time (ACUTE ONLY): 1612 SLP Time Calculation (min) (ACUTE ONLY): 26 min  Past Medical History:  Past Medical History:  Diagnosis Date  . COPD (chronic obstructive pulmonary disease) (HCC)   . Dementia   . High cholesterol   . Hypertension    Past Surgical History:  Past Surgical History:  Procedure Laterality Date  . CHOLECYSTECTOMY     HPI:  Pt is a 83 y.o. female with PMH significant for COPD, dementia, HTN. Presented to ED by EMS on 02/02/17 with new onset of L-side arm weakness. MRI shows acute infarction in the R hemispheric deep white matter adjacent to posterior body of R lateral ventricle. CXR shows L lower lobe subsegmental atelectasis or early pneumonia. Prior BSE 09/2015 recommended Dys 3 diet and thin liquids with use of small, single sips. Daughter reports intermittent regurgitation of meds in the morning.   Assessment / Plan / Recommendation Clinical Impression  Pt's swallow appears to occur swiftly with consistent exhalation post-swallow, although she does have intermittent SOB during intake. As PO trials progressed, she started to have intermittent, dry throat clearing that was noted regardless of consistency trialed. Only cough was noted x1 when pt started laughing with food still in her mouth. Throughout assessment her vocal quality remained clear and strong. Pt's daughter reports that this throat clearing occurs during meals at home as well, and that she occasionally will have regurgitation of meds taken early in the morning. Given all of the above, question potential esophageal component. Recommend initiation of Dys 3 diet and thin liquids with additional precautions including no straw, meds in puree, and small/single bites and sips. SLP will f/u for tolerance versus need for  isntrumental testing. Pt/daughter in agreement with plan. SLP Visit Diagnosis: Dysphagia, unspecified (R13.10)    Aspiration Risk  Mild aspiration risk    Diet Recommendation Dysphagia 3 (Mech soft);Thin liquid   Liquid Administration via: Cup;No straw Medication Administration: Whole meds with puree Supervision: Patient able to self feed;Full supervision/cueing for compensatory strategies Compensations: Slow rate;Small sips/bites;Follow solids with liquid Postural Changes: Seated upright at 90 degrees;Remain upright for at least 30 minutes after po intake    Other  Recommendations Oral Care Recommendations: Oral care BID   Follow up Recommendations  (tba)      Frequency and Duration min 2x/week  2 weeks       Prognosis Prognosis for Safe Diet Advancement: Good      Swallow Study   General HPI: Pt is a 83 y.o. female with PMH significant for COPD, dementia, HTN. Presented to ED by EMS on 02/02/17 with new onset of L-side arm weakness. MRI shows acute infarction in the R hemispheric deep white matter adjacent to posterior body of R lateral ventricle. CXR shows L lower lobe subsegmental atelectasis or early pneumonia. Prior BSE 09/2015 recommended Dys 3 diet and thin liquids with use of small, single sips. Daughter reports intermittent regurgitation of meds in the morning. Type of Study: Bedside Swallow Evaluation Previous Swallow Assessment: see HPI Diet Prior to this Study: NPO Temperature Spikes Noted: No Respiratory Status: Room air History of Recent Intubation: No Behavior/Cognition: Alert;Cooperative;Pleasant mood Oral Cavity Assessment: Within Functional Limits Oral Care Completed by SLP: No Oral Cavity - Dentition: Adequate natural dentition Vision: Functional for self-feeding Self-Feeding Abilities: Able to feed self Patient Positioning: Upright in chair Baseline  Vocal Quality: Normal Volitional Cough: Strong Volitional Swallow: Able to elicit    Oral/Motor/Sensory  Function Overall Oral Motor/Sensory Function: Within functional limits   Ice Chips Ice chips: Not tested   Thin Liquid Thin Liquid: Impaired Presentation: Cup;Self Fed;Straw Pharyngeal  Phase Impairments: Throat Clearing - Immediate;Throat Clearing - Delayed    Nectar Thick Nectar Thick Liquid: Impaired Presentation: Cup;Self Fed;Straw Pharyngeal Phase Impairments: Throat Clearing - Immediate;Throat Clearing - Delayed   Honey Thick Honey Thick Liquid: Not tested   Puree Puree: Impaired Presentation: Self Fed;Spoon Pharyngeal Phase Impairments: Throat Clearing - Delayed   Solid   GO   Solid: Impaired Presentation: Self Fed Pharyngeal Phase Impairments: Cough - Immediate        Maxcine Ham 02/02/2017,4:39 PM  Maxcine Ham, M.A. CCC-SLP [phone]

## 2017-02-02 NOTE — Progress Notes (Signed)
Katie Mcfarland to ED via [facility] from [facility], during am rounds staff noted pt with new onset Left arm weakness. LKW 0230, NIHSS 1, CBG 182. Stroke downgraded, to be admitted to [facility].

## 2017-02-02 NOTE — ED Notes (Signed)
Admitting MD at the bedside.  

## 2017-02-02 NOTE — ED Provider Notes (Signed)
[facility]-EMERGENCY DEPT Provider Note   CSN: [id number] Arrival date & time: 02/02/17  7829   An emergency department physician performed an initial assessment on this suspected stroke patient at 43.  History   Chief Complaint Chief Complaint  Patient presents with  . Code Stroke    HPI Katie Mcfarland is a 83 y.o. female.  The history is provided by the EMS personnel and the nursing home.  Cerebrovascular Accident  This is a new problem. Episode onset: 4.5 hours PTA. The problem occurs constantly. The problem has not changed since onset.Associated symptoms comments: Left sided weakness and confusion. Nothing aggravates the symptoms. Nothing relieves the symptoms. She has tried nothing for the symptoms.    Past Medical History:  Diagnosis Date  . COPD (chronic obstructive pulmonary disease) (HCC)   . Dementia   . High cholesterol   . Hypertension     Patient Active Problem List   Diagnosis Date Noted  . HLD (hyperlipidemia) 09/19/2015  . HTN (hypertension) 09/19/2015  . Acute encephalopathy 09/19/2015  . Pneumonia 09/18/2015  . Dementia with behavioral disturbance 09/18/2015    Past Surgical History:  Procedure Laterality Date  . CHOLECYSTECTOMY      OB History    No data available       Home Medications    Prior to Admission medications   Medication Sig Start Date End Date Taking? Authorizing Provider  amLODipine (NORVASC) 5 MG tablet Take 5 mg by mouth daily.    Historical Provider, MD  azithromycin (ZITHROMAX) 200 MG/5ML suspension Take 12.5 mLs (500 mg total) by mouth daily. X 7days 09/20/15   Ripudeep Jenna Luo, MD  cefUROXime (CEFTIN) 250 MG/5ML suspension Take 10 mLs (500 mg total) by mouth 2 (two) times daily. X 7days 09/20/15   Ripudeep Jenna Luo, MD  Cholecalciferol (VITAMIN D-3 PO) Take 1 tablet by mouth daily.     Historical Provider, MD  indapamide (LOZOL) 2.5 MG tablet Take 2.5 mg by mouth daily.    Historical Provider, MD  latanoprost  (XALATAN) 0.005 % ophthalmic solution Place 1 drop into both eyes at bedtime.     Historical Provider, MD  meloxicam (MOBIC) 7.5 MG tablet Take 7.5 mg by mouth daily.    Historical Provider, MD  Multiple Vitamins-Minerals (CENTRUM ADULTS PO) Take 1 tablet by mouth daily.     Historical Provider, MD  risperiDONE (RISPERDAL) 0.5 MG tablet Take 0.5 mg by mouth at bedtime.    Historical Provider, MD  simvastatin (ZOCOR) 20 MG tablet Take 20 mg by mouth daily.    Historical Provider, MD  traMADol (ULTRAM) 50 MG tablet Take 50 mg by mouth every 6 (six) hours as needed for moderate pain.     Historical Provider, MD  traZODone (DESYREL) 50 MG tablet Take 1 tablet (50 mg total) by mouth at bedtime as needed for sleep. 09/20/15   Ripudeep Jenna Luo, MD    Family History No family history on file.  Social History Social History  Substance Use Topics  . Smoking status: Never Smoker  . Smokeless tobacco: Not on file  . Alcohol use No     Allergies   Patient has no known allergies.   Review of Systems Review of Systems  All other systems reviewed and are negative.    Physical Exam Updated Vital Signs BP 193/93 (BP Location: Right Arm)   Pulse 70   Temp 97.9 F (36.6 C) (Oral)   Resp 19   SpO2 96%   Physical Exam  Constitutional: She is oriented to person, place, and time. She appears well-developed and well-nourished. No distress.  HENT:  Head: Normocephalic.  Nose: Nose normal.  Eyes: Conjunctivae are normal.  Neck: Neck supple. No tracheal deviation present.  Cardiovascular: Normal rate, regular rhythm and normal heart sounds.   Pulmonary/Chest: Effort normal. No respiratory distress. She has wheezes (faint end-expiratory on right).  Abdominal: Soft. She exhibits no distension.  Neurological: She is alert and oriented to person, place, and time. No cranial nerve deficit or sensory deficit. GCS eye subscore is 4. GCS verbal subscore is 5. GCS motor subscore is 6.  4/5 strength LLE,  2/5 strength LLE, 5/5 strength RU&LE. Normal finger to nose testing on right, impaired on left   Skin: Skin is warm and dry.  Psychiatric: She has a normal mood and affect.  Vitals reviewed.    ED Treatments / Results  Labs (all labs ordered are listed, but only abnormal results are displayed) Labs Reviewed  COMPREHENSIVE METABOLIC PANEL - Abnormal; Notable for the following:       Result Value   Chloride 99 (*)    Glucose, Bld 179 (*)    AST 55 (*)    ALT 72 (*)    Alkaline Phosphatase 152 (*)    GFR calc non Af Amer 59 (*)    All other components within normal limits  CBG MONITORING, ED - Abnormal; Notable for the following:    Glucose-Capillary 165 (*)    All other components within normal limits  I-STAT CHEM 8, ED - Abnormal; Notable for the following:    Chloride 99 (*)    Glucose, Bld 182 (*)    All other components within normal limits  PROTIME-INR  APTT  CBC  DIFFERENTIAL  I-STAT TROPOININ, ED    EKG  EKG Interpretation  Date/Time:  Thursday February 02 2017 07:19:29 EST Ventricular Rate:  90 PR Interval:    QRS Duration: 87 QT Interval:  379 QTC Calculation: 462 R Axis:   -4 Text Interpretation:  Sinus rhythm Nonspecific T abnormalities, inferior leads New since previous tracing Baseline wander in lead(s) V3 Interpretation limited secondary to artifact Otherwise no significant change Confirmed by Lanijah Warzecha MD, Yohana Bartha [phone]) on 02/02/2017 7:44:35 AM       Radiology Dg Chest 2 View  Result Date: 02/02/2017 CLINICAL DATA:  Left-sided weakness, possible CVA, nonsmoker. History of dementia and encephalopathy and COPD. EXAM: CHEST  2 VIEW COMPARISON:  Portable chest x-ray of September 18, 2015 FINDINGS: The lungs are reasonably well inflated. There is mildly increased density in the left lower lobe. There is no pleural effusion or pneumothorax. The cardiac silhouette is enlarged. The pulmonary vascularity is not engorged. There is calcification in the wall of the thoracic  aorta. The bony thorax exhibits no acute abnormality. There is multilevel degenerative disc disease of the thoracic spine. IMPRESSION: Cardiomegaly without pulmonary edema. Left lower lobe subsegmental atelectasis or early pneumonia. Thoracic aortic atherosclerosis. Electronically Signed   By: David  [place] M.D.   On: 02/02/2017 07:57   Mr Angiogram Head Wo Contrast  Result Date: 02/02/2017 CLINICAL DATA:  Dementia. Acute presentation with left-sided weakness. EXAM: MRI HEAD WITHOUT CONTRAST MRA HEAD WITHOUT CONTRAST TECHNIQUE: Multiplanar, multiecho pulse sequences of the brain and surrounding structures were obtained without intravenous contrast. Angiographic images of the head were obtained using MRA technique without contrast. COMPARISON:  Head CT same day and 09/18/2015 FINDINGS: MRI HEAD FINDINGS Brain: Diffusion imaging shows a 1 cm acute infarction in the  deep white matter adjacent to the posterior body of the right lateral ventricle. No other acute infarction. No evidence of large vessel territory infarction. Brainstem is normal. There are old small vessel cerebellar infarctions bilaterally. Cerebral hemispheres show old infarction in the right external capsule and old cortical and subcortical infarction in the right posterior frontal region. There are moderate chronic small-vessel ischemic changes throughout the white matter. Ventricles are prominent, consistent with central atrophy. Question slight increase in ventricular prominence since 2016, raising at least the question of normal pressure hydrocephalus. Vascular: Major vessels at the base of the brain show flow. Skull and upper cervical spine: Negative Sinuses/Orbits: Clear/normal Other: None significant MRA HEAD FINDINGS Both internal carotid arteries are patent into the brain. No siphon stenosis. The anterior and middle cerebral vessels are patent without proximal stenosis, aneurysm or vascular malformation. Both vertebral arteries are patent to  the basilar. No basilar stenosis. Posterior circulation branch vessels are normal proximally. More distal intracranial vessels show some atherosclerotic irregularity diffusely. IMPRESSION: 1 cm acute infarction in the right hemispheric deep white matter adjacent to the posterior body of the right lateral ventricle. Old small vessel infarctions and chronic microangiopathic changes. Negative intracranial MR angiography of the large and medium size vessels. Ventricular prominence, probably largely due to central atrophy. Question if this is slightly progressive since 2016. Is there any clinical sign of normal pressure hydrocephalus? Electronically Signed   By: Paulina Fusi M.D.   On: 02/02/2017 10:01   Mr Brain Wo Contrast  Result Date: 02/02/2017 CLINICAL DATA:  Dementia. Acute presentation with left-sided weakness. EXAM: MRI HEAD WITHOUT CONTRAST MRA HEAD WITHOUT CONTRAST TECHNIQUE: Multiplanar, multiecho pulse sequences of the brain and surrounding structures were obtained without intravenous contrast. Angiographic images of the head were obtained using MRA technique without contrast. COMPARISON:  Head CT same day and 09/18/2015 FINDINGS: MRI HEAD FINDINGS Brain: Diffusion imaging shows a 1 cm acute infarction in the deep white matter adjacent to the posterior body of the right lateral ventricle. No other acute infarction. No evidence of large vessel territory infarction. Brainstem is normal. There are old small vessel cerebellar infarctions bilaterally. Cerebral hemispheres show old infarction in the right external capsule and old cortical and subcortical infarction in the right posterior frontal region. There are moderate chronic small-vessel ischemic changes throughout the white matter. Ventricles are prominent, consistent with central atrophy. Question slight increase in ventricular prominence since 2016, raising at least the question of normal pressure hydrocephalus. Vascular: Major vessels at the base of  the brain show flow. Skull and upper cervical spine: Negative Sinuses/Orbits: Clear/normal Other: None significant MRA HEAD FINDINGS Both internal carotid arteries are patent into the brain. No siphon stenosis. The anterior and middle cerebral vessels are patent without proximal stenosis, aneurysm or vascular malformation. Both vertebral arteries are patent to the basilar. No basilar stenosis. Posterior circulation branch vessels are normal proximally. More distal intracranial vessels show some atherosclerotic irregularity diffusely. IMPRESSION: 1 cm acute infarction in the right hemispheric deep white matter adjacent to the posterior body of the right lateral ventricle. Old small vessel infarctions and chronic microangiopathic changes. Negative intracranial MR angiography of the large and medium size vessels. Ventricular prominence, probably largely due to central atrophy. Question if this is slightly progressive since 2016. Is there any clinical sign of normal pressure hydrocephalus? Electronically Signed   By: Paulina Fusi M.D.   On: 02/02/2017 10:01   Ct Head Code Stroke W/o Cm  Result Date: 02/02/2017 CLINICAL DATA:  Code stroke.  LEFT-sided weakness. Last seen normal 02:30 a.m. EXAM: CT HEAD WITHOUT CONTRAST TECHNIQUE: Contiguous axial images were obtained from the base of the skull through the vertex without intravenous contrast. COMPARISON:  09/18/2015. FINDINGS: Brain: Generalized atrophy with chronic microvascular ischemic change. Slight asymmetric hypoattenuation of the RIGHT insula versus LEFT, loss of insular ribbon, could represent early infarction. No hemorrhage, mass lesion, or extra-axial fluid. Hydrocephalus ex vacuo. Moderate-sized remote RIGHT frontal cortical infarct. BILATERAL cerebellar infarcts, of a chronic nature. Vascular: There is no asymmetry of attenuation of the M1 MCA segments. Possible asymmetric hyperattenuation of a MCA branch in the sylvian fissure, so-called "dot sign", see  image 14 series 201. Skull: Normal. Negative for fracture or focal lesion. Sinuses/Orbits: No acute finding. Other: None. Compared with prior CT from 09/18/2015, the degree of atrophy is similar. RIGHT frontal infarction has progressed since that time. BILATERAL cerebellar infarcts are similar. ASPECTS [facility]([place] Stroke Program Early CT Score) - Ganglionic level infarction (caudate, lentiform nuclei, internal capsule, insula, M1-M3 cortex): 6(insula). - Supraganglionic infarction (M4-M6 cortex): 3 Total score (0-10 with 10 being normal): 9 IMPRESSION: 1. Atrophy and small vessel disease with evidence of remote supratentorial and infratentorial ischemia. Subtle asymmetry of hypoattenuation, RIGHT insula versus LEFT could represent early infarction. Possible hyperdense RIGHT MCA branch, probably M3, in the sylvian fissure. 2. ASPECTS is 9. These results were called by telephone at the time of interpretation on 02/02/2017 at 7:17 am to Dr. Otelia LimesLindzen , who verbally acknowledged these results. Electronically Signed   By: Elsie StainJohn T Curnes M.D.   On: 02/02/2017 07:22    Procedures Procedures (including critical care time)  CRITICAL CARE Performed by: Lyndal PulleyKnott, Mandell Pangborn Total critical care time: 30 minutes Critical care time was exclusive of separately billable procedures and treating other patients. Critical care was necessary to treat or prevent imminent or life-threatening deterioration. Critical care was time spent personally by me on the following activities: development of treatment plan with patient and/or surrogate as well as nursing, discussions with consultants, evaluation of patient's response to treatment, examination of patient, obtaining history from patient or surrogate, ordering and performing treatments and interventions, ordering and review of laboratory studies, ordering and review of radiographic studies, pulse oximetry and re-evaluation of patient's condition.  Medications Ordered in ED Medications - No  data to display   Initial Impression / Assessment and Plan / ED Course  I have reviewed the triage vital signs and the nursing notes.  Pertinent labs & imaging results that were available during my care of the patient were reviewed by me and considered in my medical decision making (see chart for details).     83 y.o. female presents with new symptoms of left arm weakness first noticed this morning. Considered tPA for new deficit but just outside of window for treatment. No bleed on scan. Subtle hypoattenuation suggesting evolving acute stroke. Neurology at bedside on arrival and recommending admission for completion of stroke workup. MR brain and MRA head ordered per their recommendations. Protecting airway appropriately, monitored closely with risk of decompensation without change in clinical status. . Stable for tele admission.   Hospitalist was consulted for admission and will see the patient in the emergency department. Family is reporting some difficulty swallowing that has been occurring recently as well as weight gain which has been unexplained. This will be further worked up on inpatient admission.  Final Clinical Impressions(s) / ED Diagnoses   Final diagnoses:  Stroke-like symptoms  Left-sided weakness  Acute ischemic stroke [facility](HCC)    New  Prescriptions New Prescriptions   No medications on file     Lyndal Pulley, MD 02/02/17 1014

## 2017-02-02 NOTE — ED Notes (Addendum)
Pt in MRI. Pt to go to Floor from MRI. Transport to take patient straight to the floor

## 2017-02-02 NOTE — Consult Note (Signed)
Referring Physician: Dr. Clydene PughKnott    Chief Complaint: Acute onset of left sided weakness  HPI: Katie Mcfarland is an 83 y.o. female who presents from her SNF with acute onset of left sided weakness, arm worse than leg. She has a diagnosis of dementia but was described as being fully oriented and not confused per EMS. She herself denies a history of memory problems. LKN at 0230. Staff noted her to be weak at 6:15 AM.  LSN: [id number] tPA Given: No. Out of time window  Past Medical History:  Diagnosis Date  . High cholesterol   . Hypertension     Past Surgical History:  Procedure Laterality Date  . CHOLECYSTECTOMY      No family history on file. Social History:  reports that she has never smoked. She does not have any smokeless tobacco history on file. She reports that she does not drink alcohol or use drugs.  Allergies: No Known Allergies  Medications prior to admission:  Amlodipine Multivitamin  Fexofenadine Furosemide Hydrocodone/APAP Fiberlax Vitamin D3 Wine 4 oz qhs prn  ROS: No complaints at time of Neurological evaluation, except for LUE weakness.   Physical Examination: There were no vitals taken for this visit.  HEENT: [place]/AT Lungs: Respirations unlabored.  Ext: No cyanosis.   Neurologic Examination: Ment: Awake and alert. Oriented x 4. Speech fluent with intact comprehension, naming and repetition. Good eye contact. Pleasant and cooperative. CN: PERRL. Visual fields intact. EOMI. No nystagmus. Equivocal mild left facial droop. Facial temp sensation equal bilaterally. Hearing intact to questions and commands. No hypophonia. XI - symmetric. Tongue protrudes midline.  Motor: 5/5 RUE and RLE. 4+/5 LLE. 2-3/5 LUE.  Sensory: Intact temperature and FT sensation. No extinction. Reflexes: Hypoactive lower extremity reflexes. 2+ upper extremities. Left toe upgoing, right toe downgoing.  Cerebellar: Mild intention tremor on right, without ataxia. Unable to perform FNF  on left.  Gait: Deferred due to falls risk concerns.   Results for orders placed or performed during the hospital encounter of 02/02/17 (from the past 48 hour(s))  CBC     Status: None   Collection Time: 02/02/17  6:57 AM  Result Value Ref Range   WBC 8.7 4.0 - 10.5 K/uL   RBC 4.42 3.87 - 5.11 MIL/uL   Hemoglobin 13.9 12.0 - 15.0 g/dL   HCT 16.141.0 09.636.0 - 04.546.0 %   MCV 92.8 78.0 - 100.0 fL   MCH 31.4 26.0 - 34.0 pg   MCHC 33.9 30.0 - 36.0 g/dL   RDW 40.913.6 81.111.5 - 91.415.5 %   Platelets 214 150 - 400 K/uL  Differential     Status: None   Collection Time: 02/02/17  6:57 AM  Result Value Ref Range   Neutrophils Relative % 71 %   Neutro Abs 6.1 1.7 - 7.7 K/uL   Lymphocytes Relative 21 %   Lymphs Abs 1.8 0.7 - 4.0 K/uL   Monocytes Relative 6 %   Monocytes Absolute 0.5 0.1 - 1.0 K/uL   Eosinophils Relative 2 %   Eosinophils Absolute 0.2 0.0 - 0.7 K/uL   Basophils Relative 0 %   Basophils Absolute 0.0 0.0 - 0.1 K/uL   No results found.  Assessment: 83 y.o. female with acute onset of left hemiparesis.  1. Out of IV tPA time window. Not an endovascular candidate due to low NIHSS of 1.  2. CT shows no acute abnormality. Small old cerebellar strokes and chronic microvascular ischemic changes are noted.  3. Listed history of dementia. Most  likely mild dementia and also could be MCI. No cognitive deficits noted on brief assessment of her mentation.  Plan: 1. HgbA1c, fasting lipid panel 2. MRI, MRA  of the brain without contrast 3. PT consult, OT consult, Speech consult 4. Echocardiogram 5. Carotid dopplers 6. Start ASA. 7. Risk factor modification 8. Telemetry monitoring 9. Frequent neuro checks 10. DNR per paperwork accompanying patient.  11. Permissive HTN.   @Electronically  signed: Dr. Caryl Pina  02/02/2017, 7:10 AM

## 2017-02-02 NOTE — Evaluation (Signed)
Physical Therapy Evaluation Patient Details Name: Katie RichesMildred Schillinger Finerty MRN: [id number] DOB: [birth year] Today's Date: 02/02/2017   History of Present Illness  Pt is a 83 y.o. female with PMH significant for COPD, dementia, HTN. Presented to ED by EMS on 02/02/17 with new onset of L-side arm weakness. MRI shows acute infarction in the R hemispheric deep white matter adjacent to posterior body of R lateral ventricle. CXR shows L lower lobe subsegmental atelectasis or early pneumonia.   Clinical Impression  Pt presents to PT with L-side weakness, deconditioning, impaired memory, and decreased problem solving secondary to above. PTA, pt residing in Memory Care unit at [facility], requiring assist for w/c mobility and all ADL/IADLs. Today, pt able to squat pivot transfer to chair with maxA +2. Daughter-in-law present to verify details regarding PLOF. Pt would benefit from continued acute PT services to maximize functional mobility, with HHPT follow-up at [facility].     Follow Up Recommendations Home health PT (return to [facility] with PT)    Equipment Recommendations  None recommended by PT    Recommendations for Other Services OT consult     Precautions / Restrictions Precautions Precautions: Fall Restrictions Weight Bearing Restrictions: No      Mobility  Bed Mobility Overal bed mobility: Needs Assistance Bed Mobility: Rolling;Sidelying to Sit Rolling: Mod assist Sidelying to sit: Mod assist       General bed mobility comments: ModA for rolling R/L x2 for pericare; trunk support and LE assist for sitting.   Transfers Overall transfer level: Needs assistance Equipment used: Rolling walker (2 wheeled) Transfers: Sit to/from [employer];Squat Pivot Transfers Sit to Stand: +2 safety/equipment;Max assist   Squat pivot transfers: Max assist;+2 safety/equipment     General transfer comment: Pt unable to stand fully upright with RW and maxA +2.  Difficulty managing LUE during transfer, requiring assist to lift/place on RW.  Ambulation/Gait                Stairs            Wheelchair Mobility    Modified Rankin (Stroke Patients Only) Modified Rankin (Stroke Patients Only) Pre-Morbid Rankin Score: Severe disability Modified Rankin: Severe disability     Balance Overall balance assessment: Needs assistance Sitting-balance support: Bilateral upper extremity supported;Feet supported Sitting balance-Leahy Scale: Fair Sitting balance - Comments: Pt able to maintain sitting balance with support for brief periods, with decreased trunk control and posterior lean.  Postural control: Posterior lean Standing balance support: Bilateral upper extremity supported Standing balance-Leahy Scale: Zero                               Pertinent Vitals/Pain Pain Assessment: No/denies pain    Home Living Family/patient expects to be discharged to:: Other (Comment)                 Additional Comments: Memory Care Unit of [facility]    Prior Function Level of Independence: Needs assistance   Gait / Transfers Assistance Needed: W/c for all mobility, requiring assist to squat pivot into lift chair. Pt unable to self-propel.  ADL's / Homemaking Assistance Needed: Requires assist for all ADLs/IADLs, including bathing and pericare.        Hand Dominance        Extremity/Trunk Assessment   Upper Extremity Assessment Upper Extremity Assessment: Decreased coordination in BUE finger-to-nose testing; Defer to OT evaluation    Lower  Extremity Assessment Lower Extremity Assessment: Generalized weakness;RLE deficits/detail;LLE deficits/detail RLE Deficits / Details: R hip flex 3/5, knee ext 4/5, knee flex 3/5, ankle DF/PF 4/5 LLE Deficits / Details: LLE grossly 2/5    Cervical / Trunk Assessment Cervical / Trunk Assessment: Kyphotic  Communication   Communication: No difficulties  Cognition  Arousal/Alertness: Awake/alert Behavior During Therapy: WFL for tasks assessed/performed Overall Cognitive Status: Impaired/Different from baseline Area of Impairment: Attention;Memory;Following commands;Awareness;Problem solving   Current Attention Level: Selective Memory: Decreased short-term memory Following Commands: Follows one step commands with increased time;Follows multi-step commands inconsistently   Awareness: Emergent Problem Solving: Slow processing;Difficulty sequencing;Requires verbal cues      General Comments      Exercises     Assessment/Plan    PT Assessment Patient needs continued PT services  PT Problem List Decreased strength;Decreased range of motion;Decreased activity tolerance;Decreased balance;Decreased mobility;Impaired sensation       PT Treatment Interventions Functional mobility training;Therapeutic activities;Therapeutic exercise;Balance training;Neuromuscular re-education;Patient/family education    PT Goals (Current goals can be found in the Care Plan section)  Acute Rehab PT Goals Patient Stated Goal: Get better PT Goal Formulation: With patient Time For Goal Achievement: 02/16/17 Potential to Achieve Goals: Fair    Frequency Min 3X/week   Barriers to discharge        Co-evaluation               End of Session Equipment Utilized During Treatment: Gait belt Activity Tolerance: Patient tolerated treatment well Patient left: in chair;with chair alarm set;with call bell/phone within reach Nurse Communication: Mobility status PT Visit Diagnosis: Muscle weakness (generalized) (M62.81);Other abnormalities of gait and mobility (R26.89)    Functional Assessment Tool Used: Clinical judgement Functional Limitation: Mobility: Walking and moving around Mobility: Walking and Moving Around Current Status (Z6109): At least 60 percent but less than 80 percent impaired, limited or restricted Mobility: Walking and Moving Around Goal Status  [phone]): At least 40 percent but less than 60 percent impaired, limited or restricted    Time: 1122-1148 PT Time Calculation (min) (ACUTE ONLY): 26 min   Charges:   PT Evaluation $PT Eval Moderate Complexity: 1 Procedure PT Treatments $Therapeutic Activity: 8-22 mins   PT G Codes:   PT G-Codes **NOT FOR INPATIENT CLASS** Functional Assessment Tool Used: Clinical judgement Functional Limitation: Mobility: Walking and moving around Mobility: Walking and Moving Around Current Status (U9811): At least 60 percent but less than 80 percent impaired, limited or restricted Mobility: Walking and Moving Around Goal Status [phone]): At least 40 percent but less than 60 percent impaired, limited or restricted   Dewayne Hatch, SPT Office-[phone]  Ina Homes 02/02/2017, 2:19 PM

## 2017-02-02 NOTE — ED Notes (Signed)
cbg was 165

## 2017-02-02 NOTE — H&P (Signed)
History and Physical    Katie Mcfarland MVH:[id number] DOB: [birth year] DOA: 02/02/2017  PCP: Londell MohPHARR,[name], MD  Patient coming from: SNF - [facility]  Chief Complaint:  HPI: Katie Mcfarland is a 83 y.o. female with medical history significant of COPD, dementia, HTN, Hyperlipidemia who presented to the ED by EMS with nre onset of left sided weakness if the left arm. Patient was seen normal last time around 2: 30 am and four hours later around 6:30 am staff found her to have significant weakness of the left arm and confusion.  ED Course: Upon arrival to the the ED Patient was seen by neurology, she was about of time widow for administration of TPA.Marland Kitchen. Her VS were stable and BP was on the higher end. Blood work was unremarkable. Head CT revealed atrophy and SVD ischemic changes with possible right insula early CVA Chest XRay showed LLL density - atelectasis vs early PNA   Review of Systems: As per HPI otherwise 10 point review of systems negative.   Ambulatory Status: wheelchair bound  Past Medical History:  Diagnosis Date  . COPD (chronic obstructive pulmonary disease) (HCC)   . Dementia   . High cholesterol   . Hypertension     Past Surgical History:  Procedure Laterality Date  . CHOLECYSTECTOMY      Social History   Social History  . Marital status: Single    Spouse name: N/A  . Number of children: N/A  . Years of education: N/A   Occupational History  . Not on file.   Social History Main Topics  . Smoking status: Never Smoker  . Smokeless tobacco: Not on file  . Alcohol use No  . Drug use: No  . Sexual activity: No   Other Topics Concern  . Not on file   Social History Narrative  . No narrative on file    No Known Allergies  No family history on file.  Prior to Admission medications   Medication Sig Start Date End Date Taking? Authorizing Provider  amLODipine (NORVASC) 5 MG tablet Take 5 mg by mouth daily.    Historical  Provider, MD  azithromycin (ZITHROMAX) 200 MG/5ML suspension Take 12.5 mLs (500 mg total) by mouth daily. X 7days 09/20/15   Ripudeep Jenna LuoK Rai, MD  cefUROXime (CEFTIN) 250 MG/5ML suspension Take 10 mLs (500 mg total) by mouth 2 (two) times daily. X 7days 09/20/15   Ripudeep Jenna LuoK Rai, MD  Cholecalciferol (VITAMIN D-3 PO) Take 1 tablet by mouth daily.     Historical Provider, MD  indapamide (LOZOL) 2.5 MG tablet Take 2.5 mg by mouth daily.    Historical Provider, MD  latanoprost (XALATAN) 0.005 % ophthalmic solution Place 1 drop into both eyes at bedtime.     Historical Provider, MD  meloxicam (MOBIC) 7.5 MG tablet Take 7.5 mg by mouth daily.    Historical Provider, MD  Multiple Vitamins-Minerals (CENTRUM ADULTS PO) Take 1 tablet by mouth daily.     Historical Provider, MD  risperiDONE (RISPERDAL) 0.5 MG tablet Take 0.5 mg by mouth at bedtime.    Historical Provider, MD  simvastatin (ZOCOR) 20 MG tablet Take 20 mg by mouth daily.    Historical Provider, MD  traMADol (ULTRAM) 50 MG tablet Take 50 mg by mouth every 6 (six) hours as needed for moderate pain.     Historical Provider, MD  traZODone (DESYREL) 50 MG tablet Take 1 tablet (50 mg total) by mouth at bedtime as needed for sleep. 09/20/15  Cathren Harsh, MD    Physical Exam: Vitals:   02/02/17 0721 02/02/17 0754 02/02/17 0800 02/02/17 0830  BP: 191/83 193/93 (!) 180/101 177/85  Pulse: 69 70 86 85  Resp: (!) 29 19 (!) 29 (!) 28  Temp: 97.9 F (36.6 C)     TempSrc: Oral     SpO2: 95% 96% 96% 96%     General: Appears calm and comfortable Eyes: PERRLA, EOMI, normal lids, iris ENT:  grossly normal hearing, lips & tongue, mucous membranes moist and intact Neck: no lymphoadenopathy, masses or thyromegaly Cardiovascular: RRR, no m/r/g. No JVD, carotid bruits. No LE edema.  Respiratory: bilateral no wheezes, rales, rhonchi or crackles, diminished breath sound on the left. Normal respiratory effort. No accessory muscle use observed Abdomen:  soft, non-tender, non-distended, no organomegaly or masses appreciated. BS present in all quadrants Skin: no rash, ulcers or induration seen on limited exam Musculoskeletal: grossly normal tone RUE and RLE, Left arm is significantly weaker and is drifting down with rising, no bony abnormality or joint deformities observed Psychiatric: grossly normal mood and affect, speech fluent and appropriate, alert and oriented x3 Neurologic: CN II-XII grossly intact, moves all extremities in coordinated fashion, sensation intact  Labs on Admission: I have personally reviewed following labs and imaging studies  CBC, BMP  GFR: CrCl cannot be calculated (Unknown ideal weight.).   Creatinine Clearance: CrCl cannot be calculated (Unknown ideal weight.).    Radiological Exams on Admission: Dg Chest 2 View  Result Date: 02/02/2017 CLINICAL DATA:  Left-sided weakness, possible CVA, nonsmoker. History of dementia and encephalopathy and COPD. EXAM: CHEST  2 VIEW COMPARISON:  Portable chest x-ray of September 18, 2015 FINDINGS: The lungs are reasonably well inflated. There is mildly increased density in the left lower lobe. There is no pleural effusion or pneumothorax. The cardiac silhouette is enlarged. The pulmonary vascularity is not engorged. There is calcification in the wall of the thoracic aorta. The bony thorax exhibits no acute abnormality. There is multilevel degenerative disc disease of the thoracic spine. IMPRESSION: Cardiomegaly without pulmonary edema. Left lower lobe subsegmental atelectasis or early pneumonia. Thoracic aortic atherosclerosis. Electronically Signed   By: David  [place] M.D.   On: 02/02/2017 07:57   Ct Head Code Stroke W/o Cm  Result Date: 02/02/2017 CLINICAL DATA:  Code stroke. LEFT-sided weakness. Last seen normal 02:30 a.m. EXAM: CT HEAD WITHOUT CONTRAST TECHNIQUE: Contiguous axial images were obtained from the base of the skull through the vertex without intravenous contrast.  COMPARISON:  09/18/2015. FINDINGS: Brain: Generalized atrophy with chronic microvascular ischemic change. Slight asymmetric hypoattenuation of the RIGHT insula versus LEFT, loss of insular ribbon, could represent early infarction. No hemorrhage, mass lesion, or extra-axial fluid. Hydrocephalus ex vacuo. Moderate-sized remote RIGHT frontal cortical infarct. BILATERAL cerebellar infarcts, of a chronic nature. Vascular: There is no asymmetry of attenuation of the M1 MCA segments. Possible asymmetric hyperattenuation of a MCA branch in the sylvian fissure, so-called "dot sign", see image 14 series 201. Skull: Normal. Negative for fracture or focal lesion. Sinuses/Orbits: No acute finding. Other: None. Compared with prior CT from 09/18/2015, the degree of atrophy is similar. RIGHT frontal infarction has progressed since that time. BILATERAL cerebellar infarcts are similar. ASPECTS [facility] Stroke Program Early CT Score) - Ganglionic level infarction (caudate, lentiform nuclei, internal capsule, insula, M1-M3 cortex): 6(insula). - Supraganglionic infarction (M4-M6 cortex): 3 Total score (0-10 with 10 being normal): 9 IMPRESSION: 1. Atrophy and small vessel disease with evidence of remote supratentorial and infratentorial ischemia. Subtle  asymmetry of hypoattenuation, RIGHT insula versus LEFT could represent early infarction. Possible hyperdense RIGHT MCA branch, probably M3, in the sylvian fissure. 2. ASPECTS is 9. These results were called by telephone at the time of interpretation on 02/02/2017 at 7:17 am to Dr. Otelia Limes , who verbally acknowledged these results. Electronically Signed   By: Elsie Stain M.D.   On: 02/02/2017 07:22    EKG: Independently reviewed - SR without acute ST-TW changes Assessment/Plan Principal Problem:   Acute ischemic stroke [facility]) Active Problems:   Pneumonia   Dementia with behavioral disturbance   HLD (hyperlipidemia)   HTN (hypertension)    Acute ischemic stroke  Will obtain  brain MRI /MRA, carotid US, 2D Echo  Patient will be seen by neurology, PT/OT Continue to provide supportive care and monitor for safety   CAP in patient with known COPD Chest Xray revealed LLL density, but patient does not have fever or elevated WBC"s count, but was tachypneic Will start IV Rocephin and Zithromax, nebulizer treatments prn Obtain Procalcitonin and monitor WBC count If inflammatory markers are normal, consider stopping antibiotic therapy  HTN Patient arrived with accelerated BP - 181/101 mmHg Will hold BP meds secondary to permissive HTN of 220/110 mm HG  Hyperlipidemia Continue statin  Dementia with behavioral disturbance Continue outpatient medications Provide supportive care    DVT prophylaxis: Heparin Code Status: DNR Family Communication: at bedside Disposition Plan: telemetry Consults called: neurology by EDP Admission status: observation   Raymon Mutton, PA-C Pager: [phone] [facility]  If 7PM-7AM, please contact night-coverage [email or web address] Password [email or web address]  02/02/2017, 9:03 AM

## 2017-02-02 NOTE — ED Notes (Signed)
Pt returned from X-ray.  

## 2017-02-02 NOTE — ED Triage Notes (Signed)
Pt arrived from [facility] nursing facility via EMS; pt was last seen normal by staff at 0230 am; pt discovered by staff around 0630 with  noticialby weaker in left arm; pt presents a&ox 4 on arrival but has some spells of confusion due to hx of dementia via EMS; Pt has clear speech and no pain on arrival. Pt has hx of COPD; Pt arrives with Yellow DNR form at bedside;

## 2017-02-03 ENCOUNTER — Observation Stay (HOSPITAL_BASED_OUTPATIENT_CLINIC_OR_DEPARTMENT_OTHER): Payer: Medicare Other

## 2017-02-03 ENCOUNTER — Encounter (HOSPITAL_COMMUNITY): Payer: Self-pay | Admitting: *Deleted

## 2017-02-03 DIAGNOSIS — R531 Weakness: Secondary | ICD-10-CM | POA: Diagnosis not present

## 2017-02-03 DIAGNOSIS — E785 Hyperlipidemia, unspecified: Secondary | ICD-10-CM | POA: Diagnosis not present

## 2017-02-03 DIAGNOSIS — J440 Chronic obstructive pulmonary disease with acute lower respiratory infection: Secondary | ICD-10-CM | POA: Diagnosis present

## 2017-02-03 DIAGNOSIS — G8194 Hemiplegia, unspecified affecting left nondominant side: Secondary | ICD-10-CM | POA: Diagnosis present

## 2017-02-03 DIAGNOSIS — I639 Cerebral infarction, unspecified: Secondary | ICD-10-CM | POA: Diagnosis not present

## 2017-02-03 DIAGNOSIS — F0391 Unspecified dementia with behavioral disturbance: Secondary | ICD-10-CM | POA: Diagnosis not present

## 2017-02-03 DIAGNOSIS — Z9049 Acquired absence of other specified parts of digestive tract: Secondary | ICD-10-CM | POA: Diagnosis not present

## 2017-02-03 DIAGNOSIS — Z79899 Other long term (current) drug therapy: Secondary | ICD-10-CM | POA: Diagnosis not present

## 2017-02-03 DIAGNOSIS — R0682 Tachypnea, not elsewhere classified: Secondary | ICD-10-CM | POA: Diagnosis present

## 2017-02-03 DIAGNOSIS — I119 Hypertensive heart disease without heart failure: Secondary | ICD-10-CM | POA: Diagnosis present

## 2017-02-03 DIAGNOSIS — R29701 NIHSS score 1: Secondary | ICD-10-CM | POA: Diagnosis present

## 2017-02-03 DIAGNOSIS — I6789 Other cerebrovascular disease: Secondary | ICD-10-CM | POA: Diagnosis not present

## 2017-02-03 DIAGNOSIS — Z66 Do not resuscitate: Secondary | ICD-10-CM | POA: Diagnosis present

## 2017-02-03 DIAGNOSIS — E1151 Type 2 diabetes mellitus with diabetic peripheral angiopathy without gangrene: Secondary | ICD-10-CM | POA: Diagnosis present

## 2017-02-03 DIAGNOSIS — I10 Essential (primary) hypertension: Secondary | ICD-10-CM | POA: Diagnosis not present

## 2017-02-03 DIAGNOSIS — Z7982 Long term (current) use of aspirin: Secondary | ICD-10-CM | POA: Diagnosis not present

## 2017-02-03 LAB — LIPID PANEL
Cholesterol: 285 mg/dL — ABNORMAL HIGH (ref 0–200)
HDL: 58 mg/dL (ref >40)
LDL Cholesterol: 177 mg/dL — ABNORMAL HIGH (ref 0–99)
TRIGLYCERIDES: 250 mg/dL — AB (ref <150)
Total CHOL/HDL Ratio: 4.9 RATIO
VLDL: 50 mg/dL — ABNORMAL HIGH (ref 0–40)

## 2017-02-03 LAB — VAS US CAROTID
LCCADDIAS: 10 cm/s
LEFT ECA DIAS: 12 cm/s
LEFT VERTEBRAL DIAS: -9 cm/s
LICAPDIAS: -16 cm/s
LICAPSYS: -70 cm/s
Left CCA dist sys: 61 cm/s
Left CCA prox dias: -13 cm/s
Left CCA prox sys: -94 cm/s
RIGHT ECA DIAS: -19 cm/s
RIGHT VERTEBRAL DIAS: 20 cm/s
Right cca dist sys: -67 cm/s

## 2017-02-03 LAB — CBC WITH DIFFERENTIAL/PLATELET
BASOS ABS: 0 10*3/uL (ref 0.0–0.1)
BASOS PCT: 0 %
EOS ABS: 0.1 10*3/uL (ref 0.0–0.7)
EOS PCT: 2 %
HCT: 39.3 % (ref 36.0–46.0)
Hemoglobin: 13.3 g/dL (ref 12.0–15.0)
Lymphocytes Relative: 25 %
Lymphs Abs: 1.7 10*3/uL (ref 0.7–4.0)
MCH: 31.4 pg (ref 26.0–34.0)
MCHC: 33.8 g/dL (ref 30.0–36.0)
MCV: 92.9 fL (ref 78.0–100.0)
MONO ABS: 0.6 10*3/uL (ref 0.1–1.0)
Monocytes Relative: 9 %
Neutro Abs: 4.4 10*3/uL (ref 1.7–7.7)
Neutrophils Relative %: 64 %
PLATELETS: 195 10*3/uL (ref 150–400)
RBC: 4.23 MIL/uL (ref 3.87–5.11)
RDW: 14 % (ref 11.5–15.5)
WBC: 6.9 10*3/uL (ref 4.0–10.5)

## 2017-02-03 LAB — BASIC METABOLIC PANEL
ANION GAP: 13 (ref 5–15)
BUN: 9 mg/dL (ref 6–20)
CALCIUM: 10.2 mg/dL (ref 8.9–10.3)
CO2: 26 mmol/L (ref 22–32)
CREATININE: 0.84 mg/dL (ref 0.44–1.00)
Chloride: 98 mmol/L — ABNORMAL LOW (ref 101–111)
GFR calc Af Amer: >60 mL/min (ref >60)
GFR, EST NON AFRICAN AMERICAN: 58 mL/min — AB (ref >60)
GLUCOSE: 153 mg/dL — AB (ref 65–99)
Potassium: 3.6 mmol/L (ref 3.5–5.1)
Sodium: 137 mmol/L (ref 135–145)

## 2017-02-03 LAB — ECHOCARDIOGRAM COMPLETE

## 2017-02-03 MED ORDER — PERFLUTREN LIPID MICROSPHERE
1.0000 mL | INTRAVENOUS | Status: AC | PRN
  Administered 2017-02-03: 2 mL via INTRAVENOUS
  Filled 2017-02-03: qty 10

## 2017-02-03 MED ORDER — ASPIRIN EC 325 MG PO TBEC
325.0000 mg | DELAYED_RELEASE_TABLET | Freq: Every day | ORAL | Status: DC
  Administered 2017-02-03 – 2017-02-06 (×4): 325 mg via ORAL
  Filled 2017-02-03 (×4): qty 1

## 2017-02-03 MED ORDER — ATORVASTATIN CALCIUM 40 MG PO TABS
40.0000 mg | ORAL_TABLET | Freq: Every day | ORAL | Status: DC
  Administered 2017-02-03 – 2017-02-06 (×4): 40 mg via ORAL
  Filled 2017-02-03 (×4): qty 1

## 2017-02-03 NOTE — Progress Notes (Signed)
PROGRESS NOTE    Katie Mcfarland  AVW:[id number] DOB: 09/03/1920 DOA: 02/02/2017 PCP: Londell MohPHARR,[name], MD    Brief Narrative:  83 y.o. female with medical history significant of COPD, dementia, HTN, Hyperlipidemia who presented to the ED by EMS with nre onset of left sided weakness if the left arm. Patient was seen normal last time around 2: 30 am and four hours later around 6:30 am staff found her to have significant weakness of the left arm and confusion   Assessment & Plan:   Principal Problem:   Acute ischemic stroke [facility](HCC) - Further evaluation recommendations by neurology. We'll follow-up Active Problems:    Dementia with behavioral disturbance - Stable   HLD (hyperlipidemia) - Stable continue statin   HTN (hypertension) - Allow for permissive hypertension given new diagnosis stroke. His systolic blood pressure 220 or above or diastolic blood pressure 110 or above then would plan on providing when necessary blood pressure medication  DVT prophylaxis: Heparin Code Status: dnr Family Communication: d/c power of attorney Disposition Plan: Pending improvement in condition   Consultants:   Neurology   Procedures: None   Antimicrobials: None   Subjective: Pt has no new complaints. No acute issues overnight.  Objective: Vitals:   02/03/17 0502 02/03/17 0911 02/03/17 1339 02/03/17 1658  BP: (!) 198/73 (!) 174/68 (!) 176/72 (!) 188/102  Pulse: 71 74 72 77  Resp: 18 18 20 18   Temp: 98.7 F (37.1 C) 98.3 F (36.8 C) 98.4 F (36.9 C) 98 F (36.7 C)  TempSrc: Oral Oral Oral Oral  SpO2: 95% 96% 96% 94%   No intake or output data in the 24 hours ending 02/03/17 1752 There were no vitals filed for this visit.  Examination:  General exam: Appears calm and comfortable, in nad. Respiratory system: Clear to auscultation. Respiratory effort normal. Cardiovascular system: S1 & S2 heard, RRR. No JVD, murmurs Gastrointestinal system: Abdomen is  nondistended, soft and nontender. No organomegaly or masses felt. Normal bowel sounds heard. Central nervous system: LUE weakness Extremities: no cyanosis Skin: No rashes, lesions or ulcers, on limited exam. Psychiatry: Mood & affect appropriate.     Data Reviewed: I have personally reviewed following labs and imaging studies  CBC:  Recent Labs Lab 02/02/17 0657 02/02/17 0703 02/02/17 1055 02/03/17 0311  WBC 8.7  --  9.5 6.9  NEUTROABS 6.1  --   --  4.4  HGB 13.9 14.3 13.3 13.3  HCT 41.0 42.0 39.6 39.3  MCV 92.8  --  92.5 92.9  PLT 214  --  213 195   Basic Metabolic Panel:  Recent Labs Lab 02/02/17 0657 02/02/17 0703 02/02/17 1055 02/03/17 0311  NA 136 136  --  137  K 3.9 3.8  --  3.6  CL 99* 99*  --  98*  CO2 28  --   --  26  GLUCOSE 179* 182*  --  153*  BUN 11 13  --  9  CREATININE 0.83 0.70 0.72 0.84  CALCIUM 10.2  --   --  10.2   GFR: CrCl cannot be calculated (Unknown ideal weight.). Liver Function Tests:  Recent Labs Lab 02/02/17 0657  AST 55*  ALT 72*  ALKPHOS 152*  BILITOT 0.7  PROT 7.5  ALBUMIN 3.6   No results for input(s): LIPASE, AMYLASE in the last 168 hours. No results for input(s): AMMONIA in the last 168 hours. Coagulation Profile:  Recent Labs Lab 02/02/17 0657  INR 0.94   Cardiac Enzymes: No results for  input(s): CKTOTAL, CKMB, CKMBINDEX, TROPONINI in the last 168 hours. BNP (last 3 results) No results for input(s): PROBNP in the last 8760 hours. HbA1C: No results for input(s): HGBA1C in the last 72 hours. CBG:  Recent Labs Lab 02/02/17 0804  GLUCAP 165*   Lipid Profile:  Recent Labs  02/03/17 0311  CHOL 285*  HDL 58  LDLCALC 177*  TRIG 250*  CHOLHDL 4.9   Thyroid Function Tests: No results for input(s): TSH, T4TOTAL, FREET4, T3FREE, THYROIDAB in the last 72 hours. Anemia Panel: No results for input(s): VITAMINB12, FOLATE, FERRITIN, TIBC, IRON, RETICCTPCT in the last 72 hours. Sepsis Labs:  Recent  Labs Lab 02/02/17 1055  PROCALCITON 0.11    Recent Results (from the past 240 hour(s))  Culture, blood (routine x 2) Call MD if unable to obtain prior to antibiotics being given     Status: None (Preliminary result)   Collection Time: 02/02/17 11:23 AM  Result Value Ref Range Status   Specimen Description BLOOD RIGHT HAND  Final   Special Requests BOTTLES DRAWN AEROBIC AND ANAEROBIC 5CC  Final   Culture NO GROWTH 1 DAY  Final   Report Status PENDING  Incomplete  Culture, blood (routine x 2) Call MD if unable to obtain prior to antibiotics being given     Status: None (Preliminary result)   Collection Time: 02/02/17 11:24 AM  Result Value Ref Range Status   Specimen Description BLOOD RIGHT WRIST  Final   Special Requests BOTTLES DRAWN AEROBIC AND ANAEROBIC 5CC  Final   Culture NO GROWTH 1 DAY  Final   Report Status PENDING  Incomplete     Radiology Studies: Dg Chest 2 View  Result Date: 02/02/2017 CLINICAL DATA:  Left-sided weakness, possible CVA, nonsmoker. History of dementia and encephalopathy and COPD. EXAM: CHEST  2 VIEW COMPARISON:  Portable chest x-ray of September 18, 2015 FINDINGS: The lungs are reasonably well inflated. There is mildly increased density in the left lower lobe. There is no pleural effusion or pneumothorax. The cardiac silhouette is enlarged. The pulmonary vascularity is not engorged. There is calcification in the wall of the thoracic aorta. The bony thorax exhibits no acute abnormality. There is multilevel degenerative disc disease of the thoracic spine. IMPRESSION: Cardiomegaly without pulmonary edema. Left lower lobe subsegmental atelectasis or early pneumonia. Thoracic aortic atherosclerosis. Electronically Signed   By: David  [place] M.D.   On: 02/02/2017 07:57   Mr Angiogram Head Wo Contrast  Result Date: 02/02/2017 CLINICAL DATA:  Dementia. Acute presentation with left-sided weakness. EXAM: MRI HEAD WITHOUT CONTRAST MRA HEAD WITHOUT CONTRAST TECHNIQUE:  Multiplanar, multiecho pulse sequences of the brain and surrounding structures were obtained without intravenous contrast. Angiographic images of the head were obtained using MRA technique without contrast. COMPARISON:  Head CT same day and 09/18/2015 FINDINGS: MRI HEAD FINDINGS Brain: Diffusion imaging shows a 1 cm acute infarction in the deep white matter adjacent to the posterior body of the right lateral ventricle. No other acute infarction. No evidence of large vessel territory infarction. Brainstem is normal. There are old small vessel cerebellar infarctions bilaterally. Cerebral hemispheres show old infarction in the right external capsule and old cortical and subcortical infarction in the right posterior frontal region. There are moderate chronic small-vessel ischemic changes throughout the white matter. Ventricles are prominent, consistent with central atrophy. Question slight increase in ventricular prominence since 2016, raising at least the question of normal pressure hydrocephalus. Vascular: Major vessels at the base of the brain show flow. Skull and upper  cervical spine: Negative Sinuses/Orbits: Clear/normal Other: None significant MRA HEAD FINDINGS Both internal carotid arteries are patent into the brain. No siphon stenosis. The anterior and middle cerebral vessels are patent without proximal stenosis, aneurysm or vascular malformation. Both vertebral arteries are patent to the basilar. No basilar stenosis. Posterior circulation branch vessels are normal proximally. More distal intracranial vessels show some atherosclerotic irregularity diffusely. IMPRESSION: 1 cm acute infarction in the right hemispheric deep white matter adjacent to the posterior body of the right lateral ventricle. Old small vessel infarctions and chronic microangiopathic changes. Negative intracranial MR angiography of the large and medium size vessels. Ventricular prominence, probably largely due to central atrophy. Question if  this is slightly progressive since 2016. Is there any clinical sign of normal pressure hydrocephalus? Electronically Signed   By: Paulina Fusi M.D.   On: 02/02/2017 10:01   Mr Brain Wo Contrast  Result Date: 02/02/2017 CLINICAL DATA:  Dementia. Acute presentation with left-sided weakness. EXAM: MRI HEAD WITHOUT CONTRAST MRA HEAD WITHOUT CONTRAST TECHNIQUE: Multiplanar, multiecho pulse sequences of the brain and surrounding structures were obtained without intravenous contrast. Angiographic images of the head were obtained using MRA technique without contrast. COMPARISON:  Head CT same day and 09/18/2015 FINDINGS: MRI HEAD FINDINGS Brain: Diffusion imaging shows a 1 cm acute infarction in the deep white matter adjacent to the posterior body of the right lateral ventricle. No other acute infarction. No evidence of large vessel territory infarction. Brainstem is normal. There are old small vessel cerebellar infarctions bilaterally. Cerebral hemispheres show old infarction in the right external capsule and old cortical and subcortical infarction in the right posterior frontal region. There are moderate chronic small-vessel ischemic changes throughout the white matter. Ventricles are prominent, consistent with central atrophy. Question slight increase in ventricular prominence since 2016, raising at least the question of normal pressure hydrocephalus. Vascular: Major vessels at the base of the brain show flow. Skull and upper cervical spine: Negative Sinuses/Orbits: Clear/normal Other: None significant MRA HEAD FINDINGS Both internal carotid arteries are patent into the brain. No siphon stenosis. The anterior and middle cerebral vessels are patent without proximal stenosis, aneurysm or vascular malformation. Both vertebral arteries are patent to the basilar. No basilar stenosis. Posterior circulation branch vessels are normal proximally. More distal intracranial vessels show some atherosclerotic irregularity  diffusely. IMPRESSION: 1 cm acute infarction in the right hemispheric deep white matter adjacent to the posterior body of the right lateral ventricle. Old small vessel infarctions and chronic microangiopathic changes. Negative intracranial MR angiography of the large and medium size vessels. Ventricular prominence, probably largely due to central atrophy. Question if this is slightly progressive since 2016. Is there any clinical sign of normal pressure hydrocephalus? Electronically Signed   By: Paulina Fusi M.D.   On: 02/02/2017 10:01   Ct Head Code Stroke W/o Cm  Result Date: 02/02/2017 CLINICAL DATA:  Code stroke. LEFT-sided weakness. Last seen normal 02:30 a.m. EXAM: CT HEAD WITHOUT CONTRAST TECHNIQUE: Contiguous axial images were obtained from the base of the skull through the vertex without intravenous contrast. COMPARISON:  09/18/2015. FINDINGS: Brain: Generalized atrophy with chronic microvascular ischemic change. Slight asymmetric hypoattenuation of the RIGHT insula versus LEFT, loss of insular ribbon, could represent early infarction. No hemorrhage, mass lesion, or extra-axial fluid. Hydrocephalus ex vacuo. Moderate-sized remote RIGHT frontal cortical infarct. BILATERAL cerebellar infarcts, of a chronic nature. Vascular: There is no asymmetry of attenuation of the M1 MCA segments. Possible asymmetric hyperattenuation of a MCA branch in the sylvian fissure, so-called "  dot sign", see image 14 series 201. Skull: Normal. Negative for fracture or focal lesion. Sinuses/Orbits: No acute finding. Other: None. Compared with prior CT from 09/18/2015, the degree of atrophy is similar. RIGHT frontal infarction has progressed since that time. BILATERAL cerebellar infarcts are similar. ASPECTS [facility] Stroke Program Early CT Score) - Ganglionic level infarction (caudate, lentiform nuclei, internal capsule, insula, M1-M3 cortex): 6(insula). - Supraganglionic infarction (M4-M6 cortex): 3 Total score (0-10 with 10 being  normal): 9 IMPRESSION: 1. Atrophy and small vessel disease with evidence of remote supratentorial and infratentorial ischemia. Subtle asymmetry of hypoattenuation, RIGHT insula versus LEFT could represent early infarction. Possible hyperdense RIGHT MCA branch, probably M3, in the sylvian fissure. 2. ASPECTS is 9. These results were called by telephone at the time of interpretation on 02/02/2017 at 7:17 am to Dr. Otelia Limes , who verbally acknowledged these results. Electronically Signed   By: Elsie Stain M.D.   On: 02/02/2017 07:22    Scheduled Meds: .  stroke: mapping our early stages of recovery book   Does not apply Once  . aspirin EC  325 mg Oral Daily  . atorvastatin  40 mg Oral q1800  . heparin  5,000 Units Subcutaneous Q8H  . latanoprost  1 drop Both Eyes QHS  . sodium chloride flush  3 mL Intravenous Q12H   Continuous Infusions:   LOS: 0 days    Time spent: > 35 minutes   Penny Pia, MD [facility] Pager [phone]  If 7PM-7AM, please contact night-coverage [email or web address] Password [facility] 02/03/2017, 5:52 PM

## 2017-02-03 NOTE — Progress Notes (Signed)
  Echocardiogram 2D Echocardiogram has been performed.  Katie SavoyCasey N Kafi Mcfarland 02/03/2017, 3:18 PM

## 2017-02-03 NOTE — Progress Notes (Signed)
SLP Note (late entry):    02/02/17 1600  SLP G-Codes **NOT FOR INPATIENT CLASS**  Functional Assessment Tool Used skilled clinical judgment  Functional Limitations Swallowing  Swallow Current Status (Z6109(G8996) CJ  Swallow Goal Status (U0454(G8997) CI  SLP Evaluations  $ SLP Speech Visit 1 Procedure  SLP Evaluations  $BSS Swallow 1 Procedure   Maxcine HamLaura Paiewonsky, M.A. CCC-SLP [phone]

## 2017-02-03 NOTE — Evaluation (Signed)
Speech Language Pathology Evaluation Patient Details Name: Katie RichesMildred Schillinger Mcfarland MRN: [id number] DOB: [birth year] Today's Date: 02/03/2017 Time: 4098-11911150-1221 SLP Time Calculation (min) (ACUTE ONLY): 31 min  Problem List:  Patient Active Problem List   Diagnosis Date Noted  . Acute ischemic stroke (HCC) 02/02/2017  . HLD (hyperlipidemia) 09/19/2015  . HTN (hypertension) 09/19/2015  . Acute encephalopathy 09/19/2015  . Pneumonia 09/18/2015  . Dementia with behavioral disturbance 09/18/2015   Past Medical History:  Past Medical History:  Diagnosis Date  . COPD (chronic obstructive pulmonary disease) (HCC)   . Dementia   . High cholesterol   . Hypertension    Past Surgical History:  Past Surgical History:  Procedure Laterality Date  . CHOLECYSTECTOMY     HPI:  Pt is a 83 y.o. female with PMH significant for COPD, dementia, HTN. Presented to ED by EMS on 02/02/17 with new onset of L-side arm weakness. MRI shows acute infarction in the R hemispheric deep white matter adjacent to posterior body of R lateral ventricle. CXR shows L lower lobe subsegmental atelectasis or early pneumonia. Prior BSE 09/2015 recommended Dys 3 diet and thin liquids with use of small, single sips. Daughter reports intermittent regurgitation of meds in the morning.   Assessment / Plan / Recommendation Clinical Impression  Pt presents with cognitive impairments, particularly in the areas of memory and awareness, although her daughter reports that she is at her cognitive-linguistic baseline. She presents from memory care unit and would be appropriate to return to that level of care upon d/c. SLP will continue to follow for dysphagia only.    SLP Assessment  SLP Recommendation/Assessment: Patient does not need any further Speech Lanaguage Pathology Services SLP Visit Diagnosis: Cognitive communication deficit (R41.841)    Follow Up Recommendations  None    Frequency and Duration           SLP  Evaluation Cognition  Overall Cognitive Status: History of cognitive impairments - at baseline       Comprehension  Auditory Comprehension Overall Auditory Comprehension: Appears within functional limits for tasks assessed    Expression Expression Primary Mode of Expression: Verbal Verbal Expression Overall Verbal Expression: Appears within functional limits for tasks assessed Written Expression Dominant Hand: Right   Oral / Motor  Motor Speech Overall Motor Speech: Appears within functional limits for tasks assessed   GO                    Maxcine Hamaiewonsky, Mamoru Takeshita 02/03/2017, 3:29 PM  Maxcine HamLaura Paiewonsky, M.A. CCC-SLP [phone]

## 2017-02-03 NOTE — Progress Notes (Signed)
*  PRELIMINARY RESULTS* Vascular Ultrasound Carotid Duplex (Doppler) has been completed.  Preliminary findings: Bilateral 1-39% ICA stenosis,tortuous vessels bilaterally, difficult exam.   Tempie Donningharlotte C Zissy Hamlett 02/03/2017, 3:30 PM

## 2017-02-03 NOTE — Progress Notes (Signed)
  Speech Language Pathology Treatment: Dysphagia  Patient Details Name: Katie Mcfarland MRN: [id number] DOB: 01-29-1920 Today's Date: 02/03/2017 Time: [id number] SLP Time Calculation (min) (ACUTE ONLY): 16 min  Assessment / Plan / Recommendation Clinical Impression  Pt's daughter reported coughing that started ~10 minutes into each meal. During lunch today, her oropharyngeal swallow appears intact through the first half of her sandwich, and then she starts to experience coughing. It seems to be exacerbated by straw use, but cough is not limited to thin liquid consumption. Her voice still remains clear. Given that she has seemingly swift swallow function and onset of coughing is delayed across the meal, continue to suspect possible esophageal component contributing. Pt/family would like to proceed with MBS to better assess airway protection. Until that time, would continue with current softer diet but with NO straws. Continue to use aspiration and esophageal precautions and take a break from POs once coughing has started.   HPI HPI: Pt is a 83 y.o. female with PMH significant for COPD, dementia, HTN. Presented to ED by EMS on 02/02/17 with new onset of L-side arm weakness. MRI shows acute infarction in the R hemispheric deep white matter adjacent to posterior body of R lateral ventricle. CXR shows L lower lobe subsegmental atelectasis or early pneumonia. Prior BSE 09/2015 recommended Dys 3 diet and thin liquids with use of small, single sips. Daughter reports intermittent regurgitation of meds in the morning.      SLP Plan  MBS       Recommendations  Diet recommendations: Dysphagia 3 (mechanical soft);Thin liquid Liquids provided via: Cup;No straw Medication Administration: Whole meds with puree Supervision: Staff to assist with self feeding;Full supervision/cueing for compensatory strategies Compensations: Slow rate;Small sips/bites;Follow solids with liquid Postural Changes and/or  Swallow Maneuvers: Seated upright 90 degrees;Upright 30-60 min after meal                Oral Care Recommendations: Oral care BID Follow up Recommendations: Skilled Nursing facility SLP Visit Diagnosis: Dysphagia, unspecified (R13.10) Plan: MBS       GO                Katie Mcfarland, Katie Mcfarland 02/03/2017, 1:23 PM  Katie Mcfarland, M.A. CCC-SLP [phone]

## 2017-02-03 NOTE — NC FL2 (Signed)
[place] MEDICAID FL2 LEVEL OF CARE SCREENING TOOL     IDENTIFICATION  Patient Name: Katie Mcfarland Birthdate: Apr 29, 1920 Sex: female Admission Date (Current Location): 02/02/2017  [facility] and [place] Number:  Producer, television/film/video and Address:  [facility] [place], [address] [place], [place] [address]      Provider Number: [id number]  Attending Physician Name and Address:  Penny Pia, MD  Relative Name and Phone Number:       Current Level of Care: Hospital Recommended Level of Care: Skilled Nursing Facility Prior Approval Number:    Date Approved/Denied:   PASRR Number:    Discharge Plan: SNF    Current Diagnoses: Patient Active Problem List   Diagnosis Date Noted  . Acute ischemic stroke (HCC) 02/02/2017  . HLD (hyperlipidemia) 09/19/2015  . HTN (hypertension) 09/19/2015  . Acute encephalopathy 09/19/2015  . Pneumonia 09/18/2015  . Dementia with behavioral disturbance 09/18/2015    Orientation RESPIRATION BLADDER Height & Weight     Self, Time, Situation, Place  Normal Incontinent Weight:   Height:     BEHAVIORAL SYMPTOMS/MOOD NEUROLOGICAL BOWEL NUTRITION STATUS      Incontinent  (Please see discharge summary)  AMBULATORY STATUS COMMUNICATION OF NEEDS Skin   Extensive Assist Verbally Normal                       Personal Care Assistance Level of Assistance  Bathing, Feeding, Dressing Bathing Assistance: Maximum assistance Feeding assistance: Limited assistance Dressing Assistance: Maximum assistance     Functional Limitations Info  Sight, Hearing, Speech Sight Info: Adequate Hearing Info: Adequate Speech Info: Adequate    SPECIAL CARE FACTORS FREQUENCY  PT (By licensed PT), OT (By licensed OT)     PT Frequency: min 3x week OT Frequency: min 3x week            Contractures Contractures Info: Not present    Additional Factors Info  Code Status, Allergies Code Status Info: DNR Allergies Info: No  known allergies           Current Medications (02/03/2017):  This is the current hospital active medication list Current Facility-Administered Medications  Medication Dose Route Frequency Provider Last Rate Last Dose  .  stroke: mapping our early stages of recovery book   Does not apply Once Marina S Kyazimova, PA-C      . aspirin EC tablet 325 mg  325 mg Oral Daily Marvel Plan, MD   325 mg at 02/03/17 1131  . heparin injection 5,000 Units  5,000 Units Subcutaneous [address] [place], PA-C   5,000 Units at 02/03/17 0515  . ipratropium-albuterol (DUONEB) 0.5-2.5 (3) MG/3ML nebulizer solution 3 mL  3 mL Nebulization Q4H PRN Isaiah Blakes, PA-C      . latanoprost (XALATAN) 0.005 % ophthalmic solution 1 drop  1 drop Both Eyes QHS Isaiah Blakes, PA-C   1 drop at 02/02/17 2101  . ondansetron (ZOFRAN) tablet 4 mg  4 mg Oral Q6H PRN Isaiah Blakes, PA-C       Or  . ondansetron [facility]) injection 4 mg  4 mg Intravenous Q6H PRN Isaiah Blakes, PA-C      . perflutren lipid microspheres (DEFINITY) IV suspension  1-10 mL Intravenous PRN Isaiah Blakes, PA-C   2 mL at 02/03/17 1455  . polyethylene glycol (MIRALAX / GLYCOLAX) packet 17 g  17 g Oral Daily PRN Isaiah Blakes, PA-C      .  sodium chloride flush (NS) 0.9 % injection 3 mL  3 mL Intravenous Q12H Isaiah BlakesMarina S Kyazimova, PA-C   3 mL at 02/02/17 2101  . traMADol (ULTRAM) tablet 50 mg  50 mg Oral Q6H PRN Isaiah BlakesMarina S Kyazimova, PA-C      . traZODone (DESYREL) tablet 50 mg  50 mg Oral QHS PRN Isaiah BlakesMarina S Kyazimova, PA-C         Discharge Medications: Please see discharge summary for a list of discharge medications.  Relevant Imaging Results:  Relevant Lab Results:   Additional Information SSN: [id number]  Volney AmericanBridget A Mayton, LCSW

## 2017-02-03 NOTE — Clinical Social Work Note (Signed)
Clinical Social Work Assessment  Patient Details  Name: Katie Mcfarland MRN: [id number] Date of Birth: October 14, 1920  Date of referral:  02/03/17               Reason for consult:  Facility Placement                Permission sought to share information with:    Permission granted to share information::     Name::     Bonita QuinLinda  Agency::     Relationship::  Daughter  Contact Information:     Housing/Transportation Living arrangements for the past 2 months:  Assisted Living Facility (PT recommending HH at ALF pt is from, however facility will assess pt to determine if they can accomidate pt's physical limitations. May need SNF) Source of Information:  Adult Children Patient Interpreter Needed:  None Criminal Activity/Legal Involvement Pertinent to Current Situation/Hospitalization:  No - Comment as needed Significant Relationships:  Adult Children Lives with:  Facility Resident Do you feel safe going back to the place where you live?    Need for family participation in patient care:     Care giving concerns:  OT notified that pt's daughter is pt's HCPOA. Pt's daughter called CSW to discuss plan.   Social Worker assessment / plan:  CSW spoke with pt's daughter. Pt has been at [facility]. PT has recommended HH at the ALF. However, the ALF will be in tomorrow AM to assess pt to determine if they can accommodate her physical limitations. Pt is a +2 assist at the time. Pt's daughter understands the need for SNF backup and is agreeable at this time. Pt's daughter ask that CSW send pt's referral to [facility] as this is where she wants her mom to go. CSW will follow up with pt's daughter in the AM.  Employment status:  Retired Database administratornsurance information:  Managed Medicare PT Recommendations:  Skilled Nursing Facility (PT recommending HH at ALF pt is from, however facility will assess pt to determine if they can accomidate pt's physical limitations. May need SNF) Information / Referral to  community resources:  Skilled Nursing Facility  Patient/Family's Response to care:  Pt's daughter verbalized understanding of CSW role and expressed appreciation for support. Pt's daughter is not happy with pt care as she states she has not seen a MD since the pt has been here. Pt's daughter also states she hasn't heard any response from the MRI her mom had in the ER.   Patient/Family's Understanding of and Emotional Response to Diagnosis, Current Treatment, and Prognosis:  Pt's daughter understanding and realistic regarding physical limitations. Pt's daughter understands the need for SNF placement at d/c. Pt's daughter agreeable to SNF placement at d/c, at this time.Pt's daughter denies any concern regarding treatment plan at this time. CSW will continue to provide support and facilitate d/c needs.   Emotional Assessment Appearance:  Appears stated age Attitude/Demeanor/Rapport:    Affect (typically observed):    Orientation:  Oriented to Self, Oriented to Place, Oriented to  Time, Oriented to Situation Alcohol / Substance use:  Not Applicable Psych involvement (Current and /or in the community):  No (Comment)  Discharge Needs  Concerns to be addressed:  No discharge needs identified Readmission within the last 30 days:  No Current discharge risk:  Dependent with Mobility Barriers to Discharge:  Continued Medical Work up   Safeway IncBridget A Mayton, LCSW 02/03/2017, 3:59 PM

## 2017-02-03 NOTE — Progress Notes (Signed)
OT Cancellation Note  Patient Details Name: Katie RichesMildred Schillinger Mcfarland MRN: [id number] DOB: 05/03/1920   Cancelled Treatment:    Reason Eval/Treat Not Completed: Patient at procedure or test/ unavailable. Will re-attempt eval at a later time.  Evette GeorgesLeonard, Lennie Vasco Eva [phone] 02/03/2017, 10:14 AM

## 2017-02-03 NOTE — Evaluation (Signed)
Occupational Therapy Evaluation Patient Details Name: Imagene RichesMildred Schillinger Kaas MRN: [id number] DOB: February 09, 1920 Today's Date: 02/03/2017    History of Present Illness Pt is a 83 y.o. female with PMH significant for COPD, dementia, HTN. Presented to ED by EMS on 02/02/17 with new onset of L-side arm weakness. MRI shows acute infarction in the R hemispheric deep white matter adjacent to posterior body of R lateral ventricle. CXR shows L lower lobe subsegmental atelectasis or early pneumonia.    Clinical Impression   This 83 yo female admitted with above presents to acute OT with deficits below (see OT progress note) thus affecting her PLOF. She will benefit from acute OT with follow up at SNF (if current facility cannot manage her from the need of a lift standpoint).     Follow Up Recommendations  SNF;Other (comment) (if pt cannot be managed at current facility--feel she will need a lift to safely be taken care of at memory care unit/ALF and they are a "lift free facility")    Equipment Recommendations  None recommended by OT       Precautions / Restrictions Precautions Precautions: Fall Restrictions Weight Bearing Restrictions: No      Mobility Bed Mobility Overal bed mobility: Needs Assistance Bed Mobility: Rolling;Sit to Supine Rolling: Max assist (to right) Sidelying to sit: Max assist          Transfers Overall transfer level: Needs assistance   Transfers: Sit to/from [employer]Stand;Squat Pivot Transfers           General transfer comment: Pt unable to stand fully upright with +1 A--performed squat pivot transfer with use of belt and pads with +2 max A going to pt's right    Balance Overall balance assessment: Needs assistance Sitting-balance support: Bilateral upper extremity supported;Feet supported Sitting balance-Leahy Scale: Poor Sitting balance - Comments: Pt able to maintain sitting balance with support for brief periods, with decreased trunk control and posterior  lean.  Postural control: Posterior lean Standing balance support: Bilateral upper extremity supported Standing balance-Leahy Scale: Zero                              ADL Overall ADL's : Needs assistance/impaired Eating/Feeding: Minimal assistance;Bed level   Grooming: Maximal assistance (supported sitting in recliner)   Upper Body Bathing: Moderate assistance (supported sitting in recliner)   Lower Body Bathing: Total assistance;Bed level   Upper Body Dressing : Maximal assistance (sitting EOB--A 1/2 due to balance)   Lower Body Dressing: Total assistance;Bed level   Toilet Transfer: +2 for physical assistance;Maximal assistance;Squat-pivot Toilet Transfer Details (indicate cue type and reason): bed>recliner on her right Toileting- Clothing Manipulation and Hygiene: Total assistance;Bed level               Vision Patient Visual Report: No change from baseline Vision Assessment?: Yes Eye Alignment: Within Functional Limits Ocular Range of Motion: Within Functional Limits Alignment/Gaze Preference: Within Defined Limits Tracking/Visual Pursuits: Able to track stimulus in all quads without difficulty Visual Fields: No apparent deficits            Pertinent Vitals/Pain Pain Assessment: No/denies pain     Hand Dominance Right   Extremity/Trunk Assessment Upper Extremity Assessment Upper Extremity Assessment: LUE deficits/detail LUE Deficits / Details: Decreased AROM; pt shoulder flexion can get to ~90 degrees with weight of arm supported; elbow flexion/extension (cannot control well), supination/pronation--can do but not full supination; wrist extension minimal, can flex digits but not extend LUE  Coordination: decreased fine motor;decreased gross motor           Communication Communication Communication: No difficulties   Cognition Arousal/Alertness: Awake/alert Behavior During Therapy: WFL for tasks assessed/performed Overall Cognitive Status:  Impaired/Different from baseline Area of Impairment: Following commands;Awareness;Problem solving       Following Commands: Follows one step commands consistently Safety/Judgement: Decreased awareness of safety;Decreased awareness of deficits   Problem Solving: Difficulty sequencing;Requires verbal cues;Requires tactile cues        Exercises   Other Exercises Other Exercises: educated pt and family on 2 exercises she needs to do while in recliner with her LUE while it is on a pillow (internal/external rotation of shoulder and forearm supination/pronation)        Home Living Family/patient expects to be discharged to:: Skilled nursing facility                                 Additional Comments: [facility]      Prior Functioning/Environment Level of Independence: Needs assistance  Gait / Transfers Assistance Needed: W/c for all mobility, requiring assist of +1 to squat pivot into lift chair. Pt unable to self-propel. ADL's / Homemaking Assistance Needed: Requires assist for all ADLs/IADLs, including bathing and pericare.--can stand and pivot with +1 A and use of bars            OT Problem List: Decreased strength;Decreased range of motion;Impaired balance (sitting and/or standing);Impaired UE functional use;Obesity;Decreased cognition;Impaired tone;Decreased safety awareness      OT Treatment/Interventions: Self-care/ADL training;Therapeutic activities;Therapeutic exercise;Neuromuscular education;Patient/family education;DME and/or AE instruction;Balance training    OT Goals(Current goals can be found in the care plan section) Acute Rehab OT Goals Patient Stated Goal: to get out of her soon OT Goal Formulation: With patient/family Time For Goal Achievement: 02/17/17 Potential to Achieve Goals: Good  OT Frequency: Min 2X/week              End of Session Equipment Utilized During Treatment: Gait belt Nurse Communication: Mobility  status;Need for lift equipment (maxi move; wrote this on board in room as well)  Activity Tolerance: Patient tolerated treatment well Patient left: in chair;with call bell/phone within reach;with chair alarm set  OT Visit Diagnosis: Hemiplegia and hemiparesis Hemiplegia - Right/Left: Left Hemiplegia - dominant/non-dominant: Non-Dominant Hemiplegia - caused by: Cerebral infarction                ADL either performed or assessed with clinical judgement  Time: 1057-1140 OT Time Calculation (min): 43 min Charges:  OT General Charges $OT Visit: 1 Procedure OT Evaluation $OT Eval Moderate Complexity: 1 Procedure OT Treatments $Self Care/Home Management : 23-37 mins G-Codes: OT G-codes **NOT FOR INPATIENT CLASS** Functional Assessment Tool Used: AM-PAC 6 Clicks Daily Activity Functional Limitation: Self care Self Care Current Status (U9811): At least 60 percent but less than 80 percent impaired, limited or restricted Self Care Goal Status (B1478): At least 40 percent but less than 60 percent impaired, limited or restricted   Ignacia Palma, OTR/L [phone] 02/03/2017

## 2017-02-04 ENCOUNTER — Inpatient Hospital Stay (HOSPITAL_COMMUNITY): Payer: Medicare Other

## 2017-02-04 LAB — HEMOGLOBIN A1C
HEMOGLOBIN A1C: 7.7 % — AB (ref 4.8–5.6)
Mean Plasma Glucose: 174 mg/dL

## 2017-02-04 LAB — PROCALCITONIN: Procalcitonin: 0.17 ng/mL

## 2017-02-04 MED ORDER — HYDRALAZINE HCL 20 MG/ML IJ SOLN: 5.0000 mg | Freq: Four times a day (QID) | INTRAMUSCULAR | Status: DC | PRN

## 2017-02-04 MED ORDER — HYDRALAZINE HCL 20 MG/ML IJ SOLN
10.0000 mg | Freq: Once | INTRAMUSCULAR | Status: AC
  Administered 2017-02-04: 10 mg via INTRAVENOUS
  Filled 2017-02-04: qty 1

## 2017-02-04 MED ORDER — BENZONATATE 100 MG PO CAPS
100.0000 mg | ORAL_CAPSULE | Freq: Three times a day (TID) | ORAL | Status: DC | PRN
  Administered 2017-02-05: 100 mg via ORAL
  Filled 2017-02-04: qty 1

## 2017-02-04 NOTE — Progress Notes (Signed)
Modified Barium Swallow Progress Note  Patient Details  Name: Imagene RichesMildred Schillinger Gebhardt MRN: [id number] Date of Birth: 07/27/1920  Today's Date: 02/04/2017  Modified Barium Swallow completed.  Full report located under Chart Review in the Imaging Section.  Brief recommendations include the following:  Clinical Impression  Patient presents with oropharyngeal swallowing function which is observed to be within functional limits with adequate airway protection. No aspiration, penetration observed despite challenging with multiple sips via straw and cup. Swallow initiation timely at the level of the valleculae, epiglottic deflection complete. No abnormal residue remained in the oral cavity or pharynx after the swallow. During testing, patient with intermittent throat clear which was not attributable to decreased airway protection. This examination was completed immediately following patient's lunch meal. An esophageal sweep was performed with barium tablet in puree which was unremarkable, though view was limited due to patient position. Noted multiple eructations following the examination. Recommend regular diet with thin liquids, follow-up with GI should complaints of regurgitation, coughing during and after meals persist. SLP will follow up for additional treatment session for patient/ family education.    Swallow Evaluation Recommendations       SLP Diet Recommendations: Regular solids;Thin liquid   Liquid Administration via: Cup;Straw   Medication Administration: Whole meds with puree   Supervision: Patient able to self feed   Compensations: Slow rate;Small sips/bites;Follow solids with liquid   Postural Changes: Remain semi-upright after after feeds/meals (Comment);Seated upright at 90 degrees   Oral Care Recommendations: Oral care BID     Rondel BatonMary Beth Elany Felix, [place] CF-SLP Speech-Language Pathologist [phone]   Arlana LindauMary E Eliyas Suddreth 02/04/2017,1:26 PM

## 2017-02-04 NOTE — Progress Notes (Signed)
Order received for 10 mg IV hydralazine .Medication given .Will continue to monitor.

## 2017-02-04 NOTE — Progress Notes (Signed)
PROGRESS NOTE    Katie Mcfarland  UEA:[id number] DOB: Aug 23, 1920 DOA: 02/02/2017 PCP: Londell Moh, MD    Brief Narrative:  83 y.o. female with medical history significant of COPD, dementia, HTN, Hyperlipidemia who presented to the ED by EMS with nre onset of left sided weakness if the left arm. Patient was seen normal last time around 2: 30 am and four hours later around 6:30 am staff found her to have significant weakness of the left arm and confusion   Assessment & Plan:   Principal Problem:   Acute ischemic stroke [facility]) - Neurology has evaluated. Plan is for SNF placement. Social worker assisting  Active Problems:    Dementia with behavioral disturbance - Stable   HLD (hyperlipidemia) - Stable continue statin   HTN (hypertension) - Allow for permissive hypertension given new diagnosis stroke. Should systolic blood pressure 220 or above or diastolic blood pressure 110 or above then would plan on providing when necessary blood pressure medication. May do Hydralazine 5 mg q 6 hours prn  DVT prophylaxis: Heparin Code Status: dnr Family Communication: d/c power of attorney Disposition Plan: SNF placement. Most likely Monday as patient is unable to return back to her facility due to new deficits.  Consultants:   Neurology   Procedures: None   Antimicrobials: None   Subjective: POA had many concerns. Pt has no new complaints otherwise.  Objective: Vitals:   02/04/17 0524 02/04/17 0525 02/04/17 0640 02/04/17 1314  BP: (!) 225/73 (!) 221/77 (!) 164/51   Pulse: 69 71    Resp:      Temp:      TempSrc:    Other (Comment)  SpO2:        Intake/Output Summary (Last 24 hours) at 02/04/17 1612 Last data filed at 02/03/17 2237  Gross per 24 hour  Intake              240 ml  Output                0 ml  Net              240 ml   There were no vitals filed for this visit.  Examination:  General exam: Appears calm and comfortable, in  nad. Respiratory system: Clear to auscultation. Respiratory effort normal. Cardiovascular system: S1 & S2 heard, RRR. No JVD, murmurs Gastrointestinal system: Abdomen is nondistended, soft and nontender. No organomegaly or masses felt. Normal bowel sounds heard. Central nervous system: LUE weakness Extremities: no cyanosis Skin: No rashes, lesions or ulcers, on limited exam. Psychiatry: Mood & affect appropriate.     Data Reviewed: I have personally reviewed following labs and imaging studies  CBC:  Recent Labs Lab 02/02/17 0657 02/02/17 0703 02/02/17 1055 02/03/17 0311  WBC 8.7  --  9.5 6.9  NEUTROABS 6.1  --   --  4.4  HGB 13.9 14.3 13.3 13.3  HCT 41.0 42.0 39.6 39.3  MCV 92.8  --  92.5 92.9  PLT 214  --  213 195   Basic Metabolic Panel:  Recent Labs Lab 02/02/17 0657 02/02/17 0703 02/02/17 1055 02/03/17 0311  NA 136 136  --  137  K 3.9 3.8  --  3.6  CL 99* 99*  --  98*  CO2 28  --   --  26  GLUCOSE 179* 182*  --  153*  BUN 11 13  --  9  CREATININE 0.83 0.70 0.72 0.84  CALCIUM 10.2  --   --  10.2   GFR: CrCl cannot be calculated (Unknown ideal weight.). Liver Function Tests:  Recent Labs Lab 02/02/17 0657  AST 55*  ALT 72*  ALKPHOS 152*  BILITOT 0.7  PROT 7.5  ALBUMIN 3.6   No results for input(s): LIPASE, AMYLASE in the last 168 hours. No results for input(s): AMMONIA in the last 168 hours. Coagulation Profile:  Recent Labs Lab 02/02/17 0657  INR 0.94   Cardiac Enzymes: No results for input(s): CKTOTAL, CKMB, CKMBINDEX, TROPONINI in the last 168 hours. BNP (last 3 results) No results for input(s): PROBNP in the last 8760 hours. HbA1C:  Recent Labs  02/03/17 0311  HGBA1C 7.7*   CBG:  Recent Labs Lab 02/02/17 0804  GLUCAP 165*   Lipid Profile:  Recent Labs  02/03/17 0311  CHOL 285*  HDL 58  LDLCALC 177*  TRIG 250*  CHOLHDL 4.9   Thyroid Function Tests: No results for input(s): TSH, T4TOTAL, FREET4, T3FREE, THYROIDAB in  the last 72 hours. Anemia Panel: No results for input(s): VITAMINB12, FOLATE, FERRITIN, TIBC, IRON, RETICCTPCT in the last 72 hours. Sepsis Labs:  Recent Labs Lab 02/02/17 1055 02/04/17 0424  PROCALCITON 0.11 0.17    Recent Results (from the past 240 hour(s))  Culture, blood (routine x 2) Call MD if unable to obtain prior to antibiotics being given     Status: None (Preliminary result)   Collection Time: 02/02/17 11:23 AM  Result Value Ref Range Status   Specimen Description BLOOD RIGHT HAND  Final   Special Requests BOTTLES DRAWN AEROBIC AND ANAEROBIC 5CC  Final   Culture NO GROWTH 2 DAYS  Final   Report Status PENDING  Incomplete  Culture, blood (routine x 2) Call MD if unable to obtain prior to antibiotics being given     Status: None (Preliminary result)   Collection Time: 02/02/17 11:24 AM  Result Value Ref Range Status   Specimen Description BLOOD RIGHT WRIST  Final   Special Requests BOTTLES DRAWN AEROBIC AND ANAEROBIC 5CC  Final   Culture NO GROWTH 2 DAYS  Final   Report Status PENDING  Incomplete     Radiology Studies: Dg Swallowing Func-speech Pathology  Result Date: 02/04/2017 Objective Swallowing Evaluation: Type of Study: MBS-Modified Barium Swallow Study Patient Details Name: Katie Mcfarland MRN: [id number] Date of Birth: 1920/06/17 Today's Date: 02/04/2017 Time: SLP Start Time (ACUTE ONLY): 1230-SLP Stop Time (ACUTE ONLY): 1245 SLP Time Calculation (min) (ACUTE ONLY): 15 min Past Medical History: Past Medical History: Diagnosis Date . COPD (chronic obstructive pulmonary disease) (HCC)  . Dementia  . High cholesterol  . Hypertension  Past Surgical History: Past Surgical History: Procedure Laterality Date . CHOLECYSTECTOMY   HPI: Pt is a 83 y.o. female with PMH significant for COPD, dementia, HTN. Presented to ED by EMS on 02/02/17 with new onset of L-side arm weakness. MRI shows acute infarction in the R hemispheric deep white matter adjacent to posterior body of R  lateral ventricle. CXR shows L lower lobe subsegmental atelectasis or early pneumonia. Prior BSE 09/2015 recommended Dys 3 diet and thin liquids with use of small, single sips. Daughter reports intermittent regurgitation of meds in the morning. Subjective: pt alert, pleasant Assessment / Plan / Recommendation CHL IP CLINICAL IMPRESSIONS 02/04/2017 Clinical Impression Patient presents with oropharyngeal swallowing function which is observed to be within functional limits with adequate airway protection. No aspiration, penetration observed despite challenging with multiple sips via straw and cup. Swallow initiation timely at the level of the valleculae, epiglottic deflection  complete. No abnormal residue remained in the oral cavity or pharynx after the swallow. During testing, patient with intermittent throat clear which was not attributable to decreased airway protection. This examination was completed immediately following patient's lunch meal. An esophageal sweep was performed with barium tablet in puree which was unremarkable, though view was limited due to patient position. Noted multiple eructations following the examination. Recommend regular diet with thin liquids, follow-up with GI should complaints of regurgitation, coughing during and after meals persist. SLP will follow up for additional treatment session for patient/ family education.  SLP Visit Diagnosis Dysphagia, unspecified (R13.10) Attention and concentration deficit following -- Frontal lobe and executive function deficit following -- Impact on safety and function --   CHL IP TREATMENT RECOMMENDATION 02/04/2017 Treatment Recommendations Therapy as outlined in treatment plan below   Prognosis 02/04/2017 Prognosis for Safe Diet Advancement Good Barriers to Reach Goals -- Barriers/Prognosis Comment -- CHL IP DIET RECOMMENDATION 02/04/2017 SLP Diet Recommendations Regular solids;Thin liquid Liquid Administration via Cup;Straw Medication Administration Whole  meds with puree Compensations Slow rate;Small sips/bites;Follow solids with liquid Postural Changes Remain semi-upright after after feeds/meals (Comment);Seated upright at 90 degrees   CHL IP OTHER RECOMMENDATIONS 02/04/2017 Recommended Consults -- Oral Care Recommendations Oral care BID Other Recommendations --   CHL IP FOLLOW UP RECOMMENDATIONS 02/04/2017 Follow up Recommendations None   CHL IP FREQUENCY AND DURATION 02/04/2017 Speech Therapy Frequency (ACUTE ONLY) min 1 x/week Treatment Duration 1 week      CHL IP ORAL PHASE 02/04/2017 Oral Phase WFL Oral - Pudding Teaspoon -- Oral - Pudding Cup -- Oral - Honey Teaspoon -- Oral - Honey Cup -- Oral - Nectar Teaspoon -- Oral - Nectar Cup -- Oral - Nectar Straw -- Oral - Thin Teaspoon -- Oral - Thin Cup -- Oral - Thin Straw -- Oral - Puree -- Oral - Mech Soft -- Oral - Regular -- Oral - Multi-Consistency -- Oral - Pill -- Oral Phase - Comment --  CHL IP PHARYNGEAL PHASE 02/04/2017 Pharyngeal Phase WFL Pharyngeal- Pudding Teaspoon -- Pharyngeal -- Pharyngeal- Pudding Cup -- Pharyngeal -- Pharyngeal- Honey Teaspoon -- Pharyngeal -- Pharyngeal- Honey Cup -- Pharyngeal -- Pharyngeal- Nectar Teaspoon -- Pharyngeal -- Pharyngeal- Nectar Cup -- Pharyngeal -- Pharyngeal- Nectar Straw -- Pharyngeal -- Pharyngeal- Thin Teaspoon -- Pharyngeal -- Pharyngeal- Thin Cup -- Pharyngeal -- Pharyngeal- Thin Straw -- Pharyngeal -- Pharyngeal- Puree -- Pharyngeal -- Pharyngeal- Mechanical Soft -- Pharyngeal -- Pharyngeal- Regular -- Pharyngeal -- Pharyngeal- Multi-consistency -- Pharyngeal -- Pharyngeal- Pill -- Pharyngeal -- Pharyngeal Comment --  CHL IP CERVICAL ESOPHAGEAL PHASE 02/04/2017 Cervical Esophageal Phase WFL Pudding Teaspoon -- Pudding Cup -- Honey Teaspoon -- Honey Cup -- Nectar Teaspoon -- Nectar Cup -- Nectar Straw -- Thin Teaspoon -- Thin Cup -- Thin Straw -- Puree -- Mechanical Soft -- Regular -- Multi-consistency -- Pill -- Cervical Esophageal Comment -- CHL IP GO 02/02/2017  Functional Assessment Tool Used skilled clinical judgment Functional Limitations Swallowing Swallow Current Status (Z6109(G8996) CJ Swallow Goal Status (U0454(G8997) CI Swallow Discharge Status (U9811(G8998) (None) Motor Speech Current Status (B1478(G8999) (None) Motor Speech Goal Status (G9562(G9186) (None) Motor Speech Goal Status (Z3086(G9158) (None) Spoken Language Comprehension Current Status (V7846(G9159) (None) Spoken Language Comprehension Goal Status (N6295(G9160) (None) Spoken Language Comprehension Discharge Status (M8413(G9161) (None) Spoken Language Expression Current Status (K4401(G9162) (None) Spoken Language Expression Goal Status (U2725(G9163) (None) Spoken Language Expression Discharge Status (D6644(G9164) (None) Attention Current Status (I3474(G9165) (None) Attention Goal Status (Q5956(G9166) (None) Attention Discharge Status (L8756(G9167) (None) Memory Current Status (  Z6109) (None) Memory Goal Status [phone]) (None) Memory Discharge Status [phone]) (None) Voice Current Status (B1478) (None) Voice Goal Status (G9562) (None) Voice Discharge Status [phone]) (None) Other Speech-Language Pathology Functional Limitation Current Status (V7846) (None) Other Speech-Language Pathology Functional Limitation Goal Status (N6295) (None) Other Speech-Language Pathology Functional Limitation Discharge Status [phone]) (None) Arlana Lindau 02/04/2017, 1:26 PM  Rondel Baton, MS CF-SLP Speech-Language Pathologist [phone]              Scheduled Meds: .  stroke: mapping our early stages of recovery book   Does not apply Once  . aspirin EC  325 mg Oral Daily  . atorvastatin  40 mg Oral q1800  . heparin  5,000 Units Subcutaneous Q8H  . latanoprost  1 drop Both Eyes QHS  . sodium chloride flush  3 mL Intravenous Q12H   Continuous Infusions:   LOS: 1 day    Time spent: > 35 minutes   Penny Pia, MD [facility] Pager [phone]  If 7PM-7AM, please contact night-coverage [email or web address] Password [email or web address] 02/04/2017, 4:12 PM

## 2017-02-04 NOTE — Progress Notes (Signed)
Patient blood pressure elevated left arm 225/73 and right arm 221/77.Patient denies headache or discomfort at present time.Text Paged NP Elray McgregorMary Lynch awaiting response.

## 2017-02-05 NOTE — Progress Notes (Signed)
  Speech Language Pathology Treatment: Dysphagia  Patient Details Name: Raine Blodgett MRN: [id number] DOB: 08/25/1920 Today's Date: 02/05/2017 Time: 0017-4944 SLP Time Calculation (min) (ACUTE ONLY): 10 min  Assessment / Plan / Recommendation Clinical Impression  Patient seen in room for dysphagia education/follow-up. Modified barium swallow completed yesterday which showed oropharyngeal swallow to be within functional limits with adequate airway protection; recommended regular diet with thin liquids, medications whole in applesauce or with liquids as patient prefers. Provided verbal and written education to patient and granddaughter about the above results and recommendations for follow-up with GI should symptoms of belching, coughing during or after eating, globus sensation or regurgitation persist. No further SLP follow-up is recommended at this time. SLP will s/o.   HPI HPI: Pt is a 83 y.o. female with PMH significant for COPD, dementia, HTN. Presented to ED by EMS on 02/02/17 with new onset of L-side arm weakness. MRI shows acute infarction in the R hemispheric deep white matter adjacent to posterior body of R lateral ventricle. CXR shows L lower lobe subsegmental atelectasis or early pneumonia. Prior BSE 09/2015 recommended Dys 3 diet and thin liquids with use of small, single sips. Daughter reports intermittent regurgitation of meds in the morning.      SLP Plan  Discharge SLP treatment due to (comment) (goals met, education completed)       Recommendations  Diet recommendations: Regular;Thin liquid Liquids provided via: Cup Medication Administration: Whole meds with puree Supervision: Staff to assist with self feeding;Full supervision/cueing for compensatory strategies Compensations: Slow rate;Small sips/bites;Follow solids with liquid Postural Changes and/or Swallow Maneuvers: Seated upright 90 degrees;Upright 30-60 min after meal                General  recommendations: Other(comment) (follow up with GI for esophageal complaints) Oral Care Recommendations: Oral care BID Follow up Recommendations: None SLP Visit Diagnosis: Dysphagia, unspecified (R13.10) Plan: Discharge SLP treatment due to (comment) (goals met, education completed)       [place], [place] CF-SLP Speech-Language Pathologist [phone]  Aliene Altes 02/05/2017, 3:12 PM

## 2017-02-05 NOTE — Progress Notes (Signed)
PROGRESS NOTE    Katie Mcfarland  GEX:[id number] DOB: 1920/07/15 DOA: 02/02/2017 PCP: Londell Moh, MD    Brief Narrative:  83 y.o. female with medical history significant of COPD, dementia, HTN, Hyperlipidemia who presented to the ED by EMS with nre onset of left sided weakness if the left arm. Patient was seen normal last time around 2:30 am and four hours later around 6:30 am staff found her to have significant weakness of the left arm and confusion   Assessment & Plan:   Principal Problem:   Acute ischemic stroke [facility]) - Neurology has evaluated. Plan is for SNF placement. Social worker assisting  Active Problems:   Dementia with behavioral disturbance - Stable   HLD (hyperlipidemia) - Stable continue statin   HTN (hypertension) - Allow for permissive hypertension given new diagnosis stroke. Should systolic blood pressure 220 or above or diastolic blood pressure 110 or above then would plan on providing when necessary blood pressure medication. May do Hydralazine 5 mg q 6 hours prn  DVT prophylaxis: Heparin Code Status: dnr Family Communication: d/c power of attorney Disposition Plan: SNF placement. Most likely Monday as patient is unable to return back to her facility due to new deficits.  Consultants:   Neurology   Procedures: None   Antimicrobials: None   Subjective: Pt has no new concerns reported to me today.  Objective: Vitals:   02/05/17 0100 02/05/17 0539 02/05/17 0855 02/05/17 1359  BP: (!) 189/66 (!) 165/75 (!) 175/53 (!) 192/66  Pulse: 85 71 84 74  Resp: 20 20 18 18   Temp: 98.2 F (36.8 C) 98.6 F (37 C) 98.7 F (37.1 C) 98.1 F (36.7 C)  TempSrc: Oral  Oral Oral  SpO2: 96% 95% 96% 98%   No intake or output data in the 24 hours ending 02/05/17 1533 There were no vitals filed for this visit.  Examination:  General exam: Appears calm and comfortable, in nad. Respiratory system: Clear to auscultation. Respiratory effort  normal. Cardiovascular system: S1 & S2 heard, RRR. No JVD, murmurs Gastrointestinal system: Abdomen is nondistended, soft and nontender. No organomegaly or masses felt. Normal bowel sounds heard. Central nervous system: LUE weakness Extremities: no cyanosis Skin: No rashes, lesions or ulcers, on limited exam. Psychiatry: Mood & affect appropriate.     Data Reviewed: I have personally reviewed following labs and imaging studies  CBC:  Recent Labs Lab 02/02/17 0657 02/02/17 0703 02/02/17 1055 02/03/17 0311  WBC 8.7  --  9.5 6.9  NEUTROABS 6.1  --   --  4.4  HGB 13.9 14.3 13.3 13.3  HCT 41.0 42.0 39.6 39.3  MCV 92.8  --  92.5 92.9  PLT 214  --  213 195   Basic Metabolic Panel:  Recent Labs Lab 02/02/17 0657 02/02/17 0703 02/02/17 1055 02/03/17 0311  NA 136 136  --  137  K 3.9 3.8  --  3.6  CL 99* 99*  --  98*  CO2 28  --   --  26  GLUCOSE 179* 182*  --  153*  BUN 11 13  --  9  CREATININE 0.83 0.70 0.72 0.84  CALCIUM 10.2  --   --  10.2   GFR: CrCl cannot be calculated (Unknown ideal weight.). Liver Function Tests:  Recent Labs Lab 02/02/17 0657  AST 55*  ALT 72*  ALKPHOS 152*  BILITOT 0.7  PROT 7.5  ALBUMIN 3.6   No results for input(s): LIPASE, AMYLASE in the last 168 hours. No results  for input(s): AMMONIA in the last 168 hours. Coagulation Profile:  Recent Labs Lab 02/02/17 0657  INR 0.94   Cardiac Enzymes: No results for input(s): CKTOTAL, CKMB, CKMBINDEX, TROPONINI in the last 168 hours. BNP (last 3 results) No results for input(s): PROBNP in the last 8760 hours. HbA1C:  Recent Labs  02/03/17 0311  HGBA1C 7.7*   CBG:  Recent Labs Lab 02/02/17 0804  GLUCAP 165*   Lipid Profile:  Recent Labs  02/03/17 0311  CHOL 285*  HDL 58  LDLCALC 177*  TRIG 250*  CHOLHDL 4.9   Thyroid Function Tests: No results for input(s): TSH, T4TOTAL, FREET4, T3FREE, THYROIDAB in the last 72 hours. Anemia Panel: No results for input(s):  VITAMINB12, FOLATE, FERRITIN, TIBC, IRON, RETICCTPCT in the last 72 hours. Sepsis Labs:  Recent Labs Lab 02/02/17 1055 02/04/17 0424  PROCALCITON 0.11 0.17    Recent Results (from the past 240 hour(s))  Culture, blood (routine x 2) Call MD if unable to obtain prior to antibiotics being given     Status: None (Preliminary result)   Collection Time: 02/02/17 11:23 AM  Result Value Ref Range Status   Specimen Description BLOOD RIGHT HAND  Final   Special Requests BOTTLES DRAWN AEROBIC AND ANAEROBIC 5CC  Final   Culture NO GROWTH 3 DAYS  Final   Report Status PENDING  Incomplete  Culture, blood (routine x 2) Call MD if unable to obtain prior to antibiotics being given     Status: None (Preliminary result)   Collection Time: 02/02/17 11:24 AM  Result Value Ref Range Status   Specimen Description BLOOD RIGHT WRIST  Final   Special Requests BOTTLES DRAWN AEROBIC AND ANAEROBIC 5CC  Final   Culture NO GROWTH 3 DAYS  Final   Report Status PENDING  Incomplete     Radiology Studies: Dg Swallowing Func-speech Pathology  Result Date: 02/04/2017 Objective Swallowing Evaluation: Type of Study: MBS-Modified Barium Swallow Study Patient Details Name: Katie Mcfarland MRN: [id number] Date of Birth: 11-19-1920 Today's Date: 02/04/2017 Time: SLP Start Time (ACUTE ONLY): 1230-SLP Stop Time (ACUTE ONLY): 1245 SLP Time Calculation (min) (ACUTE ONLY): 15 min Past Medical History: Past Medical History: Diagnosis Date . COPD (chronic obstructive pulmonary disease) (HCC)  . Dementia  . High cholesterol  . Hypertension  Past Surgical History: Past Surgical History: Procedure Laterality Date . CHOLECYSTECTOMY   HPI: Pt is a 83 y.o. female with PMH significant for COPD, dementia, HTN. Presented to ED by EMS on 02/02/17 with new onset of L-side arm weakness. MRI shows acute infarction in the R hemispheric deep white matter adjacent to posterior body of R lateral ventricle. CXR shows L lower lobe subsegmental  atelectasis or early pneumonia. Prior BSE 09/2015 recommended Dys 3 diet and thin liquids with use of small, single sips. Daughter reports intermittent regurgitation of meds in the morning. Subjective: pt alert, pleasant Assessment / Plan / Recommendation CHL IP CLINICAL IMPRESSIONS 02/04/2017 Clinical Impression Patient presents with oropharyngeal swallowing function which is observed to be within functional limits with adequate airway protection. No aspiration, penetration observed despite challenging with multiple sips via straw and cup. Swallow initiation timely at the level of the valleculae, epiglottic deflection complete. No abnormal residue remained in the oral cavity or pharynx after the swallow. During testing, patient with intermittent throat clear which was not attributable to decreased airway protection. This examination was completed immediately following patient's lunch meal. An esophageal sweep was performed with barium tablet in puree which was unremarkable, though view  was limited due to patient position. Noted multiple eructations following the examination. Recommend regular diet with thin liquids, follow-up with GI should complaints of regurgitation, coughing during and after meals persist. SLP will follow up for additional treatment session for patient/ family education.  SLP Visit Diagnosis Dysphagia, unspecified (R13.10) Attention and concentration deficit following -- Frontal lobe and executive function deficit following -- Impact on safety and function --   CHL IP TREATMENT RECOMMENDATION 02/04/2017 Treatment Recommendations Therapy as outlined in treatment plan below   Prognosis 02/04/2017 Prognosis for Safe Diet Advancement Good Barriers to Reach Goals -- Barriers/Prognosis Comment -- CHL IP DIET RECOMMENDATION 02/04/2017 SLP Diet Recommendations Regular solids;Thin liquid Liquid Administration via Cup;Straw Medication Administration Whole meds with puree Compensations Slow rate;Small  sips/bites;Follow solids with liquid Postural Changes Remain semi-upright after after feeds/meals (Comment);Seated upright at 90 degrees   CHL IP OTHER RECOMMENDATIONS 02/04/2017 Recommended Consults -- Oral Care Recommendations Oral care BID Other Recommendations --   CHL IP FOLLOW UP RECOMMENDATIONS 02/04/2017 Follow up Recommendations None   CHL IP FREQUENCY AND DURATION 02/04/2017 Speech Therapy Frequency (ACUTE ONLY) min 1 x/week Treatment Duration 1 week      CHL IP ORAL PHASE 02/04/2017 Oral Phase WFL Oral - Pudding Teaspoon -- Oral - Pudding Cup -- Oral - Honey Teaspoon -- Oral - Honey Cup -- Oral - Nectar Teaspoon -- Oral - Nectar Cup -- Oral - Nectar Straw -- Oral - Thin Teaspoon -- Oral - Thin Cup -- Oral - Thin Straw -- Oral - Puree -- Oral - Mech Soft -- Oral - Regular -- Oral - Multi-Consistency -- Oral - Pill -- Oral Phase - Comment --  CHL IP PHARYNGEAL PHASE 02/04/2017 Pharyngeal Phase WFL Pharyngeal- Pudding Teaspoon -- Pharyngeal -- Pharyngeal- Pudding Cup -- Pharyngeal -- Pharyngeal- Honey Teaspoon -- Pharyngeal -- Pharyngeal- Honey Cup -- Pharyngeal -- Pharyngeal- Nectar Teaspoon -- Pharyngeal -- Pharyngeal- Nectar Cup -- Pharyngeal -- Pharyngeal- Nectar Straw -- Pharyngeal -- Pharyngeal- Thin Teaspoon -- Pharyngeal -- Pharyngeal- Thin Cup -- Pharyngeal -- Pharyngeal- Thin Straw -- Pharyngeal -- Pharyngeal- Puree -- Pharyngeal -- Pharyngeal- Mechanical Soft -- Pharyngeal -- Pharyngeal- Regular -- Pharyngeal -- Pharyngeal- Multi-consistency -- Pharyngeal -- Pharyngeal- Pill -- Pharyngeal -- Pharyngeal Comment --  CHL IP CERVICAL ESOPHAGEAL PHASE 02/04/2017 Cervical Esophageal Phase WFL Pudding Teaspoon -- Pudding Cup -- Honey Teaspoon -- Honey Cup -- Nectar Teaspoon -- Nectar Cup -- Nectar Straw -- Thin Teaspoon -- Thin Cup -- Thin Straw -- Puree -- Mechanical Soft -- Regular -- Multi-consistency -- Pill -- Cervical Esophageal Comment -- CHL IP GO 02/02/2017 Functional Assessment Tool Used skilled clinical  judgment Functional Limitations Swallowing Swallow Current Status (W0981) CJ Swallow Goal Status (X9147) CI Swallow Discharge Status (W2956) (None) Motor Speech Current Status (O1308) (None) Motor Speech Goal Status (M5784) (None) Motor Speech Goal Status (O9629) (None) Spoken Language Comprehension Current Status (B2841) (None) Spoken Language Comprehension Goal Status (L2440) (None) Spoken Language Comprehension Discharge Status (N0272) (None) Spoken Language Expression Current Status (Z3664) (None) Spoken Language Expression Goal Status (Q0347) (None) Spoken Language Expression Discharge Status [phone]) (None) Attention Current Status (G3875) (None) Attention Goal Status (I4332) (None) Attention Discharge Status (R5188) (None) Memory Current Status (C1660) (None) Memory Goal Status (Y3016) (None) Memory Discharge Status (W1093) (None) Voice Current Status (A3557) (None) Voice Goal Status (D2202) (None) Voice Discharge Status (R4270) (None) Other Speech-Language Pathology Functional Limitation Current Status (W2376) (None) Other Speech-Language Pathology Functional Limitation Goal Status (E8315) (None) Other Speech-Language Pathology Functional Limitation Discharge Status [phone]) (  None) Arlana Lindau 02/04/2017, 1:26 PM  Rondel Baton, MS CF-SLP Speech-Language Pathologist [phone]              Scheduled Meds: .  stroke: mapping our early stages of recovery book   Does not apply Once  . aspirin EC  325 mg Oral Daily  . atorvastatin  40 mg Oral q1800  . heparin  5,000 Units Subcutaneous Q8H  . latanoprost  1 drop Both Eyes QHS  . sodium chloride flush  3 mL Intravenous Q12H   Continuous Infusions:   LOS: 2 days    Time spent: > 35 minutes   Penny Pia, MD [facility] Pager [phone]  If 7PM-7AM, please contact night-coverage [email or web address] Password [facility] 02/05/2017, 3:33 PM

## 2017-02-06 LAB — PROCALCITONIN: Procalcitonin: 0.2 ng/mL

## 2017-02-06 MED ORDER — BENZONATATE 100 MG PO CAPS: 100.0000 mg | ORAL_CAPSULE | Freq: Three times a day (TID) | ORAL | 0 refills | Status: DC | PRN

## 2017-02-06 MED ORDER — ASPIRIN 325 MG PO TBEC: 325.0000 mg | DELAYED_RELEASE_TABLET | Freq: Every day | ORAL | 0 refills | Status: DC

## 2017-02-06 MED ORDER — ATORVASTATIN CALCIUM 40 MG PO TABS: 40.0000 mg | ORAL_TABLET | Freq: Every day | ORAL | 0 refills | Status: AC

## 2017-02-06 NOTE — Clinical Social Work Placement (Signed)
   CLINICAL SOCIAL WORK PLACEMENT  NOTE  Date:  02/06/2017  Patient Details  Name: Katie Mcfarland MRN: [id number] Date of Birth: 11/09/1920  Clinical Social Work is seeking post-discharge placement for this patient at the Skilled  Nursing Facility level of care (*CSW will initial, date and re-position this form in  chart as items are completed):  Yes   Patient/family provided with [place] Clinical Social Work Department's list of facilities offering this level of care within the geographic area requested by the patient (or if unable, by the patient's family).  Yes   Patient/family informed of their freedom to choose among providers that offer the needed level of care, that participate in Medicare, Medicaid or managed care program needed by the patient, have an available bed and are willing to accept the patient.  Yes   Patient/family informed of [facility] ownership interest in [facility] and [facility], as well as of the fact that they are under no obligation to receive care at these facilities.  PASRR submitted to EDS on 02/03/17     PASRR number received on 02/03/17     Existing PASRR number confirmed on       FL2 transmitted to all facilities in geographic area requested by pt/family on 02/03/17     FL2 transmitted to all facilities within larger geographic area on       Patient informed that his/her managed care company has contracts with or will negotiate with certain facilities, including the following:        Yes   Patient/family informed of bed offers received.  [facility]     Physician recommends and patient chooses bed at      [facility].  Patient to be transferred to facility by [facility]     Patient family notified on 02/06/17 of transfer.  Name of family member notified:  Bonita QuinLinda     PHYSICIAN Please sign FL2, Please sign DNR     Additional Comment:     _______________________________________________ Mearl LatinNadia S Amaziah Raisanen, LCSWA 02/06/2017, 2:29 PM

## 2017-02-06 NOTE — Discharge Summary (Signed)
Physician Discharge Summary  Katie Mcfarland WUJ:[id number] DOB: 10/23/1920 DOA: 02/02/2017  PCP: Londell Moh, MD  Admit date: 02/02/2017 Discharge date: 02/06/2017  Time spent:> 35  minutes  Recommendations for Outpatient Follow-up:  1. May start to gradually normalize blood pressures starting tomorrow 3/6 2. Decide when to continue lasix 3. hgb a1c 7.7   Discharge Diagnoses:  Principal Problem:   Acute ischemic stroke [facility]) Active Problems:   Pneumonia   Dementia with behavioral disturbance   HLD (hyperlipidemia)   HTN (hypertension)   Discharge Condition: stable  Diet recommendation: Carb modified diet  Filed Weights   02/06/17 0412  Weight: 81.4 kg (179 lb 9 oz)    History of present illness:  83 y.o.femalewith medical history significant of COPD, dementia, HTN, Hyperlipidemia who presented to the ED by EMS with acute onset of left sided weakness if the left arm. Patient was seen normal last time around 2:30 am and four hours later around 6:30 am staff found her to have significant weakness of the left arm and confusion  Diagnosed with new stroke  Hospital Course:  Principal Problem:   Acute ischemic stroke [facility]) - Neurology has evaluated while patient in house. Plan is for SNF placement.    Resultant  Left hemiparesis  Code stroke CT R insular hypoattenuation, ? Infarct. Possible hyperdense R MCA M3.  MRI  R CR small infarct.   MRA  Unremarkable   Carotid Doppler  unremarkable  2D Echo  EF 65-70%  LDL 177  HgbA1c pending  Heparin 5000 units sq tid for VTE prophylaxis  DIET DYS 3 Room service appropriate? Yes; Fluid consistency: Thin  No antithrombotic prior to admission, now on aspirin 325 mg daily. Continue ASA on discharge.  Active Problems:   Dementia with behavioral disturbance - Stable    HLD (hyperlipidemia) - Stable continue statin    HTN (hypertension) - Allow for permissive hypertension and gradually start to  normalize starting 3/6  DM type 2 - diabetic diet - recommend monitoring blood sugar levels.  Procedures: Ct Head Code Stroke W/o Cm 02/02/2017 1. Atrophy and small vessel disease with evidence of remote supratentorial and infratentorial ischemia. Subtle asymmetry of hypoattenuation, RIGHT insula versus LEFT could represent early infarction. Possible hyperdense RIGHT MCA branch, probably M3, in the sylvian fissure. 2. ASPECTS is 9.   Mr Brain 63 Contrast Mr Angiogram Head Wo Contrast 02/02/2017 1 cm acute infarction in the right hemispheric deep white matter adjacent to the posterior body of the right lateral ventricle. Old small vessel infarctions and chronic microangiopathic changes. Negative intracranial MR angiography of the large and medium size vessels. Ventricular prominence, probably largely due to central atrophy. Question if this is slightly progressive since 2016. Is there any clinical sign of normal pressure hydrocephalus?   CUS - Bilateral 1-39% ICA stenosis,tortuous vessels bilaterally, difficult exam.  TTE - Left ventricle: The cavity size was normal. Wall thickness was increased in a pattern of moderate LVH. Systolic function was vigorous. The estimated ejection fraction was in the range of 65% to 70%. Wall motion was normal; there were no regional wall motion abnormalities. Doppler parameters are consistent with abnormal left ventricular relaxation (grade 1 diastolic dysfunction). - Aortic valve: There was trivial regurgitation. - Aortic root: The aortic root was mildly dilated. Impressions: - Technically difficult; definity used; vigorous LV systolic function; moderate LVH; elevated LVOT gradient of approximately 2 m/s likely related to vigorous LV systolic function.   Consultations:  Neurology: Dr. Otelia Limes  Discharge Exam: Vitals:  02/06/17 0500 02/06/17 0930  BP: (!) 168/67 (!) 167/59  Pulse: 70 81  Resp: 18 20  Temp: 98.1 F (36.7 C)  97.8 F (36.6 C)    General: Pt in nad, alert and awake Cardiovascular: rrr, no rubs  Respiratory: no increased wob, no wheezes  Discharge Instructions   Discharge Instructions    Call MD for:  temperature >100.4    Complete by:  As directed    Diet - low sodium heart healthy    Complete by:  As directed    Discharge instructions    Complete by:  As directed    Please gradually restart patient's blood pressure medication   Increase activity slowly    Complete by:  As directed      Current Discharge Medication List    START taking these medications   Details  aspirin EC 325 MG EC tablet Take 1 tablet (325 mg total) by mouth daily. Qty: 30 tablet, Refills: 0    atorvastatin (LIPITOR) 40 MG tablet Take 1 tablet (40 mg total) by mouth daily at 6 PM. Qty: 30 tablet, Refills: 0    benzonatate (TESSALON) 100 MG capsule Take 1 capsule (100 mg total) by mouth 3 (three) times daily as needed for cough. Qty: 20 capsule, Refills: 0      CONTINUE these medications which have NOT CHANGED   Details  albuterol (PROAIR HFA) 108 (90 Base) MCG/ACT inhaler Inhale 2 puffs into the lungs every 6 (six) hours as needed for wheezing or shortness of breath.    cholecalciferol (VITAMIN D) 1000 units tablet Take 2,000 Units by mouth daily.    Ipratropium-Albuterol (COMBIVENT RESPIMAT) 20-100 MCG/ACT AERS respimat Inhale 1 puff into the lungs 4 (four) times daily.    latanoprost (XALATAN) 0.005 % ophthalmic solution Place 1 drop into both eyes at bedtime.     loperamide (IMODIUM A-D) 2 MG tablet Take 2 mg by mouth daily as needed for diarrhea or loose stools.    LORazepam (ATIVAN) 0.5 MG tablet Take 0.25 mg by mouth every 6 (six) hours as needed for anxiety.    Multiple Vitamin (DAILY VITE) TABS Take 1 tablet by mouth daily.    omeprazole (PRILOSEC) 40 MG capsule Take 40 mg by mouth daily.    polycarbophil (FIBERCON) 625 MG tablet Take 625 mg by mouth 2 (two) times daily.    polyvinyl  alcohol (ARTIFICIAL TEARS) 1.4 % ophthalmic solution Place 2 drops into both eyes 4 (four) times daily.    PRESCRIPTION MEDICATION Apply 1 application topically See admin instructions. Apply "Lidocaine Plus Cream" to right knee twice daily      STOP taking these medications     amLODipine (NORVASC) 5 MG tablet      fexofenadine (ALLEGRA) 180 MG tablet      furosemide (LASIX) 20 MG tablet      guaifenesin (ROBITUSSIN) 100 MG/5ML syrup      HYDROcodone-acetaminophen (NORCO/VICODIN) 5-325 MG tablet      Melatonin 3 MG TABS      Mineral Oil Heavy OIL      spiritus frumenti (ETHYL ALCOHOL) SOLN      traZODone (DESYREL) 50 MG tablet      traMADol (ULTRAM) 50 MG tablet        No Known Allergies  Contact information for follow-up providers    Nilda Riggs, NP. Schedule an appointment as soon as possible for a visit in 6 week(s).   Specialty:  Family Medicine Contact information: [address]  [address] [place] [address] [phone]            Contact information for after-discharge care    Destination    [facility] .   Specialty:  Skilled Nursing Facility Contact information: [address] [place] [address] [phone]                   The results of significant diagnostics from this hospitalization (including imaging, microbiology, ancillary and laboratory) are listed below for reference.    Significant Diagnostic Studies: Dg Chest 2 View  Result Date: 02/02/2017 CLINICAL DATA:  Left-sided weakness, possible CVA, nonsmoker. History of dementia and encephalopathy and COPD. EXAM: CHEST  2 VIEW COMPARISON:  Portable chest x-ray of September 18, 2015 FINDINGS: The lungs are reasonably well inflated. There is mildly increased density in the left lower lobe. There is no pleural effusion or pneumothorax. The cardiac silhouette is enlarged. The pulmonary vascularity is not engorged. There is calcification in the wall of the  thoracic aorta. The bony thorax exhibits no acute abnormality. There is multilevel degenerative disc disease of the thoracic spine. IMPRESSION: Cardiomegaly without pulmonary edema. Left lower lobe subsegmental atelectasis or early pneumonia. Thoracic aortic atherosclerosis. Electronically Signed   By: David  [place] M.D.   On: 02/02/2017 07:57   Mr Angiogram Head Wo Contrast  Result Date: 02/02/2017 CLINICAL DATA:  Dementia. Acute presentation with left-sided weakness. EXAM: MRI HEAD WITHOUT CONTRAST MRA HEAD WITHOUT CONTRAST TECHNIQUE: Multiplanar, multiecho pulse sequences of the brain and surrounding structures were obtained without intravenous contrast. Angiographic images of the head were obtained using MRA technique without contrast. COMPARISON:  Head CT same day and 09/18/2015 FINDINGS: MRI HEAD FINDINGS Brain: Diffusion imaging shows a 1 cm acute infarction in the deep white matter adjacent to the posterior body of the right lateral ventricle. No other acute infarction. No evidence of large vessel territory infarction. Brainstem is normal. There are old small vessel cerebellar infarctions bilaterally. Cerebral hemispheres show old infarction in the right external capsule and old cortical and subcortical infarction in the right posterior frontal region. There are moderate chronic small-vessel ischemic changes throughout the white matter. Ventricles are prominent, consistent with central atrophy. Question slight increase in ventricular prominence since 2016, raising at least the question of normal pressure hydrocephalus. Vascular: Major vessels at the base of the brain show flow. Skull and upper cervical spine: Negative Sinuses/Orbits: Clear/normal Other: None significant MRA HEAD FINDINGS Both internal carotid arteries are patent into the brain. No siphon stenosis. The anterior and middle cerebral vessels are patent without proximal stenosis, aneurysm or vascular malformation. Both vertebral arteries are  patent to the basilar. No basilar stenosis. Posterior circulation branch vessels are normal proximally. More distal intracranial vessels show some atherosclerotic irregularity diffusely. IMPRESSION: 1 cm acute infarction in the right hemispheric deep white matter adjacent to the posterior body of the right lateral ventricle. Old small vessel infarctions and chronic microangiopathic changes. Negative intracranial MR angiography of the large and medium size vessels. Ventricular prominence, probably largely due to central atrophy. Question if this is slightly progressive since 2016. Is there any clinical sign of normal pressure hydrocephalus? Electronically Signed   By: Paulina Fusi M.D.   On: 02/02/2017 10:01   Mr Brain Wo Contrast  Result Date: 02/02/2017 CLINICAL DATA:  Dementia. Acute presentation with left-sided weakness. EXAM: MRI HEAD WITHOUT CONTRAST MRA HEAD WITHOUT CONTRAST TECHNIQUE: Multiplanar, multiecho pulse sequences of the brain and surrounding structures were obtained without intravenous contrast. Angiographic images  of the head were obtained using MRA technique without contrast. COMPARISON:  Head CT same day and 09/18/2015 FINDINGS: MRI HEAD FINDINGS Brain: Diffusion imaging shows a 1 cm acute infarction in the deep white matter adjacent to the posterior body of the right lateral ventricle. No other acute infarction. No evidence of large vessel territory infarction. Brainstem is normal. There are old small vessel cerebellar infarctions bilaterally. Cerebral hemispheres show old infarction in the right external capsule and old cortical and subcortical infarction in the right posterior frontal region. There are moderate chronic small-vessel ischemic changes throughout the white matter. Ventricles are prominent, consistent with central atrophy. Question slight increase in ventricular prominence since 2016, raising at least the question of normal pressure hydrocephalus. Vascular: Major vessels at the  base of the brain show flow. Skull and upper cervical spine: Negative Sinuses/Orbits: Clear/normal Other: None significant MRA HEAD FINDINGS Both internal carotid arteries are patent into the brain. No siphon stenosis. The anterior and middle cerebral vessels are patent without proximal stenosis, aneurysm or vascular malformation. Both vertebral arteries are patent to the basilar. No basilar stenosis. Posterior circulation branch vessels are normal proximally. More distal intracranial vessels show some atherosclerotic irregularity diffusely. IMPRESSION: 1 cm acute infarction in the right hemispheric deep white matter adjacent to the posterior body of the right lateral ventricle. Old small vessel infarctions and chronic microangiopathic changes. Negative intracranial MR angiography of the large and medium size vessels. Ventricular prominence, probably largely due to central atrophy. Question if this is slightly progressive since 2016. Is there any clinical sign of normal pressure hydrocephalus? Electronically Signed   By: Paulina Fusi M.D.   On: 02/02/2017 10:01   Dg Swallowing Func-speech Pathology  Result Date: 02/04/2017 Objective Swallowing Evaluation: Type of Study: MBS-Modified Barium Swallow Study Patient Details Name: Maryland Stell MRN: [id number] Date of Birth: 03-03-1920 Today's Date: 02/04/2017 Time: SLP Start Time (ACUTE ONLY): 1230-SLP Stop Time (ACUTE ONLY): 1245 SLP Time Calculation (min) (ACUTE ONLY): 15 min Past Medical History: Past Medical History: Diagnosis Date . COPD (chronic obstructive pulmonary disease) (HCC)  . Dementia  . High cholesterol  . Hypertension  Past Surgical History: Past Surgical History: Procedure Laterality Date . CHOLECYSTECTOMY   HPI: Pt is a 83 y.o. female with PMH significant for COPD, dementia, HTN. Presented to ED by EMS on 02/02/17 with new onset of L-side arm weakness. MRI shows acute infarction in the R hemispheric deep white matter adjacent to posterior  body of R lateral ventricle. CXR shows L lower lobe subsegmental atelectasis or early pneumonia. Prior BSE 09/2015 recommended Dys 3 diet and thin liquids with use of small, single sips. Daughter reports intermittent regurgitation of meds in the morning. Subjective: pt alert, pleasant Assessment / Plan / Recommendation CHL IP CLINICAL IMPRESSIONS 02/04/2017 Clinical Impression Patient presents with oropharyngeal swallowing function which is observed to be within functional limits with adequate airway protection. No aspiration, penetration observed despite challenging with multiple sips via straw and cup. Swallow initiation timely at the level of the valleculae, epiglottic deflection complete. No abnormal residue remained in the oral cavity or pharynx after the swallow. During testing, patient with intermittent throat clear which was not attributable to decreased airway protection. This examination was completed immediately following patient's lunch meal. An esophageal sweep was performed with barium tablet in puree which was unremarkable, though view was limited due to patient position. Noted multiple eructations following the examination. Recommend regular diet with thin liquids, follow-up with GI should complaints of regurgitation, coughing during  and after meals persist. SLP will follow up for additional treatment session for patient/ family education.  SLP Visit Diagnosis Dysphagia, unspecified (R13.10) Attention and concentration deficit following -- Frontal lobe and executive function deficit following -- Impact on safety and function --   CHL IP TREATMENT RECOMMENDATION 02/04/2017 Treatment Recommendations Therapy as outlined in treatment plan below   Prognosis 02/04/2017 Prognosis for Safe Diet Advancement Good Barriers to Reach Goals -- Barriers/Prognosis Comment -- CHL IP DIET RECOMMENDATION 02/04/2017 SLP Diet Recommendations Regular solids;Thin liquid Liquid Administration via Cup;Straw Medication Administration  Whole meds with puree Compensations Slow rate;Small sips/bites;Follow solids with liquid Postural Changes Remain semi-upright after after feeds/meals (Comment);Seated upright at 90 degrees   CHL IP OTHER RECOMMENDATIONS 02/04/2017 Recommended Consults -- Oral Care Recommendations Oral care BID Other Recommendations --   CHL IP FOLLOW UP RECOMMENDATIONS 02/04/2017 Follow up Recommendations None   CHL IP FREQUENCY AND DURATION 02/04/2017 Speech Therapy Frequency (ACUTE ONLY) min 1 x/week Treatment Duration 1 week      CHL IP ORAL PHASE 02/04/2017 Oral Phase WFL Oral - Pudding Teaspoon -- Oral - Pudding Cup -- Oral - Honey Teaspoon -- Oral - Honey Cup -- Oral - Nectar Teaspoon -- Oral - Nectar Cup -- Oral - Nectar Straw -- Oral - Thin Teaspoon -- Oral - Thin Cup -- Oral - Thin Straw -- Oral - Puree -- Oral - Mech Soft -- Oral - Regular -- Oral - Multi-Consistency -- Oral - Pill -- Oral Phase - Comment --  CHL IP PHARYNGEAL PHASE 02/04/2017 Pharyngeal Phase WFL Pharyngeal- Pudding Teaspoon -- Pharyngeal -- Pharyngeal- Pudding Cup -- Pharyngeal -- Pharyngeal- Honey Teaspoon -- Pharyngeal -- Pharyngeal- Honey Cup -- Pharyngeal -- Pharyngeal- Nectar Teaspoon -- Pharyngeal -- Pharyngeal- Nectar Cup -- Pharyngeal -- Pharyngeal- Nectar Straw -- Pharyngeal -- Pharyngeal- Thin Teaspoon -- Pharyngeal -- Pharyngeal- Thin Cup -- Pharyngeal -- Pharyngeal- Thin Straw -- Pharyngeal -- Pharyngeal- Puree -- Pharyngeal -- Pharyngeal- Mechanical Soft -- Pharyngeal -- Pharyngeal- Regular -- Pharyngeal -- Pharyngeal- Multi-consistency -- Pharyngeal -- Pharyngeal- Pill -- Pharyngeal -- Pharyngeal Comment --  CHL IP CERVICAL ESOPHAGEAL PHASE 02/04/2017 Cervical Esophageal Phase WFL Pudding Teaspoon -- Pudding Cup -- Honey Teaspoon -- Honey Cup -- Nectar Teaspoon -- Nectar Cup -- Nectar Straw -- Thin Teaspoon -- Thin Cup -- Thin Straw -- Puree -- Mechanical Soft -- Regular -- Multi-consistency -- Pill -- Cervical Esophageal Comment -- CHL IP GO 02/02/2017  Functional Assessment Tool Used skilled clinical judgment Functional Limitations Swallowing Swallow Current Status (Z6109) CJ Swallow Goal Status (U0454) CI Swallow Discharge Status (U9811) (None) Motor Speech Current Status (B1478) (None) Motor Speech Goal Status (G9562) (None) Motor Speech Goal Status (Z3086) (None) Spoken Language Comprehension Current Status (V7846) (None) Spoken Language Comprehension Goal Status (N6295) (None) Spoken Language Comprehension Discharge Status (M8413) (None) Spoken Language Expression Current Status (K4401) (None) Spoken Language Expression Goal Status (U2725) (None) Spoken Language Expression Discharge Status [id number]) (None) Attention Current Status (I3474) (None) Attention Goal Status (Q5956) (None) Attention Discharge Status (L8756) (None) Memory Current Status (E3329) (None) Memory Goal Status (J1884) (None) Memory Discharge Status (Z6606) (None) Voice Current Status (T0160) (None) Voice Goal Status (F0932) (None) Voice Discharge Status (T5573) (None) Other Speech-Language Pathology Functional Limitation Current Status (U2025) (None) Other Speech-Language Pathology Functional Limitation Goal Status (K2706) (None) Other Speech-Language Pathology Functional Limitation Discharge Status [phone]) (None) Arlana Lindau 02/04/2017, 1:26 PM  Rondel Baton, MS CF-SLP Speech-Language Pathologist [phone]  Ct Head Code Stroke W/o Cm  Result Date: 02/02/2017 CLINICAL DATA:  Code stroke. LEFT-sided weakness. Last seen normal 02:30 a.m. EXAM: CT HEAD WITHOUT CONTRAST TECHNIQUE: Contiguous axial images were obtained from the base of the skull through the vertex without intravenous contrast. COMPARISON:  09/18/2015. FINDINGS: Brain: Generalized atrophy with chronic microvascular ischemic change. Slight asymmetric hypoattenuation of the RIGHT insula versus LEFT, loss of insular ribbon, could represent early infarction. No hemorrhage, mass lesion, or extra-axial fluid. Hydrocephalus  ex vacuo. Moderate-sized remote RIGHT frontal cortical infarct. BILATERAL cerebellar infarcts, of a chronic nature. Vascular: There is no asymmetry of attenuation of the M1 MCA segments. Possible asymmetric hyperattenuation of a MCA branch in the sylvian fissure, so-called "dot sign", see image 14 series 201. Skull: Normal. Negative for fracture or focal lesion. Sinuses/Orbits: No acute finding. Other: None. Compared with prior CT from 09/18/2015, the degree of atrophy is similar. RIGHT frontal infarction has progressed since that time. BILATERAL cerebellar infarcts are similar. ASPECTS [facility] Stroke Program Early CT Score) - Ganglionic level infarction (caudate, lentiform nuclei, internal capsule, insula, M1-M3 cortex): 6(insula). - Supraganglionic infarction (M4-M6 cortex): 3 Total score (0-10 with 10 being normal): 9 IMPRESSION: 1. Atrophy and small vessel disease with evidence of remote supratentorial and infratentorial ischemia. Subtle asymmetry of hypoattenuation, RIGHT insula versus LEFT could represent early infarction. Possible hyperdense RIGHT MCA branch, probably M3, in the sylvian fissure. 2. ASPECTS is 9. These results were called by telephone at the time of interpretation on 02/02/2017 at 7:17 am to Dr. Otelia Limes , who verbally acknowledged these results. Electronically Signed   By: Elsie Stain M.D.   On: 02/02/2017 07:22    Microbiology: Recent Results (from the past 240 hour(s))  Culture, blood (routine x 2) Call MD if unable to obtain prior to antibiotics being given     Status: None (Preliminary result)   Collection Time: 02/02/17 11:23 AM  Result Value Ref Range Status   Specimen Description BLOOD RIGHT HAND  Final   Special Requests BOTTLES DRAWN AEROBIC AND ANAEROBIC 5CC  Final   Culture NO GROWTH 3 DAYS  Final   Report Status PENDING  Incomplete  Culture, blood (routine x 2) Call MD if unable to obtain prior to antibiotics being given     Status: None (Preliminary result)    Collection Time: 02/02/17 11:24 AM  Result Value Ref Range Status   Specimen Description BLOOD RIGHT WRIST  Final   Special Requests BOTTLES DRAWN AEROBIC AND ANAEROBIC 5CC  Final   Culture NO GROWTH 3 DAYS  Final   Report Status PENDING  Incomplete     Labs: Basic Metabolic Panel:  Recent Labs Lab 02/02/17 0657 02/02/17 0703 02/02/17 1055 02/03/17 0311  NA 136 136  --  137  K 3.9 3.8  --  3.6  CL 99* 99*  --  98*  CO2 28  --   --  26  GLUCOSE 179* 182*  --  153*  BUN 11 13  --  9  CREATININE 0.83 0.70 0.72 0.84  CALCIUM 10.2  --   --  10.2   Liver Function Tests:  Recent Labs Lab 02/02/17 0657  AST 55*  ALT 72*  ALKPHOS 152*  BILITOT 0.7  PROT 7.5  ALBUMIN 3.6   No results for input(s): LIPASE, AMYLASE in the last 168 hours. No results for input(s): AMMONIA in the last 168 hours. CBC:  Recent Labs Lab 02/02/17 0657 02/02/17 0703 02/02/17 1055 02/03/17 0311  WBC 8.7  --  9.5 6.9  NEUTROABS 6.1  --   --  4.4  HGB 13.9 14.3 13.3 13.3  HCT 41.0 42.0 39.6 39.3  MCV 92.8  --  92.5 92.9  PLT 214  --  213 195   Cardiac Enzymes: No results for input(s): CKTOTAL, CKMB, CKMBINDEX, TROPONINI in the last 168 hours. BNP: BNP (last 3 results) No results for input(s): BNP in the last 8760 hours.  ProBNP (last 3 results) No results for input(s): PROBNP in the last 8760 hours.  CBG:  Recent Labs Lab 02/02/17 0804  GLUCAP 165*   Signed:  Penny PiaVEGA, Keiera Strathman MD.  [facility] 02/06/2017, 12:57 PM

## 2017-02-06 NOTE — Progress Notes (Signed)
Pasrr: [phone]  Loyal Bubaadia Jerae Izard LCSWA [phone]

## 2017-02-06 NOTE — Progress Notes (Signed)
Patient will be privately paying to go to [facility].  Osborne Cascoadia Daxson Reffett LCSWA [phone]

## 2017-02-06 NOTE — Care Management Note (Signed)
Case Management Note  Patient Details  Name: Katie Mcfarland MRN: [id number] Date of Birth: Jan 24, 1920  Subjective/Objective:                    Action/Plan: [facility]. No further needs per CM.   Expected Discharge Date:  02/06/17               Expected Discharge Plan:  Skilled Nursing Facility  In-House Referral:  Clinical Social Work  Discharge planning Services     Post Acute Care Choice:    Choice offered to:     DME Arranged:    DME Agency:     HH Arranged:    HH Agency:     Status of Service:  Completed, signed off  If discussed at [employer] Meetings, dates discussed:    Additional Comments:  Kermit BaloKelli F Darene Nappi, RN 02/06/2017, 1:04 PM

## 2017-02-06 NOTE — Consult Note (Signed)
[facility] CM Primary Care Navigator  02/06/2017  Valentine Kuechle Jul 09, 1920 [id number]   Met with patient and daughter in-law Cherylann Ratel) at the bedside to identify possible discharge needs. Daughter in-law reports that patient is a resident of [facility] facility for more than a year. Per daughter in-law, patient was found by staff to have significant weakness of the left arm and confusion that had led to this admission.  Patient is being followed by facility MD and they come to her room per daughter in-law.    Patient uses [facility] facility pharmacy and staff provides and dispenses her medications to her.   According to daughter in-law, staff provides patient's care needs in the facility.   Discharge plan is to skilled nursing facility [facility]) for rehabilitation prior to going back to [facility].  She denies any further needs or concerns of patient at this time.  For additional questions please contact:  Edwena Felty A. Maryelizabeth Eberle, BSN, RN-BC [facility] PRIMARY CARE Navigator Cell: [phone]

## 2017-02-06 NOTE — Progress Notes (Signed)
Physical Therapy Treatment Patient Details Name: Katie RichesMildred Schillinger Mcfarland MRN: [id number] DOB: 16-Aug-1920 Today's Date: 02/06/2017    History of Present Illness Pt is a 83 y.o. female with PMH significant for COPD, dementia, HTN. Presented to ED by EMS on 02/02/17 with new onset of L-side arm weakness. MRI shows acute infarction in the R hemispheric deep white matter adjacent to posterior body of R lateral ventricle. CXR shows L lower lobe subsegmental atelectasis or early pneumonia.     PT Comments    Patient progressing with working on sitting balance and up to [facility], though increased assist noted.  Will need SNF rehab to decrease burden of care prior to d/c home.    Follow Up Recommendations  SNF ([facility])     Equipment Recommendations  None recommended by PT    Recommendations for Other Services       Precautions / Restrictions Precautions Precautions: Fall    Mobility  Bed Mobility Overal bed mobility: Needs Assistance Bed Mobility: Supine to Sit;Sit to Supine Rolling: Max assist Sidelying to sit: Mod assist   Sit to supine: Max assist;+2 for physical assistance   General bed mobility comments: assist for rolling with cues due to pt relates cannot hold rail with L arm so provided hand over hand assist and mod A for coming to upright; to supine assist for legs and trunk  Transfers Overall transfer level: Needs assistance   Transfers: Stand Pivot Transfers     Squat pivot transfers: Max assist;+2 safety/equipment     General transfer comment: pivot to L to [facility] with assist for lifting, turning with cues and [facility] to [facility]  Ambulation/Gait                 Stairs            Wheelchair Mobility    Modified Rankin (Stroke Patients Only) Modified Rankin (Stroke Patients Only) Pre-Morbid Rankin Score: Severe disability Modified Rankin: Severe disability     Balance Overall balance assessment:  Needs assistance Sitting-balance support: Bilateral upper extremity supported;Feet unsupported Sitting balance-Leahy Scale: Poor Sitting balance - Comments: leaning to R and cues for visual orei Postural control: Left lateral lean Standing balance support: Bilateral upper extremity supported Standing balance-Leahy Scale: Zero                      Cognition Arousal/Alertness: Awake/alert Behavior During Therapy: Flat affect Overall Cognitive Status: History of cognitive impairments - at baseline Area of Impairment: Following commands;Awareness;Problem solving     Memory: Decreased short-term memory Following Commands: Follows one step commands consistently     Problem Solving: Difficulty sequencing;Requires verbal cues;Requires tactile cues      Exercises      General Comments General comments (skin integrity, edema, etc.): daughter in room and appreciative of help. requests we speak to pt about importance of participation [facility]      Pertinent Vitals/Pain Pain Assessment: No/denies pain    Home Living                      Prior Function            PT Goals (current goals can now be found in the care plan section) Progress towards PT goals: Progressing toward goals    Frequency    Min 3X/week      PT Plan Discharge plan needs to be updated  Co-evaluation             End of Session Equipment Utilized During Treatment: Gait belt Activity Tolerance: Patient tolerated treatment well Patient left: in bed;with call bell/phone within reach;with family/visitor present;with bed alarm set Nurse Communication: Mobility status PT Visit Diagnosis: Muscle weakness (generalized) (M62.81);Other abnormalities of gait and mobility (R26.89)     Time: 1340-1404 PT Time Calculation (min) (ACUTE ONLY): 24 min  Charges:  $Therapeutic Activity: 23-37 mins                    G Codes:       Elray Mcgregor 03-04-17, 2:32  PM  Sheran Lawless, PT [phone] March 04, 2017

## 2017-02-07 DIAGNOSIS — I10 Essential (primary) hypertension: Secondary | ICD-10-CM | POA: Diagnosis not present

## 2017-02-07 DIAGNOSIS — E785 Hyperlipidemia, unspecified: Secondary | ICD-10-CM | POA: Diagnosis not present

## 2017-02-07 DIAGNOSIS — I639 Cerebral infarction, unspecified: Secondary | ICD-10-CM | POA: Diagnosis not present

## 2017-02-07 DIAGNOSIS — J449 Chronic obstructive pulmonary disease, unspecified: Secondary | ICD-10-CM | POA: Diagnosis not present

## 2017-02-07 LAB — CULTURE, BLOOD (ROUTINE X 2)
CULTURE: NO GROWTH
Culture: NO GROWTH

## 2017-02-08 DIAGNOSIS — R1312 Dysphagia, oropharyngeal phase: Secondary | ICD-10-CM | POA: Diagnosis not present

## 2017-02-08 DIAGNOSIS — M6281 Muscle weakness (generalized): Secondary | ICD-10-CM | POA: Diagnosis not present

## 2017-02-08 DIAGNOSIS — R278 Other lack of coordination: Secondary | ICD-10-CM | POA: Diagnosis not present

## 2017-02-08 DIAGNOSIS — I69354 Hemiplegia and hemiparesis following cerebral infarction affecting left non-dominant side: Secondary | ICD-10-CM | POA: Diagnosis not present

## 2017-02-09 DIAGNOSIS — R1312 Dysphagia, oropharyngeal phase: Secondary | ICD-10-CM | POA: Diagnosis not present

## 2017-02-09 DIAGNOSIS — R278 Other lack of coordination: Secondary | ICD-10-CM | POA: Diagnosis not present

## 2017-02-09 DIAGNOSIS — I69354 Hemiplegia and hemiparesis following cerebral infarction affecting left non-dominant side: Secondary | ICD-10-CM | POA: Diagnosis not present

## 2017-02-09 DIAGNOSIS — M6281 Muscle weakness (generalized): Secondary | ICD-10-CM | POA: Diagnosis not present

## 2017-02-10 DIAGNOSIS — I69354 Hemiplegia and hemiparesis following cerebral infarction affecting left non-dominant side: Secondary | ICD-10-CM | POA: Diagnosis not present

## 2017-02-10 DIAGNOSIS — M6281 Muscle weakness (generalized): Secondary | ICD-10-CM | POA: Diagnosis not present

## 2017-02-10 DIAGNOSIS — R278 Other lack of coordination: Secondary | ICD-10-CM | POA: Diagnosis not present

## 2017-02-10 DIAGNOSIS — R1312 Dysphagia, oropharyngeal phase: Secondary | ICD-10-CM | POA: Diagnosis not present

## 2017-02-11 DIAGNOSIS — Z66 Do not resuscitate: Secondary | ICD-10-CM | POA: Diagnosis not present

## 2017-02-13 DIAGNOSIS — R278 Other lack of coordination: Secondary | ICD-10-CM | POA: Diagnosis not present

## 2017-02-13 DIAGNOSIS — I69354 Hemiplegia and hemiparesis following cerebral infarction affecting left non-dominant side: Secondary | ICD-10-CM | POA: Diagnosis not present

## 2017-02-13 DIAGNOSIS — M6281 Muscle weakness (generalized): Secondary | ICD-10-CM | POA: Diagnosis not present

## 2017-02-13 DIAGNOSIS — R1312 Dysphagia, oropharyngeal phase: Secondary | ICD-10-CM | POA: Diagnosis not present

## 2017-02-14 DIAGNOSIS — M6281 Muscle weakness (generalized): Secondary | ICD-10-CM | POA: Diagnosis not present

## 2017-02-14 DIAGNOSIS — R278 Other lack of coordination: Secondary | ICD-10-CM | POA: Diagnosis not present

## 2017-02-14 DIAGNOSIS — R1312 Dysphagia, oropharyngeal phase: Secondary | ICD-10-CM | POA: Diagnosis not present

## 2017-02-14 DIAGNOSIS — I69354 Hemiplegia and hemiparesis following cerebral infarction affecting left non-dominant side: Secondary | ICD-10-CM | POA: Diagnosis not present

## 2017-02-15 DIAGNOSIS — R1312 Dysphagia, oropharyngeal phase: Secondary | ICD-10-CM | POA: Diagnosis not present

## 2017-02-15 DIAGNOSIS — M6281 Muscle weakness (generalized): Secondary | ICD-10-CM | POA: Diagnosis not present

## 2017-02-15 DIAGNOSIS — R278 Other lack of coordination: Secondary | ICD-10-CM | POA: Diagnosis not present

## 2017-02-15 DIAGNOSIS — I69354 Hemiplegia and hemiparesis following cerebral infarction affecting left non-dominant side: Secondary | ICD-10-CM | POA: Diagnosis not present

## 2017-02-16 DIAGNOSIS — I69354 Hemiplegia and hemiparesis following cerebral infarction affecting left non-dominant side: Secondary | ICD-10-CM | POA: Diagnosis not present

## 2017-02-16 DIAGNOSIS — M6281 Muscle weakness (generalized): Secondary | ICD-10-CM | POA: Diagnosis not present

## 2017-02-16 DIAGNOSIS — R1312 Dysphagia, oropharyngeal phase: Secondary | ICD-10-CM | POA: Diagnosis not present

## 2017-02-16 DIAGNOSIS — R278 Other lack of coordination: Secondary | ICD-10-CM | POA: Diagnosis not present

## 2017-02-17 DIAGNOSIS — I69354 Hemiplegia and hemiparesis following cerebral infarction affecting left non-dominant side: Secondary | ICD-10-CM | POA: Diagnosis not present

## 2017-02-17 DIAGNOSIS — R1312 Dysphagia, oropharyngeal phase: Secondary | ICD-10-CM | POA: Diagnosis not present

## 2017-02-17 DIAGNOSIS — M6281 Muscle weakness (generalized): Secondary | ICD-10-CM | POA: Diagnosis not present

## 2017-02-17 DIAGNOSIS — R278 Other lack of coordination: Secondary | ICD-10-CM | POA: Diagnosis not present

## 2017-02-18 DIAGNOSIS — I69354 Hemiplegia and hemiparesis following cerebral infarction affecting left non-dominant side: Secondary | ICD-10-CM | POA: Diagnosis not present

## 2017-02-18 DIAGNOSIS — R1312 Dysphagia, oropharyngeal phase: Secondary | ICD-10-CM | POA: Diagnosis not present

## 2017-02-18 DIAGNOSIS — M6281 Muscle weakness (generalized): Secondary | ICD-10-CM | POA: Diagnosis not present

## 2017-02-18 DIAGNOSIS — R278 Other lack of coordination: Secondary | ICD-10-CM | POA: Diagnosis not present

## 2017-02-20 DIAGNOSIS — R278 Other lack of coordination: Secondary | ICD-10-CM | POA: Diagnosis not present

## 2017-02-20 DIAGNOSIS — R1312 Dysphagia, oropharyngeal phase: Secondary | ICD-10-CM | POA: Diagnosis not present

## 2017-02-20 DIAGNOSIS — I69354 Hemiplegia and hemiparesis following cerebral infarction affecting left non-dominant side: Secondary | ICD-10-CM | POA: Diagnosis not present

## 2017-02-20 DIAGNOSIS — M6281 Muscle weakness (generalized): Secondary | ICD-10-CM | POA: Diagnosis not present

## 2017-02-21 DIAGNOSIS — M6281 Muscle weakness (generalized): Secondary | ICD-10-CM | POA: Diagnosis not present

## 2017-02-21 DIAGNOSIS — R1312 Dysphagia, oropharyngeal phase: Secondary | ICD-10-CM | POA: Diagnosis not present

## 2017-02-21 DIAGNOSIS — I69354 Hemiplegia and hemiparesis following cerebral infarction affecting left non-dominant side: Secondary | ICD-10-CM | POA: Diagnosis not present

## 2017-02-21 DIAGNOSIS — R278 Other lack of coordination: Secondary | ICD-10-CM | POA: Diagnosis not present

## 2017-02-22 DIAGNOSIS — R278 Other lack of coordination: Secondary | ICD-10-CM | POA: Diagnosis not present

## 2017-02-22 DIAGNOSIS — E1100 Type 2 diabetes mellitus with hyperosmolarity without nonketotic hyperglycemic-hyperosmolar coma (NKHHC): Secondary | ICD-10-CM | POA: Diagnosis not present

## 2017-02-22 DIAGNOSIS — I69354 Hemiplegia and hemiparesis following cerebral infarction affecting left non-dominant side: Secondary | ICD-10-CM | POA: Diagnosis not present

## 2017-02-22 DIAGNOSIS — R1312 Dysphagia, oropharyngeal phase: Secondary | ICD-10-CM | POA: Diagnosis not present

## 2017-02-22 DIAGNOSIS — M6281 Muscle weakness (generalized): Secondary | ICD-10-CM | POA: Diagnosis not present

## 2017-02-22 DIAGNOSIS — I10 Essential (primary) hypertension: Secondary | ICD-10-CM | POA: Diagnosis not present

## 2017-02-23 DIAGNOSIS — I69354 Hemiplegia and hemiparesis following cerebral infarction affecting left non-dominant side: Secondary | ICD-10-CM | POA: Diagnosis not present

## 2017-02-23 DIAGNOSIS — M6281 Muscle weakness (generalized): Secondary | ICD-10-CM | POA: Diagnosis not present

## 2017-02-23 DIAGNOSIS — R1312 Dysphagia, oropharyngeal phase: Secondary | ICD-10-CM | POA: Diagnosis not present

## 2017-02-23 DIAGNOSIS — R278 Other lack of coordination: Secondary | ICD-10-CM | POA: Diagnosis not present

## 2017-02-24 DIAGNOSIS — I69354 Hemiplegia and hemiparesis following cerebral infarction affecting left non-dominant side: Secondary | ICD-10-CM | POA: Diagnosis not present

## 2017-02-24 DIAGNOSIS — M6281 Muscle weakness (generalized): Secondary | ICD-10-CM | POA: Diagnosis not present

## 2017-02-24 DIAGNOSIS — R1312 Dysphagia, oropharyngeal phase: Secondary | ICD-10-CM | POA: Diagnosis not present

## 2017-02-24 DIAGNOSIS — R278 Other lack of coordination: Secondary | ICD-10-CM | POA: Diagnosis not present

## 2017-02-25 DIAGNOSIS — R278 Other lack of coordination: Secondary | ICD-10-CM | POA: Diagnosis not present

## 2017-02-25 DIAGNOSIS — M6281 Muscle weakness (generalized): Secondary | ICD-10-CM | POA: Diagnosis not present

## 2017-02-25 DIAGNOSIS — R1312 Dysphagia, oropharyngeal phase: Secondary | ICD-10-CM | POA: Diagnosis not present

## 2017-02-25 DIAGNOSIS — I69354 Hemiplegia and hemiparesis following cerebral infarction affecting left non-dominant side: Secondary | ICD-10-CM | POA: Diagnosis not present

## 2017-02-26 DIAGNOSIS — I69354 Hemiplegia and hemiparesis following cerebral infarction affecting left non-dominant side: Secondary | ICD-10-CM | POA: Diagnosis not present

## 2017-02-26 DIAGNOSIS — M6281 Muscle weakness (generalized): Secondary | ICD-10-CM | POA: Diagnosis not present

## 2017-02-26 DIAGNOSIS — R278 Other lack of coordination: Secondary | ICD-10-CM | POA: Diagnosis not present

## 2017-02-26 DIAGNOSIS — R1312 Dysphagia, oropharyngeal phase: Secondary | ICD-10-CM | POA: Diagnosis not present

## 2017-02-27 DIAGNOSIS — R278 Other lack of coordination: Secondary | ICD-10-CM | POA: Diagnosis not present

## 2017-02-27 DIAGNOSIS — I69354 Hemiplegia and hemiparesis following cerebral infarction affecting left non-dominant side: Secondary | ICD-10-CM | POA: Diagnosis not present

## 2017-02-27 DIAGNOSIS — M6281 Muscle weakness (generalized): Secondary | ICD-10-CM | POA: Diagnosis not present

## 2017-02-27 DIAGNOSIS — R1312 Dysphagia, oropharyngeal phase: Secondary | ICD-10-CM | POA: Diagnosis not present

## 2017-02-28 DIAGNOSIS — R278 Other lack of coordination: Secondary | ICD-10-CM | POA: Diagnosis not present

## 2017-02-28 DIAGNOSIS — G301 Alzheimer's disease with late onset: Secondary | ICD-10-CM | POA: Diagnosis not present

## 2017-02-28 DIAGNOSIS — I69354 Hemiplegia and hemiparesis following cerebral infarction affecting left non-dominant side: Secondary | ICD-10-CM | POA: Diagnosis not present

## 2017-02-28 DIAGNOSIS — M6281 Muscle weakness (generalized): Secondary | ICD-10-CM | POA: Diagnosis not present

## 2017-02-28 DIAGNOSIS — F5109 Other insomnia not due to a substance or known physiological condition: Secondary | ICD-10-CM | POA: Diagnosis not present

## 2017-02-28 DIAGNOSIS — R1312 Dysphagia, oropharyngeal phase: Secondary | ICD-10-CM | POA: Diagnosis not present

## 2017-03-01 DIAGNOSIS — R278 Other lack of coordination: Secondary | ICD-10-CM | POA: Diagnosis not present

## 2017-03-01 DIAGNOSIS — M6281 Muscle weakness (generalized): Secondary | ICD-10-CM | POA: Diagnosis not present

## 2017-03-01 DIAGNOSIS — I69354 Hemiplegia and hemiparesis following cerebral infarction affecting left non-dominant side: Secondary | ICD-10-CM | POA: Diagnosis not present

## 2017-03-01 DIAGNOSIS — R1312 Dysphagia, oropharyngeal phase: Secondary | ICD-10-CM | POA: Diagnosis not present

## 2017-03-02 DIAGNOSIS — R278 Other lack of coordination: Secondary | ICD-10-CM | POA: Diagnosis not present

## 2017-03-02 DIAGNOSIS — I69354 Hemiplegia and hemiparesis following cerebral infarction affecting left non-dominant side: Secondary | ICD-10-CM | POA: Diagnosis not present

## 2017-03-02 DIAGNOSIS — R1312 Dysphagia, oropharyngeal phase: Secondary | ICD-10-CM | POA: Diagnosis not present

## 2017-03-02 DIAGNOSIS — M6281 Muscle weakness (generalized): Secondary | ICD-10-CM | POA: Diagnosis not present

## 2017-03-03 DIAGNOSIS — R278 Other lack of coordination: Secondary | ICD-10-CM | POA: Diagnosis not present

## 2017-03-03 DIAGNOSIS — R1312 Dysphagia, oropharyngeal phase: Secondary | ICD-10-CM | POA: Diagnosis not present

## 2017-03-03 DIAGNOSIS — M6281 Muscle weakness (generalized): Secondary | ICD-10-CM | POA: Diagnosis not present

## 2017-03-03 DIAGNOSIS — I69354 Hemiplegia and hemiparesis following cerebral infarction affecting left non-dominant side: Secondary | ICD-10-CM | POA: Diagnosis not present

## 2017-03-04 DIAGNOSIS — I69354 Hemiplegia and hemiparesis following cerebral infarction affecting left non-dominant side: Secondary | ICD-10-CM | POA: Diagnosis not present

## 2017-03-04 DIAGNOSIS — R278 Other lack of coordination: Secondary | ICD-10-CM | POA: Diagnosis not present

## 2017-03-04 DIAGNOSIS — R1312 Dysphagia, oropharyngeal phase: Secondary | ICD-10-CM | POA: Diagnosis not present

## 2017-03-04 DIAGNOSIS — M6281 Muscle weakness (generalized): Secondary | ICD-10-CM | POA: Diagnosis not present

## 2017-03-06 DIAGNOSIS — M6281 Muscle weakness (generalized): Secondary | ICD-10-CM | POA: Diagnosis not present

## 2017-03-06 DIAGNOSIS — R1312 Dysphagia, oropharyngeal phase: Secondary | ICD-10-CM | POA: Diagnosis not present

## 2017-03-06 DIAGNOSIS — I69354 Hemiplegia and hemiparesis following cerebral infarction affecting left non-dominant side: Secondary | ICD-10-CM | POA: Diagnosis not present

## 2017-03-06 DIAGNOSIS — R278 Other lack of coordination: Secondary | ICD-10-CM | POA: Diagnosis not present

## 2017-03-07 DIAGNOSIS — I69354 Hemiplegia and hemiparesis following cerebral infarction affecting left non-dominant side: Secondary | ICD-10-CM | POA: Diagnosis not present

## 2017-03-07 DIAGNOSIS — M6281 Muscle weakness (generalized): Secondary | ICD-10-CM | POA: Diagnosis not present

## 2017-03-07 DIAGNOSIS — R278 Other lack of coordination: Secondary | ICD-10-CM | POA: Diagnosis not present

## 2017-03-07 DIAGNOSIS — R319 Hematuria, unspecified: Secondary | ICD-10-CM | POA: Diagnosis not present

## 2017-03-07 DIAGNOSIS — N390 Urinary tract infection, site not specified: Secondary | ICD-10-CM | POA: Diagnosis not present

## 2017-03-07 DIAGNOSIS — R1312 Dysphagia, oropharyngeal phase: Secondary | ICD-10-CM | POA: Diagnosis not present

## 2017-03-08 DIAGNOSIS — R1312 Dysphagia, oropharyngeal phase: Secondary | ICD-10-CM | POA: Diagnosis not present

## 2017-03-08 DIAGNOSIS — R278 Other lack of coordination: Secondary | ICD-10-CM | POA: Diagnosis not present

## 2017-03-08 DIAGNOSIS — I69354 Hemiplegia and hemiparesis following cerebral infarction affecting left non-dominant side: Secondary | ICD-10-CM | POA: Diagnosis not present

## 2017-03-08 DIAGNOSIS — M6281 Muscle weakness (generalized): Secondary | ICD-10-CM | POA: Diagnosis not present

## 2017-03-09 DIAGNOSIS — I69354 Hemiplegia and hemiparesis following cerebral infarction affecting left non-dominant side: Secondary | ICD-10-CM | POA: Diagnosis not present

## 2017-03-09 DIAGNOSIS — R1312 Dysphagia, oropharyngeal phase: Secondary | ICD-10-CM | POA: Diagnosis not present

## 2017-03-09 DIAGNOSIS — R278 Other lack of coordination: Secondary | ICD-10-CM | POA: Diagnosis not present

## 2017-03-09 DIAGNOSIS — M6281 Muscle weakness (generalized): Secondary | ICD-10-CM | POA: Diagnosis not present

## 2017-03-10 DIAGNOSIS — I69354 Hemiplegia and hemiparesis following cerebral infarction affecting left non-dominant side: Secondary | ICD-10-CM | POA: Diagnosis not present

## 2017-03-10 DIAGNOSIS — R1312 Dysphagia, oropharyngeal phase: Secondary | ICD-10-CM | POA: Diagnosis not present

## 2017-03-10 DIAGNOSIS — M6281 Muscle weakness (generalized): Secondary | ICD-10-CM | POA: Diagnosis not present

## 2017-03-10 DIAGNOSIS — R278 Other lack of coordination: Secondary | ICD-10-CM | POA: Diagnosis not present

## 2017-03-11 DIAGNOSIS — I69354 Hemiplegia and hemiparesis following cerebral infarction affecting left non-dominant side: Secondary | ICD-10-CM | POA: Diagnosis not present

## 2017-03-11 DIAGNOSIS — R1312 Dysphagia, oropharyngeal phase: Secondary | ICD-10-CM | POA: Diagnosis not present

## 2017-03-11 DIAGNOSIS — R278 Other lack of coordination: Secondary | ICD-10-CM | POA: Diagnosis not present

## 2017-03-11 DIAGNOSIS — M6281 Muscle weakness (generalized): Secondary | ICD-10-CM | POA: Diagnosis not present

## 2017-03-13 DIAGNOSIS — I69354 Hemiplegia and hemiparesis following cerebral infarction affecting left non-dominant side: Secondary | ICD-10-CM | POA: Diagnosis not present

## 2017-03-13 DIAGNOSIS — R278 Other lack of coordination: Secondary | ICD-10-CM | POA: Diagnosis not present

## 2017-03-13 DIAGNOSIS — M6281 Muscle weakness (generalized): Secondary | ICD-10-CM | POA: Diagnosis not present

## 2017-03-13 DIAGNOSIS — R1312 Dysphagia, oropharyngeal phase: Secondary | ICD-10-CM | POA: Diagnosis not present

## 2017-03-14 DIAGNOSIS — M6281 Muscle weakness (generalized): Secondary | ICD-10-CM | POA: Diagnosis not present

## 2017-03-14 DIAGNOSIS — R1312 Dysphagia, oropharyngeal phase: Secondary | ICD-10-CM | POA: Diagnosis not present

## 2017-03-14 DIAGNOSIS — I69354 Hemiplegia and hemiparesis following cerebral infarction affecting left non-dominant side: Secondary | ICD-10-CM | POA: Diagnosis not present

## 2017-03-14 DIAGNOSIS — R278 Other lack of coordination: Secondary | ICD-10-CM | POA: Diagnosis not present

## 2017-03-15 DIAGNOSIS — R278 Other lack of coordination: Secondary | ICD-10-CM | POA: Diagnosis not present

## 2017-03-15 DIAGNOSIS — M6281 Muscle weakness (generalized): Secondary | ICD-10-CM | POA: Diagnosis not present

## 2017-03-15 DIAGNOSIS — R1312 Dysphagia, oropharyngeal phase: Secondary | ICD-10-CM | POA: Diagnosis not present

## 2017-03-15 DIAGNOSIS — I69354 Hemiplegia and hemiparesis following cerebral infarction affecting left non-dominant side: Secondary | ICD-10-CM | POA: Diagnosis not present

## 2017-03-16 DIAGNOSIS — R1312 Dysphagia, oropharyngeal phase: Secondary | ICD-10-CM | POA: Diagnosis not present

## 2017-03-16 DIAGNOSIS — M6281 Muscle weakness (generalized): Secondary | ICD-10-CM | POA: Diagnosis not present

## 2017-03-16 DIAGNOSIS — R278 Other lack of coordination: Secondary | ICD-10-CM | POA: Diagnosis not present

## 2017-03-16 DIAGNOSIS — I69354 Hemiplegia and hemiparesis following cerebral infarction affecting left non-dominant side: Secondary | ICD-10-CM | POA: Diagnosis not present

## 2017-03-17 DIAGNOSIS — R278 Other lack of coordination: Secondary | ICD-10-CM | POA: Diagnosis not present

## 2017-03-17 DIAGNOSIS — I69354 Hemiplegia and hemiparesis following cerebral infarction affecting left non-dominant side: Secondary | ICD-10-CM | POA: Diagnosis not present

## 2017-03-17 DIAGNOSIS — M6281 Muscle weakness (generalized): Secondary | ICD-10-CM | POA: Diagnosis not present

## 2017-03-17 DIAGNOSIS — R1312 Dysphagia, oropharyngeal phase: Secondary | ICD-10-CM | POA: Diagnosis not present

## 2017-03-18 DIAGNOSIS — I69354 Hemiplegia and hemiparesis following cerebral infarction affecting left non-dominant side: Secondary | ICD-10-CM | POA: Diagnosis not present

## 2017-03-18 DIAGNOSIS — M6281 Muscle weakness (generalized): Secondary | ICD-10-CM | POA: Diagnosis not present

## 2017-03-18 DIAGNOSIS — R278 Other lack of coordination: Secondary | ICD-10-CM | POA: Diagnosis not present

## 2017-03-18 DIAGNOSIS — R1312 Dysphagia, oropharyngeal phase: Secondary | ICD-10-CM | POA: Diagnosis not present

## 2017-03-20 DIAGNOSIS — R278 Other lack of coordination: Secondary | ICD-10-CM | POA: Diagnosis not present

## 2017-03-20 DIAGNOSIS — M6281 Muscle weakness (generalized): Secondary | ICD-10-CM | POA: Diagnosis not present

## 2017-03-20 DIAGNOSIS — I69354 Hemiplegia and hemiparesis following cerebral infarction affecting left non-dominant side: Secondary | ICD-10-CM | POA: Diagnosis not present

## 2017-03-20 DIAGNOSIS — R1312 Dysphagia, oropharyngeal phase: Secondary | ICD-10-CM | POA: Diagnosis not present

## 2017-03-21 DIAGNOSIS — R1312 Dysphagia, oropharyngeal phase: Secondary | ICD-10-CM | POA: Diagnosis not present

## 2017-03-21 DIAGNOSIS — R278 Other lack of coordination: Secondary | ICD-10-CM | POA: Diagnosis not present

## 2017-03-21 DIAGNOSIS — M6281 Muscle weakness (generalized): Secondary | ICD-10-CM | POA: Diagnosis not present

## 2017-03-21 DIAGNOSIS — I69354 Hemiplegia and hemiparesis following cerebral infarction affecting left non-dominant side: Secondary | ICD-10-CM | POA: Diagnosis not present

## 2017-03-22 DIAGNOSIS — M6281 Muscle weakness (generalized): Secondary | ICD-10-CM | POA: Diagnosis not present

## 2017-03-22 DIAGNOSIS — I69354 Hemiplegia and hemiparesis following cerebral infarction affecting left non-dominant side: Secondary | ICD-10-CM | POA: Diagnosis not present

## 2017-03-22 DIAGNOSIS — R1312 Dysphagia, oropharyngeal phase: Secondary | ICD-10-CM | POA: Diagnosis not present

## 2017-03-22 DIAGNOSIS — R278 Other lack of coordination: Secondary | ICD-10-CM | POA: Diagnosis not present

## 2017-03-23 DIAGNOSIS — I10 Essential (primary) hypertension: Secondary | ICD-10-CM | POA: Diagnosis not present

## 2017-03-23 DIAGNOSIS — I69354 Hemiplegia and hemiparesis following cerebral infarction affecting left non-dominant side: Secondary | ICD-10-CM | POA: Diagnosis not present

## 2017-03-23 DIAGNOSIS — R278 Other lack of coordination: Secondary | ICD-10-CM | POA: Diagnosis not present

## 2017-03-23 DIAGNOSIS — J449 Chronic obstructive pulmonary disease, unspecified: Secondary | ICD-10-CM | POA: Diagnosis not present

## 2017-03-27 DIAGNOSIS — J449 Chronic obstructive pulmonary disease, unspecified: Secondary | ICD-10-CM | POA: Diagnosis not present

## 2017-03-27 DIAGNOSIS — R1310 Dysphagia, unspecified: Secondary | ICD-10-CM | POA: Diagnosis not present

## 2017-03-27 DIAGNOSIS — E119 Type 2 diabetes mellitus without complications: Secondary | ICD-10-CM | POA: Diagnosis not present

## 2017-03-27 DIAGNOSIS — I10 Essential (primary) hypertension: Secondary | ICD-10-CM | POA: Diagnosis not present

## 2017-03-27 DIAGNOSIS — I69354 Hemiplegia and hemiparesis following cerebral infarction affecting left non-dominant side: Secondary | ICD-10-CM | POA: Diagnosis not present

## 2017-03-27 DIAGNOSIS — Z993 Dependence on wheelchair: Secondary | ICD-10-CM | POA: Diagnosis not present

## 2017-03-28 DIAGNOSIS — I639 Cerebral infarction, unspecified: Secondary | ICD-10-CM | POA: Diagnosis not present

## 2017-03-28 DIAGNOSIS — I10 Essential (primary) hypertension: Secondary | ICD-10-CM | POA: Diagnosis not present

## 2017-03-28 DIAGNOSIS — R0602 Shortness of breath: Secondary | ICD-10-CM | POA: Diagnosis not present

## 2017-03-28 DIAGNOSIS — E1165 Type 2 diabetes mellitus with hyperglycemia: Secondary | ICD-10-CM | POA: Diagnosis not present

## 2017-03-28 DIAGNOSIS — E782 Mixed hyperlipidemia: Secondary | ICD-10-CM | POA: Diagnosis not present

## 2017-03-29 DIAGNOSIS — I10 Essential (primary) hypertension: Secondary | ICD-10-CM | POA: Diagnosis not present

## 2017-03-29 DIAGNOSIS — J449 Chronic obstructive pulmonary disease, unspecified: Secondary | ICD-10-CM | POA: Diagnosis not present

## 2017-03-29 DIAGNOSIS — I69354 Hemiplegia and hemiparesis following cerebral infarction affecting left non-dominant side: Secondary | ICD-10-CM | POA: Diagnosis not present

## 2017-03-29 DIAGNOSIS — R1310 Dysphagia, unspecified: Secondary | ICD-10-CM | POA: Diagnosis not present

## 2017-03-29 DIAGNOSIS — E119 Type 2 diabetes mellitus without complications: Secondary | ICD-10-CM | POA: Diagnosis not present

## 2017-03-29 DIAGNOSIS — Z993 Dependence on wheelchair: Secondary | ICD-10-CM | POA: Diagnosis not present

## 2017-03-30 DIAGNOSIS — F5109 Other insomnia not due to a substance or known physiological condition: Secondary | ICD-10-CM | POA: Diagnosis not present

## 2017-03-30 DIAGNOSIS — I10 Essential (primary) hypertension: Secondary | ICD-10-CM | POA: Diagnosis not present

## 2017-03-30 DIAGNOSIS — I69354 Hemiplegia and hemiparesis following cerebral infarction affecting left non-dominant side: Secondary | ICD-10-CM | POA: Diagnosis not present

## 2017-03-30 DIAGNOSIS — J449 Chronic obstructive pulmonary disease, unspecified: Secondary | ICD-10-CM | POA: Diagnosis not present

## 2017-03-30 DIAGNOSIS — Z993 Dependence on wheelchair: Secondary | ICD-10-CM | POA: Diagnosis not present

## 2017-03-30 DIAGNOSIS — R1310 Dysphagia, unspecified: Secondary | ICD-10-CM | POA: Diagnosis not present

## 2017-03-30 DIAGNOSIS — G301 Alzheimer's disease with late onset: Secondary | ICD-10-CM | POA: Diagnosis not present

## 2017-03-30 DIAGNOSIS — E119 Type 2 diabetes mellitus without complications: Secondary | ICD-10-CM | POA: Diagnosis not present

## 2017-03-31 DIAGNOSIS — I69354 Hemiplegia and hemiparesis following cerebral infarction affecting left non-dominant side: Secondary | ICD-10-CM | POA: Diagnosis not present

## 2017-03-31 DIAGNOSIS — R1310 Dysphagia, unspecified: Secondary | ICD-10-CM | POA: Diagnosis not present

## 2017-03-31 DIAGNOSIS — J449 Chronic obstructive pulmonary disease, unspecified: Secondary | ICD-10-CM | POA: Diagnosis not present

## 2017-03-31 DIAGNOSIS — E119 Type 2 diabetes mellitus without complications: Secondary | ICD-10-CM | POA: Diagnosis not present

## 2017-03-31 DIAGNOSIS — Z993 Dependence on wheelchair: Secondary | ICD-10-CM | POA: Diagnosis not present

## 2017-03-31 DIAGNOSIS — I10 Essential (primary) hypertension: Secondary | ICD-10-CM | POA: Diagnosis not present

## 2017-04-03 DIAGNOSIS — Z993 Dependence on wheelchair: Secondary | ICD-10-CM | POA: Diagnosis not present

## 2017-04-03 DIAGNOSIS — R1310 Dysphagia, unspecified: Secondary | ICD-10-CM | POA: Diagnosis not present

## 2017-04-03 DIAGNOSIS — R531 Weakness: Secondary | ICD-10-CM | POA: Diagnosis not present

## 2017-04-03 DIAGNOSIS — J449 Chronic obstructive pulmonary disease, unspecified: Secondary | ICD-10-CM | POA: Diagnosis not present

## 2017-04-03 DIAGNOSIS — I69354 Hemiplegia and hemiparesis following cerebral infarction affecting left non-dominant side: Secondary | ICD-10-CM | POA: Diagnosis not present

## 2017-04-03 DIAGNOSIS — I10 Essential (primary) hypertension: Secondary | ICD-10-CM | POA: Diagnosis not present

## 2017-04-03 DIAGNOSIS — E119 Type 2 diabetes mellitus without complications: Secondary | ICD-10-CM | POA: Diagnosis not present

## 2017-04-04 DIAGNOSIS — E119 Type 2 diabetes mellitus without complications: Secondary | ICD-10-CM | POA: Diagnosis not present

## 2017-04-04 DIAGNOSIS — R1310 Dysphagia, unspecified: Secondary | ICD-10-CM | POA: Diagnosis not present

## 2017-04-04 DIAGNOSIS — I69354 Hemiplegia and hemiparesis following cerebral infarction affecting left non-dominant side: Secondary | ICD-10-CM | POA: Diagnosis not present

## 2017-04-04 DIAGNOSIS — J449 Chronic obstructive pulmonary disease, unspecified: Secondary | ICD-10-CM | POA: Diagnosis not present

## 2017-04-04 DIAGNOSIS — G894 Chronic pain syndrome: Secondary | ICD-10-CM | POA: Diagnosis not present

## 2017-04-04 DIAGNOSIS — Z993 Dependence on wheelchair: Secondary | ICD-10-CM | POA: Diagnosis not present

## 2017-04-04 DIAGNOSIS — I10 Essential (primary) hypertension: Secondary | ICD-10-CM | POA: Diagnosis not present

## 2017-04-05 DIAGNOSIS — R1310 Dysphagia, unspecified: Secondary | ICD-10-CM | POA: Diagnosis not present

## 2017-04-05 DIAGNOSIS — I69354 Hemiplegia and hemiparesis following cerebral infarction affecting left non-dominant side: Secondary | ICD-10-CM | POA: Diagnosis not present

## 2017-04-05 DIAGNOSIS — I10 Essential (primary) hypertension: Secondary | ICD-10-CM | POA: Diagnosis not present

## 2017-04-05 DIAGNOSIS — J449 Chronic obstructive pulmonary disease, unspecified: Secondary | ICD-10-CM | POA: Diagnosis not present

## 2017-04-05 DIAGNOSIS — Z993 Dependence on wheelchair: Secondary | ICD-10-CM | POA: Diagnosis not present

## 2017-04-05 DIAGNOSIS — E119 Type 2 diabetes mellitus without complications: Secondary | ICD-10-CM | POA: Diagnosis not present

## 2017-04-06 DIAGNOSIS — I10 Essential (primary) hypertension: Secondary | ICD-10-CM | POA: Diagnosis not present

## 2017-04-06 DIAGNOSIS — I69354 Hemiplegia and hemiparesis following cerebral infarction affecting left non-dominant side: Secondary | ICD-10-CM | POA: Diagnosis not present

## 2017-04-06 DIAGNOSIS — J449 Chronic obstructive pulmonary disease, unspecified: Secondary | ICD-10-CM | POA: Diagnosis not present

## 2017-04-06 DIAGNOSIS — Z993 Dependence on wheelchair: Secondary | ICD-10-CM | POA: Diagnosis not present

## 2017-04-06 DIAGNOSIS — E119 Type 2 diabetes mellitus without complications: Secondary | ICD-10-CM | POA: Diagnosis not present

## 2017-04-06 DIAGNOSIS — R1310 Dysphagia, unspecified: Secondary | ICD-10-CM | POA: Diagnosis not present

## 2017-04-07 DIAGNOSIS — I69354 Hemiplegia and hemiparesis following cerebral infarction affecting left non-dominant side: Secondary | ICD-10-CM | POA: Diagnosis not present

## 2017-04-07 DIAGNOSIS — E119 Type 2 diabetes mellitus without complications: Secondary | ICD-10-CM | POA: Diagnosis not present

## 2017-04-07 DIAGNOSIS — Z993 Dependence on wheelchair: Secondary | ICD-10-CM | POA: Diagnosis not present

## 2017-04-07 DIAGNOSIS — I10 Essential (primary) hypertension: Secondary | ICD-10-CM | POA: Diagnosis not present

## 2017-04-07 DIAGNOSIS — R1310 Dysphagia, unspecified: Secondary | ICD-10-CM | POA: Diagnosis not present

## 2017-04-07 DIAGNOSIS — J449 Chronic obstructive pulmonary disease, unspecified: Secondary | ICD-10-CM | POA: Diagnosis not present

## 2017-04-08 DIAGNOSIS — I10 Essential (primary) hypertension: Secondary | ICD-10-CM | POA: Diagnosis not present

## 2017-04-08 DIAGNOSIS — E119 Type 2 diabetes mellitus without complications: Secondary | ICD-10-CM | POA: Diagnosis not present

## 2017-04-08 DIAGNOSIS — Z993 Dependence on wheelchair: Secondary | ICD-10-CM | POA: Diagnosis not present

## 2017-04-08 DIAGNOSIS — I69354 Hemiplegia and hemiparesis following cerebral infarction affecting left non-dominant side: Secondary | ICD-10-CM | POA: Diagnosis not present

## 2017-04-08 DIAGNOSIS — J449 Chronic obstructive pulmonary disease, unspecified: Secondary | ICD-10-CM | POA: Diagnosis not present

## 2017-04-08 DIAGNOSIS — R1310 Dysphagia, unspecified: Secondary | ICD-10-CM | POA: Diagnosis not present

## 2017-04-10 DIAGNOSIS — J449 Chronic obstructive pulmonary disease, unspecified: Secondary | ICD-10-CM | POA: Diagnosis not present

## 2017-04-10 DIAGNOSIS — E119 Type 2 diabetes mellitus without complications: Secondary | ICD-10-CM | POA: Diagnosis not present

## 2017-04-10 DIAGNOSIS — I10 Essential (primary) hypertension: Secondary | ICD-10-CM | POA: Diagnosis not present

## 2017-04-10 DIAGNOSIS — R1310 Dysphagia, unspecified: Secondary | ICD-10-CM | POA: Diagnosis not present

## 2017-04-10 DIAGNOSIS — Z993 Dependence on wheelchair: Secondary | ICD-10-CM | POA: Diagnosis not present

## 2017-04-10 DIAGNOSIS — I69354 Hemiplegia and hemiparesis following cerebral infarction affecting left non-dominant side: Secondary | ICD-10-CM | POA: Diagnosis not present

## 2017-04-11 DIAGNOSIS — J449 Chronic obstructive pulmonary disease, unspecified: Secondary | ICD-10-CM | POA: Diagnosis not present

## 2017-04-11 DIAGNOSIS — I69354 Hemiplegia and hemiparesis following cerebral infarction affecting left non-dominant side: Secondary | ICD-10-CM | POA: Diagnosis not present

## 2017-04-11 DIAGNOSIS — R1310 Dysphagia, unspecified: Secondary | ICD-10-CM | POA: Diagnosis not present

## 2017-04-11 DIAGNOSIS — E119 Type 2 diabetes mellitus without complications: Secondary | ICD-10-CM | POA: Diagnosis not present

## 2017-04-11 DIAGNOSIS — Z993 Dependence on wheelchair: Secondary | ICD-10-CM | POA: Diagnosis not present

## 2017-04-11 DIAGNOSIS — I10 Essential (primary) hypertension: Secondary | ICD-10-CM | POA: Diagnosis not present

## 2017-04-12 DIAGNOSIS — R1310 Dysphagia, unspecified: Secondary | ICD-10-CM | POA: Diagnosis not present

## 2017-04-12 DIAGNOSIS — E119 Type 2 diabetes mellitus without complications: Secondary | ICD-10-CM | POA: Diagnosis not present

## 2017-04-12 DIAGNOSIS — J449 Chronic obstructive pulmonary disease, unspecified: Secondary | ICD-10-CM | POA: Diagnosis not present

## 2017-04-12 DIAGNOSIS — I69354 Hemiplegia and hemiparesis following cerebral infarction affecting left non-dominant side: Secondary | ICD-10-CM | POA: Diagnosis not present

## 2017-04-12 DIAGNOSIS — Z993 Dependence on wheelchair: Secondary | ICD-10-CM | POA: Diagnosis not present

## 2017-04-12 DIAGNOSIS — I10 Essential (primary) hypertension: Secondary | ICD-10-CM | POA: Diagnosis not present

## 2017-04-13 DIAGNOSIS — R1310 Dysphagia, unspecified: Secondary | ICD-10-CM | POA: Diagnosis not present

## 2017-04-13 DIAGNOSIS — E119 Type 2 diabetes mellitus without complications: Secondary | ICD-10-CM | POA: Diagnosis not present

## 2017-04-13 DIAGNOSIS — Z993 Dependence on wheelchair: Secondary | ICD-10-CM | POA: Diagnosis not present

## 2017-04-13 DIAGNOSIS — J449 Chronic obstructive pulmonary disease, unspecified: Secondary | ICD-10-CM | POA: Diagnosis not present

## 2017-04-13 DIAGNOSIS — I69354 Hemiplegia and hemiparesis following cerebral infarction affecting left non-dominant side: Secondary | ICD-10-CM | POA: Diagnosis not present

## 2017-04-13 DIAGNOSIS — I10 Essential (primary) hypertension: Secondary | ICD-10-CM | POA: Diagnosis not present

## 2017-04-14 DIAGNOSIS — J449 Chronic obstructive pulmonary disease, unspecified: Secondary | ICD-10-CM | POA: Diagnosis not present

## 2017-04-14 DIAGNOSIS — E119 Type 2 diabetes mellitus without complications: Secondary | ICD-10-CM | POA: Diagnosis not present

## 2017-04-14 DIAGNOSIS — I10 Essential (primary) hypertension: Secondary | ICD-10-CM | POA: Diagnosis not present

## 2017-04-14 DIAGNOSIS — I69354 Hemiplegia and hemiparesis following cerebral infarction affecting left non-dominant side: Secondary | ICD-10-CM | POA: Diagnosis not present

## 2017-04-14 DIAGNOSIS — R1310 Dysphagia, unspecified: Secondary | ICD-10-CM | POA: Diagnosis not present

## 2017-04-14 DIAGNOSIS — Z993 Dependence on wheelchair: Secondary | ICD-10-CM | POA: Diagnosis not present

## 2017-04-17 ENCOUNTER — Encounter (INDEPENDENT_AMBULATORY_CARE_PROVIDER_SITE_OTHER): Payer: Self-pay

## 2017-04-17 ENCOUNTER — Encounter: Payer: Self-pay | Admitting: Nurse Practitioner

## 2017-04-17 ENCOUNTER — Ambulatory Visit (INDEPENDENT_AMBULATORY_CARE_PROVIDER_SITE_OTHER): Payer: Medicare Other | Admitting: Nurse Practitioner

## 2017-04-17 VITALS — BP 162/85 | HR 74 | Resp 20 | Wt 167.0 lb

## 2017-04-17 DIAGNOSIS — Z993 Dependence on wheelchair: Secondary | ICD-10-CM | POA: Diagnosis not present

## 2017-04-17 DIAGNOSIS — I69354 Hemiplegia and hemiparesis following cerebral infarction affecting left non-dominant side: Secondary | ICD-10-CM | POA: Diagnosis not present

## 2017-04-17 DIAGNOSIS — E785 Hyperlipidemia, unspecified: Secondary | ICD-10-CM

## 2017-04-17 DIAGNOSIS — I639 Cerebral infarction, unspecified: Secondary | ICD-10-CM

## 2017-04-17 DIAGNOSIS — E119 Type 2 diabetes mellitus without complications: Secondary | ICD-10-CM | POA: Diagnosis not present

## 2017-04-17 DIAGNOSIS — R1310 Dysphagia, unspecified: Secondary | ICD-10-CM | POA: Diagnosis not present

## 2017-04-17 DIAGNOSIS — I10 Essential (primary) hypertension: Secondary | ICD-10-CM

## 2017-04-17 DIAGNOSIS — J449 Chronic obstructive pulmonary disease, unspecified: Secondary | ICD-10-CM | POA: Diagnosis not present

## 2017-04-17 NOTE — Progress Notes (Signed)
I reviewed above note and agree with the assessment and plan.  Marvel PlanJindong Everli Rother, MD PhD Stroke Neurology 04/17/2017 6:01 PM

## 2017-04-17 NOTE — Patient Instructions (Signed)
Per nursing home sheet 

## 2017-04-17 NOTE — Progress Notes (Signed)
[facility]  PATIENT: Katie Mcfarland DOB: 03/07/1920   REASON FOR VISIT: Hospital follow-up for stroke HISTORY FROM: Patient, daughter and caregiver    HISTORY OF PRESENT ILLNESS:Katie Schillinger Savageis an 83 y.o.femalewho presents from her SNF with acute onset of left sided weakness, arm worse than leg. She has a diagnosis of dementia but was described as being fully oriented and not confused per EMS. She herself denies a history of memory problems. LKW at 0230 02/02/2017. Staff noted her to be weak at 6:15 AM. Patient was not administered IV t-PA secondary to delay in arrival. MRI of the brain right CR small infarct secondary to small vessel disease. MRA unremarkable. Carotid Doppler unremarkable. 2-D echo 65-70%. LDL 177. Hemoglobin A1c 7.7. No antithrombotic prior to admission .She was placed on aspirin 325 daily and she returns to the stroke clinic for follow-up today. She remains on aspirin 325 for secondary stroke prevention. She has not had further stroke or TIA symptoms. She has minimal bruising and no  Bleeding.  In addition she is on Lipitor for hyperlipidemia without complaints of myalgias. She is currently receiving physical therapy and occupational therapy several times a week. She is residing at [address] which is an assisted living. She has been there for over a year. She continues to have left-sided weakness and she requires 2+ to transfer from the wheelchair. She returns for reevaluation    REVIEW OF SYSTEMS: Full 14 system review of systems performed and notable only for those listed, all others are neg:  Constitutional: Fatigue  Cardiovascular: neg Ear/Nose/Throat: neg  Skin: neg Eyes: neg Respiratory: Cough wheezing Gastroitestinal: neg  Hematology/Lymphatic: neg  Endocrine: neg Musculoskeletal: Joint pain Allergy/Immunology: neg Neurological: Weakness, memory loss Psychiatric: Depression Sleep : neg   ALLERGIES: No  Known Allergies  HOME MEDICATIONS: Outpatient Medications Prior to Visit  Medication Sig Dispense Refill  . albuterol (PROAIR HFA) 108 (90 Base) MCG/ACT inhaler Inhale 2 puffs into the lungs every 6 (six) hours as needed for wheezing or shortness of breath.    Marland Kitchen aspirin EC 325 MG EC tablet Take 1 tablet (325 mg total) by mouth daily. 30 tablet 0  . atorvastatin (LIPITOR) 40 MG tablet Take 1 tablet (40 mg total) by mouth daily at 6 PM. 30 tablet 0  . cholecalciferol (VITAMIN D) 1000 units tablet Take 2,000 Units by mouth daily.    . Ipratropium-Albuterol (COMBIVENT RESPIMAT) 20-100 MCG/ACT AERS respimat Inhale 1 puff into the lungs 4 (four) times daily.    Marland Kitchen latanoprost (XALATAN) 0.005 % ophthalmic solution Place 1 drop into both eyes at bedtime.     Marland Kitchen loperamide (IMODIUM A-D) 2 MG tablet Take 2 mg by mouth daily as needed for diarrhea or loose stools.    Marland Kitchen LORazepam (ATIVAN) 0.5 MG tablet Take 0.25 mg by mouth every 6 (six) hours as needed for anxiety.    . Multiple Vitamin (DAILY VITE) TABS Take 1 tablet by mouth daily.    . polycarbophil (FIBERCON) 625 MG tablet Take 625 mg by mouth 2 (two) times daily.    . polyvinyl alcohol (ARTIFICIAL TEARS) 1.4 % ophthalmic solution Place 2 drops into both eyes 4 (four) times daily.    . benzonatate (TESSALON) 100 MG capsule Take 1 capsule (100 mg total) by mouth 3 (three) times daily as needed for cough. (Patient not taking: Reported on 04/17/2017) 20 capsule 0  . omeprazole (PRILOSEC) 40 MG capsule Take 40 mg by mouth daily.    Marland Kitchen PRESCRIPTION  MEDICATION Apply 1 application topically See admin instructions. Apply "Lidocaine Plus Cream" to right knee twice daily     No facility-administered medications prior to visit.     PAST MEDICAL HISTORY: Past Medical History:  Diagnosis Date  . Congestive heart failure (CHF) (HCC)   . COPD (chronic obstructive pulmonary disease) (HCC)   . Dementia   . High cholesterol   . Hypertension   . Stroke (HCC)   .  Vision abnormalities     PAST SURGICAL HISTORY: Past Surgical History:  Procedure Laterality Date  . CHOLECYSTECTOMY      FAMILY HISTORY: Family History  Problem Relation Age of Onset  . Stroke Mother   . Diabetes Mother   . Tuberculosis Father     SOCIAL HISTORY: Social History   Social History  . Marital status: Single    Spouse name: N/A  . Number of children: N/A  . Years of education: N/A   Occupational History  . Not on file.   Social History Main Topics  . Smoking status: Never Smoker  . Smokeless tobacco: Never Used  . Alcohol use No  . Drug use: No  . Sexual activity: No   Other Topics Concern  . Not on file   Social History Narrative  . No narrative on file     PHYSICAL EXAM  Vitals:   04/17/17 0936  BP: (!) 162/85  Pulse: 74  Resp: 20  Weight: 167 lb (75.8 kg)   Body mass index is 37.44 kg/m.  Generalized: Well developed, Obese female in no acute distress  Head: normocephalic and atraumatic,. Oropharynx benign  Neck: Supple, no carotid bruits  Cardiac: Regular rate rhythm, no murmur  Musculoskeletal: No deformity   Neurological examination   Mentation: Alert oriented to time, place, history taking. Attention span and concentration appropriate. Recent and remote memory intact.  Follows all commands speech and language fluent.   Cranial nerve II-XII: Pupils were equal round reactive to light extraocular movements were full, visual field were full on confrontational test. Facial sensation and strength were normal. hearing was intact to finger rubbing bilaterally. Uvula tongue midline. head turning and shoulder shrug were normal and symmetric.Tongue protrusion into cheek strength was normal. Motor: normal bulk and tone, full strength in the BUE, BLE, on the right , 3 out of 5 left upper extremity and 4 out of 5 left lower extremity  Sensory: normal and symmetric to light touch, pinprick, and  Vibration, in the upper and lower extremities    Coordination: finger-nose-finger, heel-to-shin bilaterally, no dysmetria Reflexes: 1+ upper lower and symmetric plantar responses were flexor bilaterally. Gait and Station: In wheelchair not ambulated  DIAGNOSTIC DATA (LABS, IMAGING, TESTING) - I reviewed patient records, labs, notes, testing and imaging myself where available.  Lab Results  Component Value Date   WBC 6.9 02/03/2017   HGB 13.3 02/03/2017   HCT 39.3 02/03/2017   MCV 92.9 02/03/2017   PLT 195 02/03/2017      Component Value Date/Time   NA 137 02/03/2017 0311   K 3.6 02/03/2017 0311   CL 98 (L) 02/03/2017 0311   CO2 26 02/03/2017 0311   GLUCOSE 153 (H) 02/03/2017 0311   BUN 9 02/03/2017 0311   CREATININE 0.84 02/03/2017 0311   CALCIUM 10.2 02/03/2017 0311   PROT 7.5 02/02/2017 0657   ALBUMIN 3.6 02/02/2017 0657   AST 55 (H) 02/02/2017 0657   ALT 72 (H) 02/02/2017 0657   ALKPHOS 152 (H) 02/02/2017 0657   BILITOT  0.7 02/02/2017 0657   GFRNONAA 58 (L) 02/03/2017 0311   GFRAA >60 02/03/2017 0311   Lab Results  Component Value Date   CHOL 285 (H) 02/03/2017   HDL 58 02/03/2017   LDLCALC 177 (H) 02/03/2017   TRIG 250 (H) 02/03/2017   CHOLHDL 4.9 02/03/2017   Lab Results  Component Value Date   HGBA1C 7.7 (H) 02/03/2017   ASSESSMENT AND PLAN  83 y.o. year old female  has a past medical history of Congestive heart failure (CHF) (HCC); COPD (chronic obstructive pulmonary disease) (HCC); Dementia; High cholesterol; Hypertension; Stroke [facility]); and Vision abnormalities.Here for hospital follow-up for stroke MRI of the brain right CR small infarct secondary to small vessel disease. MRA unremarkable. Carotid Doppler unremarkable. 2-D echo 65-70%. LDL 177. Hemoglobin A1c 7.7. No antithrombotic prior to admission .    PLAN: Stressed the importance of management of risk factors to prevent further stroke Continue aspirin 316for secondary stroke prevention Maintain strict control of hypertension with blood pressure  goal below 130/90, today's reading 162/85 continue antihypertensive medications Cholesterol with LDL cholesterol less than 70, followed by primary care,  most recent 177 continue statin drugs Lipitor Continue physical therapy and occupational therapy and then home exercise program , eat healthy diet with whole grains,  fresh fruits and vegetables Discussed risk for recurrent stroke/ TIA and answered additional questions This was a of  and medical decision making of high complexity with extensive review of history, hospital chart, counseling and answering questions for patient and daughter-in-law Nilda Riggs, [facility], [facility], APRN  [facility] [address], [address] [place], [place] [address] [phone]

## 2017-04-18 DIAGNOSIS — I639 Cerebral infarction, unspecified: Secondary | ICD-10-CM | POA: Diagnosis not present

## 2017-04-18 DIAGNOSIS — I69354 Hemiplegia and hemiparesis following cerebral infarction affecting left non-dominant side: Secondary | ICD-10-CM | POA: Diagnosis not present

## 2017-04-18 DIAGNOSIS — R0602 Shortness of breath: Secondary | ICD-10-CM | POA: Diagnosis not present

## 2017-04-18 DIAGNOSIS — E119 Type 2 diabetes mellitus without complications: Secondary | ICD-10-CM | POA: Diagnosis not present

## 2017-04-18 DIAGNOSIS — J309 Allergic rhinitis, unspecified: Secondary | ICD-10-CM | POA: Diagnosis not present

## 2017-04-18 DIAGNOSIS — R600 Localized edema: Secondary | ICD-10-CM | POA: Diagnosis not present

## 2017-04-18 DIAGNOSIS — I10 Essential (primary) hypertension: Secondary | ICD-10-CM | POA: Diagnosis not present

## 2017-04-18 DIAGNOSIS — R1310 Dysphagia, unspecified: Secondary | ICD-10-CM | POA: Diagnosis not present

## 2017-04-18 DIAGNOSIS — Z993 Dependence on wheelchair: Secondary | ICD-10-CM | POA: Diagnosis not present

## 2017-04-18 DIAGNOSIS — J449 Chronic obstructive pulmonary disease, unspecified: Secondary | ICD-10-CM | POA: Diagnosis not present

## 2017-04-19 DIAGNOSIS — B351 Tinea unguium: Secondary | ICD-10-CM | POA: Diagnosis not present

## 2017-04-19 DIAGNOSIS — Q845 Enlarged and hypertrophic nails: Secondary | ICD-10-CM | POA: Diagnosis not present

## 2017-04-19 DIAGNOSIS — L603 Nail dystrophy: Secondary | ICD-10-CM | POA: Diagnosis not present

## 2017-04-19 DIAGNOSIS — R600 Localized edema: Secondary | ICD-10-CM | POA: Diagnosis not present

## 2017-04-20 DIAGNOSIS — Z993 Dependence on wheelchair: Secondary | ICD-10-CM | POA: Diagnosis not present

## 2017-04-20 DIAGNOSIS — I10 Essential (primary) hypertension: Secondary | ICD-10-CM | POA: Diagnosis not present

## 2017-04-20 DIAGNOSIS — E119 Type 2 diabetes mellitus without complications: Secondary | ICD-10-CM | POA: Diagnosis not present

## 2017-04-20 DIAGNOSIS — I69354 Hemiplegia and hemiparesis following cerebral infarction affecting left non-dominant side: Secondary | ICD-10-CM | POA: Diagnosis not present

## 2017-04-20 DIAGNOSIS — J449 Chronic obstructive pulmonary disease, unspecified: Secondary | ICD-10-CM | POA: Diagnosis not present

## 2017-04-20 DIAGNOSIS — R1310 Dysphagia, unspecified: Secondary | ICD-10-CM | POA: Diagnosis not present

## 2017-04-21 DIAGNOSIS — R1310 Dysphagia, unspecified: Secondary | ICD-10-CM | POA: Diagnosis not present

## 2017-04-21 DIAGNOSIS — I69354 Hemiplegia and hemiparesis following cerebral infarction affecting left non-dominant side: Secondary | ICD-10-CM | POA: Diagnosis not present

## 2017-04-21 DIAGNOSIS — I10 Essential (primary) hypertension: Secondary | ICD-10-CM | POA: Diagnosis not present

## 2017-04-21 DIAGNOSIS — J449 Chronic obstructive pulmonary disease, unspecified: Secondary | ICD-10-CM | POA: Diagnosis not present

## 2017-04-21 DIAGNOSIS — Z993 Dependence on wheelchair: Secondary | ICD-10-CM | POA: Diagnosis not present

## 2017-04-21 DIAGNOSIS — E119 Type 2 diabetes mellitus without complications: Secondary | ICD-10-CM | POA: Diagnosis not present

## 2017-04-24 DIAGNOSIS — E119 Type 2 diabetes mellitus without complications: Secondary | ICD-10-CM | POA: Diagnosis not present

## 2017-04-24 DIAGNOSIS — I69354 Hemiplegia and hemiparesis following cerebral infarction affecting left non-dominant side: Secondary | ICD-10-CM | POA: Diagnosis not present

## 2017-04-24 DIAGNOSIS — Z993 Dependence on wheelchair: Secondary | ICD-10-CM | POA: Diagnosis not present

## 2017-04-24 DIAGNOSIS — R1310 Dysphagia, unspecified: Secondary | ICD-10-CM | POA: Diagnosis not present

## 2017-04-24 DIAGNOSIS — J449 Chronic obstructive pulmonary disease, unspecified: Secondary | ICD-10-CM | POA: Diagnosis not present

## 2017-04-24 DIAGNOSIS — I10 Essential (primary) hypertension: Secondary | ICD-10-CM | POA: Diagnosis not present

## 2017-04-25 DIAGNOSIS — R1310 Dysphagia, unspecified: Secondary | ICD-10-CM | POA: Diagnosis not present

## 2017-04-25 DIAGNOSIS — J449 Chronic obstructive pulmonary disease, unspecified: Secondary | ICD-10-CM | POA: Diagnosis not present

## 2017-04-25 DIAGNOSIS — E119 Type 2 diabetes mellitus without complications: Secondary | ICD-10-CM | POA: Diagnosis not present

## 2017-04-25 DIAGNOSIS — Z993 Dependence on wheelchair: Secondary | ICD-10-CM | POA: Diagnosis not present

## 2017-04-25 DIAGNOSIS — I10 Essential (primary) hypertension: Secondary | ICD-10-CM | POA: Diagnosis not present

## 2017-04-25 DIAGNOSIS — I69354 Hemiplegia and hemiparesis following cerebral infarction affecting left non-dominant side: Secondary | ICD-10-CM | POA: Diagnosis not present

## 2017-04-27 DIAGNOSIS — J449 Chronic obstructive pulmonary disease, unspecified: Secondary | ICD-10-CM | POA: Diagnosis not present

## 2017-04-27 DIAGNOSIS — R1310 Dysphagia, unspecified: Secondary | ICD-10-CM | POA: Diagnosis not present

## 2017-04-27 DIAGNOSIS — I69354 Hemiplegia and hemiparesis following cerebral infarction affecting left non-dominant side: Secondary | ICD-10-CM | POA: Diagnosis not present

## 2017-04-27 DIAGNOSIS — E119 Type 2 diabetes mellitus without complications: Secondary | ICD-10-CM | POA: Diagnosis not present

## 2017-04-27 DIAGNOSIS — Z993 Dependence on wheelchair: Secondary | ICD-10-CM | POA: Diagnosis not present

## 2017-04-27 DIAGNOSIS — I10 Essential (primary) hypertension: Secondary | ICD-10-CM | POA: Diagnosis not present

## 2017-04-28 DIAGNOSIS — Z993 Dependence on wheelchair: Secondary | ICD-10-CM | POA: Diagnosis not present

## 2017-04-28 DIAGNOSIS — R1310 Dysphagia, unspecified: Secondary | ICD-10-CM | POA: Diagnosis not present

## 2017-04-28 DIAGNOSIS — E119 Type 2 diabetes mellitus without complications: Secondary | ICD-10-CM | POA: Diagnosis not present

## 2017-04-28 DIAGNOSIS — J449 Chronic obstructive pulmonary disease, unspecified: Secondary | ICD-10-CM | POA: Diagnosis not present

## 2017-04-28 DIAGNOSIS — E559 Vitamin D deficiency, unspecified: Secondary | ICD-10-CM | POA: Diagnosis not present

## 2017-04-28 DIAGNOSIS — E039 Hypothyroidism, unspecified: Secondary | ICD-10-CM | POA: Diagnosis not present

## 2017-04-28 DIAGNOSIS — I10 Essential (primary) hypertension: Secondary | ICD-10-CM | POA: Diagnosis not present

## 2017-04-28 DIAGNOSIS — I69354 Hemiplegia and hemiparesis following cerebral infarction affecting left non-dominant side: Secondary | ICD-10-CM | POA: Diagnosis not present

## 2017-05-02 DIAGNOSIS — K5900 Constipation, unspecified: Secondary | ICD-10-CM | POA: Diagnosis not present

## 2017-05-02 DIAGNOSIS — R1310 Dysphagia, unspecified: Secondary | ICD-10-CM | POA: Diagnosis not present

## 2017-05-02 DIAGNOSIS — Z79899 Other long term (current) drug therapy: Secondary | ICD-10-CM | POA: Diagnosis not present

## 2017-05-02 DIAGNOSIS — E119 Type 2 diabetes mellitus without complications: Secondary | ICD-10-CM | POA: Diagnosis not present

## 2017-05-02 DIAGNOSIS — Z993 Dependence on wheelchair: Secondary | ICD-10-CM | POA: Diagnosis not present

## 2017-05-02 DIAGNOSIS — I69354 Hemiplegia and hemiparesis following cerebral infarction affecting left non-dominant side: Secondary | ICD-10-CM | POA: Diagnosis not present

## 2017-05-02 DIAGNOSIS — I10 Essential (primary) hypertension: Secondary | ICD-10-CM | POA: Diagnosis not present

## 2017-05-02 DIAGNOSIS — G894 Chronic pain syndrome: Secondary | ICD-10-CM | POA: Diagnosis not present

## 2017-05-02 DIAGNOSIS — J449 Chronic obstructive pulmonary disease, unspecified: Secondary | ICD-10-CM | POA: Diagnosis not present

## 2017-05-02 DIAGNOSIS — F5109 Other insomnia not due to a substance or known physiological condition: Secondary | ICD-10-CM | POA: Diagnosis not present

## 2017-05-02 DIAGNOSIS — G301 Alzheimer's disease with late onset: Secondary | ICD-10-CM | POA: Diagnosis not present

## 2017-05-04 DIAGNOSIS — I69354 Hemiplegia and hemiparesis following cerebral infarction affecting left non-dominant side: Secondary | ICD-10-CM | POA: Diagnosis not present

## 2017-05-04 DIAGNOSIS — E119 Type 2 diabetes mellitus without complications: Secondary | ICD-10-CM | POA: Diagnosis not present

## 2017-05-04 DIAGNOSIS — J449 Chronic obstructive pulmonary disease, unspecified: Secondary | ICD-10-CM | POA: Diagnosis not present

## 2017-05-04 DIAGNOSIS — Z993 Dependence on wheelchair: Secondary | ICD-10-CM | POA: Diagnosis not present

## 2017-05-04 DIAGNOSIS — R1310 Dysphagia, unspecified: Secondary | ICD-10-CM | POA: Diagnosis not present

## 2017-05-04 DIAGNOSIS — I10 Essential (primary) hypertension: Secondary | ICD-10-CM | POA: Diagnosis not present

## 2017-05-05 DIAGNOSIS — I10 Essential (primary) hypertension: Secondary | ICD-10-CM | POA: Diagnosis not present

## 2017-05-05 DIAGNOSIS — J449 Chronic obstructive pulmonary disease, unspecified: Secondary | ICD-10-CM | POA: Diagnosis not present

## 2017-05-05 DIAGNOSIS — E119 Type 2 diabetes mellitus without complications: Secondary | ICD-10-CM | POA: Diagnosis not present

## 2017-05-05 DIAGNOSIS — R1310 Dysphagia, unspecified: Secondary | ICD-10-CM | POA: Diagnosis not present

## 2017-05-05 DIAGNOSIS — I69354 Hemiplegia and hemiparesis following cerebral infarction affecting left non-dominant side: Secondary | ICD-10-CM | POA: Diagnosis not present

## 2017-05-05 DIAGNOSIS — Z993 Dependence on wheelchair: Secondary | ICD-10-CM | POA: Diagnosis not present

## 2017-05-08 DIAGNOSIS — R1310 Dysphagia, unspecified: Secondary | ICD-10-CM | POA: Diagnosis not present

## 2017-05-08 DIAGNOSIS — I10 Essential (primary) hypertension: Secondary | ICD-10-CM | POA: Diagnosis not present

## 2017-05-08 DIAGNOSIS — I69354 Hemiplegia and hemiparesis following cerebral infarction affecting left non-dominant side: Secondary | ICD-10-CM | POA: Diagnosis not present

## 2017-05-08 DIAGNOSIS — Z993 Dependence on wheelchair: Secondary | ICD-10-CM | POA: Diagnosis not present

## 2017-05-08 DIAGNOSIS — E119 Type 2 diabetes mellitus without complications: Secondary | ICD-10-CM | POA: Diagnosis not present

## 2017-05-08 DIAGNOSIS — J449 Chronic obstructive pulmonary disease, unspecified: Secondary | ICD-10-CM | POA: Diagnosis not present

## 2017-05-10 DIAGNOSIS — Z993 Dependence on wheelchair: Secondary | ICD-10-CM | POA: Diagnosis not present

## 2017-05-10 DIAGNOSIS — I10 Essential (primary) hypertension: Secondary | ICD-10-CM | POA: Diagnosis not present

## 2017-05-10 DIAGNOSIS — J449 Chronic obstructive pulmonary disease, unspecified: Secondary | ICD-10-CM | POA: Diagnosis not present

## 2017-05-10 DIAGNOSIS — E119 Type 2 diabetes mellitus without complications: Secondary | ICD-10-CM | POA: Diagnosis not present

## 2017-05-10 DIAGNOSIS — I69354 Hemiplegia and hemiparesis following cerebral infarction affecting left non-dominant side: Secondary | ICD-10-CM | POA: Diagnosis not present

## 2017-05-10 DIAGNOSIS — R1310 Dysphagia, unspecified: Secondary | ICD-10-CM | POA: Diagnosis not present

## 2017-05-12 DIAGNOSIS — G301 Alzheimer's disease with late onset: Secondary | ICD-10-CM | POA: Diagnosis not present

## 2017-05-16 DIAGNOSIS — E1165 Type 2 diabetes mellitus with hyperglycemia: Secondary | ICD-10-CM | POA: Diagnosis not present

## 2017-05-16 DIAGNOSIS — I639 Cerebral infarction, unspecified: Secondary | ICD-10-CM | POA: Diagnosis not present

## 2017-05-16 DIAGNOSIS — R600 Localized edema: Secondary | ICD-10-CM | POA: Diagnosis not present

## 2017-05-16 DIAGNOSIS — J309 Allergic rhinitis, unspecified: Secondary | ICD-10-CM | POA: Diagnosis not present

## 2017-05-23 DIAGNOSIS — G894 Chronic pain syndrome: Secondary | ICD-10-CM | POA: Diagnosis not present

## 2017-05-31 DIAGNOSIS — G301 Alzheimer's disease with late onset: Secondary | ICD-10-CM | POA: Diagnosis not present

## 2017-05-31 DIAGNOSIS — F5109 Other insomnia not due to a substance or known physiological condition: Secondary | ICD-10-CM | POA: Diagnosis not present

## 2017-06-13 DIAGNOSIS — R600 Localized edema: Secondary | ICD-10-CM | POA: Diagnosis not present

## 2017-06-13 DIAGNOSIS — I10 Essential (primary) hypertension: Secondary | ICD-10-CM | POA: Diagnosis not present

## 2017-06-13 DIAGNOSIS — E782 Mixed hyperlipidemia: Secondary | ICD-10-CM | POA: Diagnosis not present

## 2017-06-13 DIAGNOSIS — E559 Vitamin D deficiency, unspecified: Secondary | ICD-10-CM | POA: Diagnosis not present

## 2017-06-20 DIAGNOSIS — G894 Chronic pain syndrome: Secondary | ICD-10-CM | POA: Diagnosis not present

## 2017-06-29 DIAGNOSIS — R531 Weakness: Secondary | ICD-10-CM | POA: Diagnosis not present

## 2017-07-01 DIAGNOSIS — R05 Cough: Secondary | ICD-10-CM | POA: Diagnosis not present

## 2017-07-03 DIAGNOSIS — F5109 Other insomnia not due to a substance or known physiological condition: Secondary | ICD-10-CM | POA: Diagnosis not present

## 2017-07-03 DIAGNOSIS — G301 Alzheimer's disease with late onset: Secondary | ICD-10-CM | POA: Diagnosis not present

## 2017-07-04 DIAGNOSIS — R05 Cough: Secondary | ICD-10-CM | POA: Diagnosis not present

## 2017-07-11 DIAGNOSIS — R05 Cough: Secondary | ICD-10-CM | POA: Diagnosis not present

## 2017-07-11 DIAGNOSIS — R600 Localized edema: Secondary | ICD-10-CM | POA: Diagnosis not present

## 2017-07-11 DIAGNOSIS — I639 Cerebral infarction, unspecified: Secondary | ICD-10-CM | POA: Diagnosis not present

## 2017-07-11 DIAGNOSIS — E782 Mixed hyperlipidemia: Secondary | ICD-10-CM | POA: Diagnosis not present

## 2017-07-11 DIAGNOSIS — I10 Essential (primary) hypertension: Secondary | ICD-10-CM | POA: Diagnosis not present

## 2017-07-18 DIAGNOSIS — H6120 Impacted cerumen, unspecified ear: Secondary | ICD-10-CM | POA: Diagnosis not present

## 2017-07-18 DIAGNOSIS — G894 Chronic pain syndrome: Secondary | ICD-10-CM | POA: Diagnosis not present

## 2017-07-18 DIAGNOSIS — Z79899 Other long term (current) drug therapy: Secondary | ICD-10-CM | POA: Diagnosis not present

## 2017-08-02 DIAGNOSIS — G301 Alzheimer's disease with late onset: Secondary | ICD-10-CM | POA: Diagnosis not present

## 2017-08-02 DIAGNOSIS — F5109 Other insomnia not due to a substance or known physiological condition: Secondary | ICD-10-CM | POA: Diagnosis not present

## 2017-08-04 DIAGNOSIS — I10 Essential (primary) hypertension: Secondary | ICD-10-CM | POA: Diagnosis not present

## 2017-08-04 DIAGNOSIS — E039 Hypothyroidism, unspecified: Secondary | ICD-10-CM | POA: Diagnosis not present

## 2017-08-04 DIAGNOSIS — E782 Mixed hyperlipidemia: Secondary | ICD-10-CM | POA: Diagnosis not present

## 2017-08-04 DIAGNOSIS — E119 Type 2 diabetes mellitus without complications: Secondary | ICD-10-CM | POA: Diagnosis not present

## 2017-08-08 DIAGNOSIS — E782 Mixed hyperlipidemia: Secondary | ICD-10-CM | POA: Diagnosis not present

## 2017-08-08 DIAGNOSIS — E1165 Type 2 diabetes mellitus with hyperglycemia: Secondary | ICD-10-CM | POA: Diagnosis not present

## 2017-08-08 DIAGNOSIS — I639 Cerebral infarction, unspecified: Secondary | ICD-10-CM | POA: Diagnosis not present

## 2017-08-08 DIAGNOSIS — R05 Cough: Secondary | ICD-10-CM | POA: Diagnosis not present

## 2017-08-08 DIAGNOSIS — I10 Essential (primary) hypertension: Secondary | ICD-10-CM | POA: Diagnosis not present

## 2017-08-15 DIAGNOSIS — G894 Chronic pain syndrome: Secondary | ICD-10-CM | POA: Diagnosis not present

## 2017-08-17 DIAGNOSIS — I10 Essential (primary) hypertension: Secondary | ICD-10-CM | POA: Diagnosis not present

## 2017-08-17 DIAGNOSIS — R05 Cough: Secondary | ICD-10-CM | POA: Diagnosis not present

## 2017-08-17 DIAGNOSIS — E782 Mixed hyperlipidemia: Secondary | ICD-10-CM | POA: Diagnosis not present

## 2017-08-17 DIAGNOSIS — I639 Cerebral infarction, unspecified: Secondary | ICD-10-CM | POA: Diagnosis not present

## 2017-08-17 DIAGNOSIS — E1165 Type 2 diabetes mellitus with hyperglycemia: Secondary | ICD-10-CM | POA: Diagnosis not present

## 2017-08-24 DIAGNOSIS — R531 Weakness: Secondary | ICD-10-CM | POA: Diagnosis not present

## 2017-08-30 DIAGNOSIS — G301 Alzheimer's disease with late onset: Secondary | ICD-10-CM | POA: Diagnosis not present

## 2017-08-30 DIAGNOSIS — F5109 Other insomnia not due to a substance or known physiological condition: Secondary | ICD-10-CM | POA: Diagnosis not present

## 2017-09-05 DIAGNOSIS — E559 Vitamin D deficiency, unspecified: Secondary | ICD-10-CM | POA: Diagnosis not present

## 2017-09-05 DIAGNOSIS — E1165 Type 2 diabetes mellitus with hyperglycemia: Secondary | ICD-10-CM | POA: Diagnosis not present

## 2017-09-05 DIAGNOSIS — Z79899 Other long term (current) drug therapy: Secondary | ICD-10-CM | POA: Diagnosis not present

## 2017-09-05 DIAGNOSIS — M6281 Muscle weakness (generalized): Secondary | ICD-10-CM | POA: Diagnosis not present

## 2017-09-05 DIAGNOSIS — E782 Mixed hyperlipidemia: Secondary | ICD-10-CM | POA: Diagnosis not present

## 2017-09-05 DIAGNOSIS — I639 Cerebral infarction, unspecified: Secondary | ICD-10-CM | POA: Diagnosis not present

## 2017-09-07 DIAGNOSIS — J449 Chronic obstructive pulmonary disease, unspecified: Secondary | ICD-10-CM | POA: Diagnosis not present

## 2017-09-07 DIAGNOSIS — E119 Type 2 diabetes mellitus without complications: Secondary | ICD-10-CM | POA: Diagnosis not present

## 2017-09-07 DIAGNOSIS — I10 Essential (primary) hypertension: Secondary | ICD-10-CM | POA: Diagnosis not present

## 2017-09-07 DIAGNOSIS — I69354 Hemiplegia and hemiparesis following cerebral infarction affecting left non-dominant side: Secondary | ICD-10-CM | POA: Diagnosis not present

## 2017-09-14 DIAGNOSIS — G894 Chronic pain syndrome: Secondary | ICD-10-CM | POA: Diagnosis not present

## 2017-09-21 DIAGNOSIS — R531 Weakness: Secondary | ICD-10-CM | POA: Diagnosis not present

## 2017-10-02 DIAGNOSIS — F5109 Other insomnia not due to a substance or known physiological condition: Secondary | ICD-10-CM | POA: Diagnosis not present

## 2017-10-02 DIAGNOSIS — G301 Alzheimer's disease with late onset: Secondary | ICD-10-CM | POA: Diagnosis not present

## 2017-10-03 DIAGNOSIS — E559 Vitamin D deficiency, unspecified: Secondary | ICD-10-CM | POA: Diagnosis not present

## 2017-10-03 DIAGNOSIS — R600 Localized edema: Secondary | ICD-10-CM | POA: Diagnosis not present

## 2017-10-03 DIAGNOSIS — I639 Cerebral infarction, unspecified: Secondary | ICD-10-CM | POA: Diagnosis not present

## 2017-10-03 DIAGNOSIS — I10 Essential (primary) hypertension: Secondary | ICD-10-CM | POA: Diagnosis not present

## 2017-10-03 DIAGNOSIS — G894 Chronic pain syndrome: Secondary | ICD-10-CM | POA: Diagnosis not present

## 2017-10-04 DIAGNOSIS — R531 Weakness: Secondary | ICD-10-CM | POA: Diagnosis not present

## 2017-10-10 DIAGNOSIS — G894 Chronic pain syndrome: Secondary | ICD-10-CM | POA: Diagnosis not present

## 2017-10-18 ENCOUNTER — Ambulatory Visit: Payer: Medicare Other | Admitting: Nurse Practitioner

## 2017-10-20 DIAGNOSIS — R062 Wheezing: Secondary | ICD-10-CM | POA: Diagnosis not present

## 2017-10-20 DIAGNOSIS — J449 Chronic obstructive pulmonary disease, unspecified: Secondary | ICD-10-CM | POA: Diagnosis not present

## 2017-10-24 ENCOUNTER — Ambulatory Visit: Payer: Medicare Other | Admitting: Nurse Practitioner

## 2017-10-24 DIAGNOSIS — R05 Cough: Secondary | ICD-10-CM | POA: Diagnosis not present

## 2017-10-25 DIAGNOSIS — B351 Tinea unguium: Secondary | ICD-10-CM | POA: Diagnosis not present

## 2017-10-25 DIAGNOSIS — L603 Nail dystrophy: Secondary | ICD-10-CM | POA: Diagnosis not present

## 2017-10-25 DIAGNOSIS — R600 Localized edema: Secondary | ICD-10-CM | POA: Diagnosis not present

## 2017-10-25 DIAGNOSIS — Q845 Enlarged and hypertrophic nails: Secondary | ICD-10-CM | POA: Diagnosis not present

## 2017-10-31 DIAGNOSIS — R05 Cough: Secondary | ICD-10-CM | POA: Diagnosis not present

## 2017-10-31 DIAGNOSIS — I10 Essential (primary) hypertension: Secondary | ICD-10-CM | POA: Diagnosis not present

## 2017-10-31 DIAGNOSIS — E1165 Type 2 diabetes mellitus with hyperglycemia: Secondary | ICD-10-CM | POA: Diagnosis not present

## 2017-10-31 DIAGNOSIS — R600 Localized edema: Secondary | ICD-10-CM | POA: Diagnosis not present

## 2017-10-31 DIAGNOSIS — E782 Mixed hyperlipidemia: Secondary | ICD-10-CM | POA: Diagnosis not present

## 2017-11-02 DIAGNOSIS — G301 Alzheimer's disease with late onset: Secondary | ICD-10-CM | POA: Diagnosis not present

## 2017-11-02 DIAGNOSIS — F5109 Other insomnia not due to a substance or known physiological condition: Secondary | ICD-10-CM | POA: Diagnosis not present

## 2017-11-07 DIAGNOSIS — Z79899 Other long term (current) drug therapy: Secondary | ICD-10-CM | POA: Diagnosis not present

## 2017-11-07 DIAGNOSIS — G894 Chronic pain syndrome: Secondary | ICD-10-CM | POA: Diagnosis not present

## 2017-11-19 DIAGNOSIS — R062 Wheezing: Secondary | ICD-10-CM | POA: Diagnosis not present

## 2017-11-19 DIAGNOSIS — J449 Chronic obstructive pulmonary disease, unspecified: Secondary | ICD-10-CM | POA: Diagnosis not present

## 2017-11-21 DIAGNOSIS — R05 Cough: Secondary | ICD-10-CM | POA: Diagnosis not present

## 2017-11-21 DIAGNOSIS — H6120 Impacted cerumen, unspecified ear: Secondary | ICD-10-CM | POA: Diagnosis not present

## 2017-11-21 DIAGNOSIS — I10 Essential (primary) hypertension: Secondary | ICD-10-CM | POA: Diagnosis not present

## 2017-11-21 DIAGNOSIS — E1165 Type 2 diabetes mellitus with hyperglycemia: Secondary | ICD-10-CM | POA: Diagnosis not present

## 2017-11-21 DIAGNOSIS — R600 Localized edema: Secondary | ICD-10-CM | POA: Diagnosis not present

## 2017-11-22 DIAGNOSIS — E782 Mixed hyperlipidemia: Secondary | ICD-10-CM | POA: Diagnosis not present

## 2017-11-22 DIAGNOSIS — D518 Other vitamin B12 deficiency anemias: Secondary | ICD-10-CM | POA: Diagnosis not present

## 2017-11-22 DIAGNOSIS — E119 Type 2 diabetes mellitus without complications: Secondary | ICD-10-CM | POA: Diagnosis not present

## 2017-11-22 DIAGNOSIS — I10 Essential (primary) hypertension: Secondary | ICD-10-CM | POA: Diagnosis not present

## 2017-11-22 DIAGNOSIS — E039 Hypothyroidism, unspecified: Secondary | ICD-10-CM | POA: Diagnosis not present

## 2017-11-22 DIAGNOSIS — E559 Vitamin D deficiency, unspecified: Secondary | ICD-10-CM | POA: Diagnosis not present

## 2017-11-23 DIAGNOSIS — R531 Weakness: Secondary | ICD-10-CM | POA: Diagnosis not present

## 2017-11-29 DIAGNOSIS — G301 Alzheimer's disease with late onset: Secondary | ICD-10-CM | POA: Diagnosis not present

## 2017-11-29 DIAGNOSIS — F5109 Other insomnia not due to a substance or known physiological condition: Secondary | ICD-10-CM | POA: Diagnosis not present

## 2017-12-06 DIAGNOSIS — G894 Chronic pain syndrome: Secondary | ICD-10-CM | POA: Diagnosis not present

## 2017-12-20 DIAGNOSIS — R062 Wheezing: Secondary | ICD-10-CM | POA: Diagnosis not present

## 2017-12-20 DIAGNOSIS — J449 Chronic obstructive pulmonary disease, unspecified: Secondary | ICD-10-CM | POA: Diagnosis not present

## 2017-12-26 DIAGNOSIS — Z0000 Encounter for general adult medical examination without abnormal findings: Secondary | ICD-10-CM | POA: Diagnosis not present

## 2018-01-02 DIAGNOSIS — G894 Chronic pain syndrome: Secondary | ICD-10-CM | POA: Diagnosis not present

## 2018-01-02 DIAGNOSIS — R0981 Nasal congestion: Secondary | ICD-10-CM | POA: Diagnosis not present

## 2018-01-02 DIAGNOSIS — R05 Cough: Secondary | ICD-10-CM | POA: Diagnosis not present

## 2018-01-04 DIAGNOSIS — F5109 Other insomnia not due to a substance or known physiological condition: Secondary | ICD-10-CM | POA: Diagnosis not present

## 2018-01-04 DIAGNOSIS — G301 Alzheimer's disease with late onset: Secondary | ICD-10-CM | POA: Diagnosis not present

## 2018-01-08 DIAGNOSIS — H9209 Otalgia, unspecified ear: Secondary | ICD-10-CM | POA: Diagnosis not present

## 2018-01-08 DIAGNOSIS — H903 Sensorineural hearing loss, bilateral: Secondary | ICD-10-CM | POA: Diagnosis not present

## 2018-01-20 DIAGNOSIS — R062 Wheezing: Secondary | ICD-10-CM | POA: Diagnosis not present

## 2018-01-20 DIAGNOSIS — J449 Chronic obstructive pulmonary disease, unspecified: Secondary | ICD-10-CM | POA: Diagnosis not present

## 2018-01-29 DIAGNOSIS — L603 Nail dystrophy: Secondary | ICD-10-CM | POA: Diagnosis not present

## 2018-01-29 DIAGNOSIS — B351 Tinea unguium: Secondary | ICD-10-CM | POA: Diagnosis not present

## 2018-01-29 DIAGNOSIS — Q845 Enlarged and hypertrophic nails: Secondary | ICD-10-CM | POA: Diagnosis not present

## 2018-01-29 DIAGNOSIS — Z79899 Other long term (current) drug therapy: Secondary | ICD-10-CM | POA: Diagnosis not present

## 2018-01-30 DIAGNOSIS — R1110 Vomiting, unspecified: Secondary | ICD-10-CM | POA: Diagnosis not present

## 2018-01-30 DIAGNOSIS — H6120 Impacted cerumen, unspecified ear: Secondary | ICD-10-CM | POA: Diagnosis not present

## 2018-01-30 DIAGNOSIS — E1165 Type 2 diabetes mellitus with hyperglycemia: Secondary | ICD-10-CM | POA: Diagnosis not present

## 2018-01-30 DIAGNOSIS — E559 Vitamin D deficiency, unspecified: Secondary | ICD-10-CM | POA: Diagnosis not present

## 2018-02-01 DIAGNOSIS — F5109 Other insomnia not due to a substance or known physiological condition: Secondary | ICD-10-CM | POA: Diagnosis not present

## 2018-02-01 DIAGNOSIS — G301 Alzheimer's disease with late onset: Secondary | ICD-10-CM | POA: Diagnosis not present

## 2018-02-17 DIAGNOSIS — R062 Wheezing: Secondary | ICD-10-CM | POA: Diagnosis not present

## 2018-02-17 DIAGNOSIS — J449 Chronic obstructive pulmonary disease, unspecified: Secondary | ICD-10-CM | POA: Diagnosis not present

## 2018-02-20 DIAGNOSIS — I10 Essential (primary) hypertension: Secondary | ICD-10-CM | POA: Diagnosis not present

## 2018-02-20 DIAGNOSIS — R1110 Vomiting, unspecified: Secondary | ICD-10-CM | POA: Diagnosis not present

## 2018-02-20 DIAGNOSIS — R600 Localized edema: Secondary | ICD-10-CM | POA: Diagnosis not present

## 2018-02-20 DIAGNOSIS — E1165 Type 2 diabetes mellitus with hyperglycemia: Secondary | ICD-10-CM | POA: Diagnosis not present

## 2018-02-27 DIAGNOSIS — G894 Chronic pain syndrome: Secondary | ICD-10-CM | POA: Diagnosis not present

## 2018-03-02 DIAGNOSIS — G301 Alzheimer's disease with late onset: Secondary | ICD-10-CM | POA: Diagnosis not present

## 2018-03-02 DIAGNOSIS — F5109 Other insomnia not due to a substance or known physiological condition: Secondary | ICD-10-CM | POA: Diagnosis not present

## 2018-03-20 DIAGNOSIS — R062 Wheezing: Secondary | ICD-10-CM | POA: Diagnosis not present

## 2018-03-20 DIAGNOSIS — G301 Alzheimer's disease with late onset: Secondary | ICD-10-CM | POA: Diagnosis not present

## 2018-03-20 DIAGNOSIS — J449 Chronic obstructive pulmonary disease, unspecified: Secondary | ICD-10-CM | POA: Diagnosis not present

## 2018-03-27 DIAGNOSIS — G894 Chronic pain syndrome: Secondary | ICD-10-CM | POA: Diagnosis not present

## 2018-04-03 DIAGNOSIS — I10 Essential (primary) hypertension: Secondary | ICD-10-CM | POA: Diagnosis not present

## 2018-04-03 DIAGNOSIS — B379 Candidiasis, unspecified: Secondary | ICD-10-CM | POA: Diagnosis not present

## 2018-04-03 DIAGNOSIS — R600 Localized edema: Secondary | ICD-10-CM | POA: Diagnosis not present

## 2018-04-03 DIAGNOSIS — Z79899 Other long term (current) drug therapy: Secondary | ICD-10-CM | POA: Diagnosis not present

## 2018-04-03 DIAGNOSIS — E1165 Type 2 diabetes mellitus with hyperglycemia: Secondary | ICD-10-CM | POA: Diagnosis not present

## 2018-04-03 DIAGNOSIS — H6120 Impacted cerumen, unspecified ear: Secondary | ICD-10-CM | POA: Diagnosis not present

## 2018-04-10 DIAGNOSIS — L853 Xerosis cutis: Secondary | ICD-10-CM | POA: Diagnosis not present

## 2018-04-10 DIAGNOSIS — M2010 Hallux valgus (acquired), unspecified foot: Secondary | ICD-10-CM | POA: Diagnosis not present

## 2018-04-10 DIAGNOSIS — I739 Peripheral vascular disease, unspecified: Secondary | ICD-10-CM | POA: Diagnosis not present

## 2018-04-10 DIAGNOSIS — L603 Nail dystrophy: Secondary | ICD-10-CM | POA: Diagnosis not present

## 2018-04-10 DIAGNOSIS — B351 Tinea unguium: Secondary | ICD-10-CM | POA: Diagnosis not present

## 2018-04-16 DIAGNOSIS — E782 Mixed hyperlipidemia: Secondary | ICD-10-CM | POA: Diagnosis not present

## 2018-04-16 DIAGNOSIS — E1165 Type 2 diabetes mellitus with hyperglycemia: Secondary | ICD-10-CM | POA: Diagnosis not present

## 2018-04-16 DIAGNOSIS — H6120 Impacted cerumen, unspecified ear: Secondary | ICD-10-CM | POA: Diagnosis not present

## 2018-04-16 DIAGNOSIS — I10 Essential (primary) hypertension: Secondary | ICD-10-CM | POA: Diagnosis not present

## 2018-04-16 DIAGNOSIS — B379 Candidiasis, unspecified: Secondary | ICD-10-CM | POA: Diagnosis not present

## 2018-04-17 DIAGNOSIS — R0602 Shortness of breath: Secondary | ICD-10-CM | POA: Diagnosis not present

## 2018-04-17 DIAGNOSIS — E1165 Type 2 diabetes mellitus with hyperglycemia: Secondary | ICD-10-CM | POA: Diagnosis not present

## 2018-04-17 DIAGNOSIS — B379 Candidiasis, unspecified: Secondary | ICD-10-CM | POA: Diagnosis not present

## 2018-04-17 DIAGNOSIS — I639 Cerebral infarction, unspecified: Secondary | ICD-10-CM | POA: Diagnosis not present

## 2018-04-17 DIAGNOSIS — K219 Gastro-esophageal reflux disease without esophagitis: Secondary | ICD-10-CM | POA: Diagnosis not present

## 2018-04-19 DIAGNOSIS — R062 Wheezing: Secondary | ICD-10-CM | POA: Diagnosis not present

## 2018-04-19 DIAGNOSIS — J449 Chronic obstructive pulmonary disease, unspecified: Secondary | ICD-10-CM | POA: Diagnosis not present

## 2018-05-03 DIAGNOSIS — Z8673 Personal history of transient ischemic attack (TIA), and cerebral infarction without residual deficits: Secondary | ICD-10-CM | POA: Diagnosis not present

## 2018-05-08 DIAGNOSIS — G894 Chronic pain syndrome: Secondary | ICD-10-CM | POA: Diagnosis not present

## 2018-05-08 DIAGNOSIS — G301 Alzheimer's disease with late onset: Secondary | ICD-10-CM | POA: Diagnosis not present

## 2018-05-10 ENCOUNTER — Emergency Department (HOSPITAL_COMMUNITY): Payer: Medicare Other

## 2018-05-10 ENCOUNTER — Other Ambulatory Visit: Payer: Self-pay

## 2018-05-10 ENCOUNTER — Observation Stay (HOSPITAL_COMMUNITY)
Admission: EM | Admit: 2018-05-10 | Discharge: 2018-05-12 | Payer: Medicare Other | Attending: Internal Medicine | Admitting: Internal Medicine

## 2018-05-10 ENCOUNTER — Encounter (HOSPITAL_COMMUNITY): Payer: Self-pay | Admitting: Emergency Medicine

## 2018-05-10 DIAGNOSIS — Z7982 Long term (current) use of aspirin: Secondary | ICD-10-CM | POA: Insufficient documentation

## 2018-05-10 DIAGNOSIS — F0391 Unspecified dementia with behavioral disturbance: Secondary | ICD-10-CM | POA: Insufficient documentation

## 2018-05-10 DIAGNOSIS — E872 Acidosis, unspecified: Secondary | ICD-10-CM

## 2018-05-10 DIAGNOSIS — T17908S Unspecified foreign body in respiratory tract, part unspecified causing other injury, sequela: Secondary | ICD-10-CM | POA: Diagnosis not present

## 2018-05-10 DIAGNOSIS — R0602 Shortness of breath: Secondary | ICD-10-CM | POA: Diagnosis not present

## 2018-05-10 DIAGNOSIS — I110 Hypertensive heart disease with heart failure: Secondary | ICD-10-CM | POA: Diagnosis not present

## 2018-05-10 DIAGNOSIS — I639 Cerebral infarction, unspecified: Secondary | ICD-10-CM

## 2018-05-10 DIAGNOSIS — J690 Pneumonitis due to inhalation of food and vomit: Secondary | ICD-10-CM | POA: Diagnosis not present

## 2018-05-10 DIAGNOSIS — Z8673 Personal history of transient ischemic attack (TIA), and cerebral infarction without residual deficits: Secondary | ICD-10-CM | POA: Diagnosis not present

## 2018-05-10 DIAGNOSIS — E785 Hyperlipidemia, unspecified: Secondary | ICD-10-CM | POA: Diagnosis present

## 2018-05-10 DIAGNOSIS — Z79891 Long term (current) use of opiate analgesic: Secondary | ICD-10-CM | POA: Diagnosis not present

## 2018-05-10 DIAGNOSIS — Z794 Long term (current) use of insulin: Secondary | ICD-10-CM | POA: Insufficient documentation

## 2018-05-10 DIAGNOSIS — I10 Essential (primary) hypertension: Secondary | ICD-10-CM | POA: Diagnosis present

## 2018-05-10 DIAGNOSIS — R42 Dizziness and giddiness: Secondary | ICD-10-CM | POA: Diagnosis not present

## 2018-05-10 DIAGNOSIS — E7800 Pure hypercholesterolemia, unspecified: Secondary | ICD-10-CM | POA: Diagnosis not present

## 2018-05-10 DIAGNOSIS — R9431 Abnormal electrocardiogram [ECG] [EKG]: Secondary | ICD-10-CM | POA: Diagnosis not present

## 2018-05-10 DIAGNOSIS — R1110 Vomiting, unspecified: Secondary | ICD-10-CM | POA: Diagnosis not present

## 2018-05-10 DIAGNOSIS — E1165 Type 2 diabetes mellitus with hyperglycemia: Secondary | ICD-10-CM | POA: Diagnosis not present

## 2018-05-10 DIAGNOSIS — I5032 Chronic diastolic (congestive) heart failure: Secondary | ICD-10-CM | POA: Insufficient documentation

## 2018-05-10 DIAGNOSIS — J189 Pneumonia, unspecified organism: Principal | ICD-10-CM | POA: Diagnosis present

## 2018-05-10 DIAGNOSIS — F03918 Unspecified dementia, unspecified severity, with other behavioral disturbance: Secondary | ICD-10-CM | POA: Diagnosis present

## 2018-05-10 DIAGNOSIS — J440 Chronic obstructive pulmonary disease with acute lower respiratory infection: Secondary | ICD-10-CM | POA: Diagnosis not present

## 2018-05-10 DIAGNOSIS — Z66 Do not resuscitate: Secondary | ICD-10-CM | POA: Diagnosis not present

## 2018-05-10 DIAGNOSIS — T17908A Unspecified foreign body in respiratory tract, part unspecified causing other injury, initial encounter: Secondary | ICD-10-CM

## 2018-05-10 DIAGNOSIS — R05 Cough: Secondary | ICD-10-CM | POA: Diagnosis not present

## 2018-05-10 DIAGNOSIS — Z79899 Other long term (current) drug therapy: Secondary | ICD-10-CM | POA: Insufficient documentation

## 2018-05-10 LAB — I-STAT TROPONIN, ED: TROPONIN I, POC: 0.01 ng/mL (ref 0.00–0.08)

## 2018-05-10 LAB — CBC WITH DIFFERENTIAL/PLATELET
BASOS ABS: 0 10*3/uL (ref 0.0–0.1)
BASOS PCT: 0 %
Eosinophils Absolute: 0.1 10*3/uL (ref 0.0–0.7)
Eosinophils Relative: 2 %
HEMATOCRIT: 43.6 % (ref 36.0–46.0)
HEMOGLOBIN: 15.1 g/dL — AB (ref 12.0–15.0)
Lymphocytes Relative: 30 %
Lymphs Abs: 2.1 10*3/uL (ref 0.7–4.0)
MCH: 32.1 pg (ref 26.0–34.0)
MCHC: 34.6 g/dL (ref 30.0–36.0)
MCV: 92.6 fL (ref 78.0–100.0)
Monocytes Absolute: 0.5 10*3/uL (ref 0.1–1.0)
Monocytes Relative: 8 %
NEUTROS ABS: 4.2 10*3/uL (ref 1.7–7.7)
NEUTROS PCT: 60 %
Platelets: 193 10*3/uL (ref 150–400)
RBC: 4.71 MIL/uL (ref 3.87–5.11)
RDW: 13.2 % (ref 11.5–15.5)
WBC: 7 10*3/uL (ref 4.0–10.5)

## 2018-05-10 LAB — I-STAT CG4 LACTIC ACID, ED: Lactic Acid, Venous: 2.05 mmol/L (ref 0.5–1.9)

## 2018-05-10 LAB — MRSA PCR SCREENING: MRSA by PCR: NEGATIVE

## 2018-05-10 LAB — GLUCOSE, CAPILLARY: Glucose-Capillary: 290 mg/dL — ABNORMAL HIGH (ref 65–99)

## 2018-05-10 LAB — COMPREHENSIVE METABOLIC PANEL
ALK PHOS: 140 U/L — AB (ref 38–126)
ALT: 49 U/L (ref 14–54)
ANION GAP: 12 (ref 5–15)
AST: 51 U/L — ABNORMAL HIGH (ref 15–41)
Albumin: 3.7 g/dL (ref 3.5–5.0)
BILIRUBIN TOTAL: 0.8 mg/dL (ref 0.3–1.2)
BUN: 14 mg/dL (ref 6–20)
CALCIUM: 10.1 mg/dL (ref 8.9–10.3)
CO2: 31 mmol/L (ref 22–32)
Chloride: 94 mmol/L — ABNORMAL LOW (ref 101–111)
Creatinine, Ser: 0.81 mg/dL (ref 0.44–1.00)
GFR calc non Af Amer: 60 mL/min — ABNORMAL LOW (ref >60)
Glucose, Bld: 264 mg/dL — ABNORMAL HIGH (ref 65–99)
POTASSIUM: 3.6 mmol/L (ref 3.5–5.1)
Sodium: 137 mmol/L (ref 135–145)
TOTAL PROTEIN: 7 g/dL (ref 6.5–8.1)

## 2018-05-10 LAB — CBG MONITORING, ED: Glucose-Capillary: 161 mg/dL — ABNORMAL HIGH (ref 65–99)

## 2018-05-10 LAB — LACTIC ACID, PLASMA: Lactic Acid, Venous: 2 mmol/L (ref 0.5–1.9)

## 2018-05-10 LAB — LIPASE, BLOOD: LIPASE: 20 U/L (ref 11–51)

## 2018-05-10 MED ORDER — IPRATROPIUM-ALBUTEROL 0.5-2.5 (3) MG/3ML IN SOLN
3.0000 mL | Freq: Four times a day (QID) | RESPIRATORY_TRACT | Status: DC
  Administered 2018-05-10: 3 mL via RESPIRATORY_TRACT
  Filled 2018-05-10: qty 3

## 2018-05-10 MED ORDER — CHLORHEXIDINE GLUCONATE 0.12 % MT SOLN
15.0000 mL | Freq: Two times a day (BID) | OROMUCOSAL | Status: DC
  Administered 2018-05-10 – 2018-05-12 (×4): 15 mL via OROMUCOSAL
  Filled 2018-05-10 (×4): qty 15

## 2018-05-10 MED ORDER — VITAMIN D3 25 MCG (1000 UNIT) PO TABS
2000.0000 [IU] | ORAL_TABLET | Freq: Every day | ORAL | Status: DC
  Administered 2018-05-11 – 2018-05-12 (×2): 2000 [IU] via ORAL
  Filled 2018-05-10 (×2): qty 2

## 2018-05-10 MED ORDER — SODIUM CHLORIDE 0.9 % IV SOLN: INTRAVENOUS | Status: DC

## 2018-05-10 MED ORDER — SODIUM CHLORIDE 0.9 % IV BOLUS
1000.0000 mL | Freq: Once | INTRAVENOUS | Status: AC
  Administered 2018-05-10: 1000 mL via INTRAVENOUS

## 2018-05-10 MED ORDER — INSULIN ASPART 100 UNIT/ML ~~LOC~~ SOLN
0.0000 [IU] | Freq: Every day | SUBCUTANEOUS | Status: DC
  Administered 2018-05-10: 3 [IU] via SUBCUTANEOUS
  Administered 2018-05-11: 2 [IU] via SUBCUTANEOUS

## 2018-05-10 MED ORDER — FAMOTIDINE 20 MG PO TABS
20.0000 mg | ORAL_TABLET | Freq: Every day | ORAL | Status: DC
  Administered 2018-05-11 – 2018-05-12 (×2): 20 mg via ORAL
  Filled 2018-05-10 (×2): qty 1

## 2018-05-10 MED ORDER — SODIUM CHLORIDE 0.9 % IV BOLUS
500.0000 mL | Freq: Once | INTRAVENOUS | Status: AC
  Administered 2018-05-10: 500 mL via INTRAVENOUS

## 2018-05-10 MED ORDER — AMOXICILLIN-POT CLAVULANATE 875-125 MG PO TABS: 1.0000 | ORAL_TABLET | Freq: Once | ORAL | Status: DC

## 2018-05-10 MED ORDER — ASPIRIN EC 81 MG PO TBEC
81.0000 mg | DELAYED_RELEASE_TABLET | Freq: Every day | ORAL | Status: DC
  Administered 2018-05-11 – 2018-05-12 (×2): 81 mg via ORAL
  Filled 2018-05-10 (×2): qty 1

## 2018-05-10 MED ORDER — ONDANSETRON HCL 4 MG/2ML IJ SOLN: 4.0000 mg | Freq: Once | INTRAMUSCULAR | Status: DC

## 2018-05-10 MED ORDER — INSULIN ASPART 100 UNIT/ML ~~LOC~~ SOLN
0.0000 [IU] | Freq: Three times a day (TID) | SUBCUTANEOUS | Status: DC
  Administered 2018-05-11: 5 [IU] via SUBCUTANEOUS
  Administered 2018-05-11: 11 [IU] via SUBCUTANEOUS
  Administered 2018-05-11: 5 [IU] via SUBCUTANEOUS
  Administered 2018-05-12: 8 [IU] via SUBCUTANEOUS

## 2018-05-10 MED ORDER — ONDANSETRON HCL 4 MG/2ML IJ SOLN: 4.0000 mg | Freq: Four times a day (QID) | INTRAMUSCULAR | Status: DC | PRN

## 2018-05-10 MED ORDER — AMLODIPINE BESYLATE 5 MG PO TABS
5.0000 mg | ORAL_TABLET | Freq: Every day | ORAL | Status: DC
  Administered 2018-05-11 – 2018-05-12 (×2): 5 mg via ORAL
  Filled 2018-05-10 (×2): qty 1

## 2018-05-10 MED ORDER — PIPERACILLIN-TAZOBACTAM 3.375 G IVPB 30 MIN
3.3750 g | Freq: Once | INTRAVENOUS | Status: AC
  Administered 2018-05-10: 3.375 g via INTRAVENOUS
  Filled 2018-05-10: qty 50

## 2018-05-10 MED ORDER — DOXYCYCLINE HYCLATE 100 MG PO TABS: 100.0000 mg | ORAL_TABLET | Freq: Once | ORAL | Status: DC

## 2018-05-10 MED ORDER — MIRTAZAPINE 7.5 MG PO TABS
7.5000 mg | ORAL_TABLET | Freq: Every day | ORAL | Status: DC
  Administered 2018-05-10 – 2018-05-11 (×2): 7.5 mg via ORAL
  Filled 2018-05-10 (×2): qty 1

## 2018-05-10 MED ORDER — SERTRALINE HCL 50 MG PO TABS
50.0000 mg | ORAL_TABLET | Freq: Every day | ORAL | Status: DC
  Administered 2018-05-11 – 2018-05-12 (×2): 50 mg via ORAL
  Filled 2018-05-10 (×2): qty 1

## 2018-05-10 MED ORDER — IPRATROPIUM-ALBUTEROL 0.5-2.5 (3) MG/3ML IN SOLN
3.0000 mL | Freq: Three times a day (TID) | RESPIRATORY_TRACT | Status: DC
  Administered 2018-05-11 – 2018-05-12 (×3): 3 mL via RESPIRATORY_TRACT
  Filled 2018-05-10 (×4): qty 3

## 2018-05-10 MED ORDER — ORAL CARE MOUTH RINSE: 15.0000 mL | Freq: Two times a day (BID) | OROMUCOSAL | Status: DC

## 2018-05-10 MED ORDER — PIPERACILLIN-TAZOBACTAM 3.375 G IVPB
3.3750 g | Freq: Three times a day (TID) | INTRAVENOUS | Status: DC
  Administered 2018-05-10 – 2018-05-12 (×5): 3.375 g via INTRAVENOUS
  Filled 2018-05-10 (×5): qty 50

## 2018-05-10 MED ORDER — ATORVASTATIN CALCIUM 40 MG PO TABS
40.0000 mg | ORAL_TABLET | Freq: Every day | ORAL | Status: DC
  Administered 2018-05-10 – 2018-05-11 (×2): 40 mg via ORAL
  Filled 2018-05-10 (×2): qty 1

## 2018-05-10 NOTE — ED Notes (Signed)
Bed: WA04 Expected date:  Expected time:  Means of arrival:  Comments: EMS 84 yo

## 2018-05-10 NOTE — ED Triage Notes (Signed)
Per PTAR pt sent from [facility] to r/o aspiration pneumonia r/t emesis yesterday; hx of dementia alert to self and place at times.

## 2018-05-10 NOTE — Progress Notes (Signed)
Pharmacy Antibiotic Note  Imagene RichesMildred Schillinger Pesantez is a 84 y.o. female admitted on 05/10/2018 with aspiration PNA.  Pharmacy has been consulted for piperacillin/tazobactam dosing.  Patient admitted from assisted living facility with complains of dyspnea and family reports of coughing when eating food. Chest X-ray shows patchy consolidation that may represent pneumonia.   Plan: Piperacillin/tazobactam 3.375 g IV q8h EI  Weight: 167 lb 12.8 oz (76.1 kg)  Ht from 2018 = 4'8" Calculated CrCl ~35 mL/min using adjusted body weight  Temp (24hrs), Avg:98.5 F (36.9 C), Min:98.3 F (36.8 C), Max:98.6 F (37 C)  Recent Labs  Lab 05/10/18 1224 05/10/18 1256  WBC 7.0  --   CREATININE 0.81  --   LATICACIDVEN  --  2.05*    CrCl cannot be calculated (Unknown ideal weight.).    No Known Allergies  Antimicrobials this admission: Piperacillin/tazobactam 6/6 >>   Dose adjustments this admission:  Microbiology results: 6/6 BCx: Sent 6/6 MRSA PCR: Sent  Thank you for allowing pharmacy to be a part of this patient's care.  Keturah BarreMary Wheeler Incorvaia, PharmD, BCPS Clinical Pharmacist Pager: [phone] 05/10/18 5:17 PM

## 2018-05-10 NOTE — Progress Notes (Signed)
A consult was received from an ED physician for vancomycin + piperacillin/tazobactam per pharmacy dosing for aspiration PNA.  The patient's profile has been reviewed for ht/wt/allergies/indication/available labs.  NKDA.  Per discussion with MD, have ordered piperacillin/tazobactam 3.375 g IV dose. Also ordered MRSA PCR. Will hold off of vancomycin pending results of MRSA PCR. If negative, no vancomycin. If positive, will order a dose.   Further antibiotics/pharmacy consults should be ordered by admitting physician if indicated.                       Thank you, Cindi CarbonMary M Sundee Garland, PharmD 05/10/2018  2:56 PM

## 2018-05-10 NOTE — Progress Notes (Signed)
CRITICAL VALUE ALERT  Critical Value:  Lactic acid 2.0  Date & Time Notied:  6/6 2257  Provider Notified: Yes, Bodenheimer  Orders Received/Actions taken:

## 2018-05-10 NOTE — ED Notes (Signed)
Blue, Gold and 1st set of cultures sent down with labs.

## 2018-05-10 NOTE — ED Notes (Signed)
ED TO INPATIENT HANDOFF REPORT  Name/Age/Gender Katie Mcfarland 84 y.o. female  Code Status    Code Status Orders  (From admission, onward)        Start     Ordered   05/10/18 1638  Do not attempt resuscitation (DNR)  Continuous    Question Answer Comment  In the event of cardiac or respiratory ARREST Do not call a "code blue"   In the event of cardiac or respiratory ARREST Do not perform Intubation, CPR, defibrillation or ACLS   In the event of cardiac or respiratory ARREST Use medication by any route, position, wound care, and other measures to relive pain and suffering. May use oxygen, suction and manual treatment of airway obstruction as needed for comfort.      05/10/18 1639    Code Status History    Date Active Date Inactive Code Status Order ID Comments User Context   02/02/2017 0915 02/06/2017 2052 DNR [id number]  Brenton Grills, PA-C ED   02/02/2017 0815 02/02/2017 0915 DNR [id number]  Leo Grosser, MD ED   09/18/2015 2146 09/20/2015 2200 DNR [id number]  Sid Falcon, MD Inpatient    Advance Directive Documentation     Most Recent Value  Type of Advance Directive  Healthcare Power of Attorney, Living will  Pre-existing out of facility DNR order (yellow form or pink MOST form)  Yellow form placed in chart (order not valid for inpatient use), Pink MOST form placed in chart (order not valid for inpatient use)  "MOST" Form in Place?  -      Home/SNF/Other Nursing Home  Chief Complaint weakness/ dizzy   Level of Care/Admitting Diagnosis ED Disposition    ED Disposition Condition [place]: [facility] [[id number]]  Level of Care: Med-Surg [16]  Diagnosis: Pneumonia [227785]  Admitting Physician: Shelly Coss [[id number]]  Attending Physician: Shelly Coss [[id number]]  PT Class (Do Not Modify): Observation [104]  PT Acc Code (Do Not Modify): Observation [10022]       Medical History Past Medical History:   Diagnosis Date  . Congestive heart failure (CHF) ([place])   . COPD (chronic obstructive pulmonary disease) ([place])   . Dementia   . High cholesterol   . Hypertension   . Stroke ([place])   . Vision abnormalities     Allergies No Known Allergies  IV Location/Drains/Wounds Patient Lines/Drains/Airways Status   Active Line/Drains/Airways    Name:   Placement date:   Placement time:   Site:   Days:   Peripheral IV 05/10/18 Right;Posterior Forearm   05/10/18    1511    Forearm   less than 1   External Urinary Catheter   05/10/18    1305    -   less than 1          Labs/Imaging Results for orders placed or performed during the hospital encounter of 05/10/18 (from the past 48 hour(s))  Comprehensive metabolic panel     Status: Abnormal   Collection Time: 05/10/18 12:24 PM  Result Value Ref Range   Sodium 137 135 - 145 mmol/L   Potassium 3.6 3.5 - 5.1 mmol/L   Chloride 94 (L) 101 - 111 mmol/L   CO2 31 22 - 32 mmol/L   Glucose, Bld 264 (H) 65 - 99 mg/dL   BUN 14 6 - 20 mg/dL   Creatinine, Ser 0.81 0.44 - 1.00 mg/dL   Calcium 10.1 8.9 - 10.3 mg/dL   Total Protein  7.0 6.5 - 8.1 g/dL   Albumin 3.7 3.5 - 5.0 g/dL   AST 51 (H) 15 - 41 U/L   ALT 49 14 - 54 U/L   Alkaline Phosphatase 140 (H) 38 - 126 U/L   Total Bilirubin 0.8 0.3 - 1.2 mg/dL   GFR calc non Af Amer 60 (L) >60 mL/min   GFR calc Af Amer >60 >60 mL/min    Comment: (NOTE) The eGFR has been calculated using the CKD EPI equation. This calculation has not been validated in all clinical situations. eGFR's persistently <60 mL/min signify possible Chronic Kidney Disease.    Anion gap 12 5 - 15    Comment: Performed at [facility], [place] [address], [place], [place] [address]  CBC with Differential/Platelet     Status: Abnormal   Collection Time: 05/10/18 12:24 PM  Result Value Ref Range   WBC 7.0 4.0 - 10.5 K/uL   RBC 4.71 3.87 - 5.11 MIL/uL   Hemoglobin 15.1 (H) 12.0 - 15.0 g/dL   HCT 43.6 36.0 - 46.0 %    MCV 92.6 78.0 - 100.0 fL   MCH 32.1 26.0 - 34.0 pg   MCHC 34.6 30.0 - 36.0 g/dL   RDW 13.2 11.5 - 15.5 %   Platelets 193 150 - 400 K/uL   Neutrophils Relative % 60 %   Neutro Abs 4.2 1.7 - 7.7 K/uL   Lymphocytes Relative 30 %   Lymphs Abs 2.1 0.7 - 4.0 K/uL   Monocytes Relative 8 %   Monocytes Absolute 0.5 0.1 - 1.0 K/uL   Eosinophils Relative 2 %   Eosinophils Absolute 0.1 0.0 - 0.7 K/uL   Basophils Relative 0 %   Basophils Absolute 0.0 0.0 - 0.1 K/uL    Comment: Performed at [facility], [place] [address], [place], [place] [address]  Lipase, blood     Status: None   Collection Time: 05/10/18 12:50 PM  Result Value Ref Range   Lipase 20 11 - 51 U/L    Comment: Performed at [facility], [place] [address] [place], [place] [address]  I-stat troponin, ED     Status: None   Collection Time: 05/10/18 12:54 PM  Result Value Ref Range   Troponin i, poc 0.01 0.00 - 0.08 ng/mL   Comment 3            Comment: Due to the release kinetics of cTnI, a negative result within the first hours of the onset of symptoms does not rule out myocardial infarction with certainty. If myocardial infarction is still suspected, repeat the test at appropriate intervals.   I-Stat CG4 Lactic Acid, ED     Status: Abnormal   Collection Time: 05/10/18 12:56 PM  Result Value Ref Range   Lactic Acid, Venous 2.05 (HH) 0.5 - 1.9 mmol/L   Comment NOTIFIED PHYSICIAN   CBG monitoring, ED     Status: Abnormal   Collection Time: 05/10/18  5:06 PM  Result Value Ref Range   Glucose-Capillary 161 (H) 65 - 99 mg/dL   Comment 1 Notify RN    Comment 2 Document in Chart    Dg Chest 2 View  Result Date: 05/10/2018 CLINICAL DATA:  Patient with cough and shortness of breath. EXAM: CHEST - 2 VIEW COMPARISON:  Chest radiograph 02/02/2017. FINDINGS: Monitoring leads overlie the patient. Low lung volumes. Stable enlarged cardiac and mediastinal contours. Mild elevation of the right  hemidiaphragm. Lateral view nondiagnostic. Patchy consolidation within the right  mid lung. No pleural effusion or pneumothorax. IMPRESSION: Patchy consolidation within the right mid lung may represent pneumonia in the appropriate clinical setting. Followup PA and lateral chest X-ray is recommended in 3-4 weeks following trial of antibiotic therapy to ensure resolution and exclude underlying malignancy. Electronically Signed   By: Lovey Newcomer M.D.   On: 05/10/2018 13:00    Pending Labs Unresulted Labs (From admission, onward)   Start     Ordered   05/11/18 0500  Lactic acid, plasma  Tomorrow morning,   R     05/10/18 1618   05/11/18 4196  Basic metabolic panel  Tomorrow morning,   R     05/10/18 1639   05/11/18 0500  CBC  Tomorrow morning,   R     05/10/18 1639   05/10/18 2200  Lactic acid, plasma  Once,   R     05/10/18 1618   05/10/18 1631  Culture, blood (routine x 2)  BLOOD CULTURE X 2,   R     05/10/18 1630   05/10/18 1452  MRSA PCR Screening  STAT,   R    Question:  Patient immune status  Answer:  Normal   05/10/18 1451   05/10/18 1212  Urinalysis, Routine w reflex microscopic  Once,   R     05/10/18 1211      Vitals/Pain Today's Vitals   05/10/18 1527 05/10/18 1600 05/10/18 1657 05/10/18 1707  BP:  119/61    Pulse:  (!) 58    Resp:  (!) 25    Temp: 98.6 F (37 C)  98.3 F (36.8 C)   TempSrc:      SpO2:  95%    Weight:    167 lb 12.8 oz (76.1 kg)  PainSc:        Isolation Precautions No active isolations  Medications Medications  ondansetron (ZOFRAN) injection 4 mg (0 mg Intravenous Hold 05/10/18 1538)  insulin aspart (novoLOG) injection 0-15 Units (has no administration in time range)  insulin aspart (novoLOG) injection 0-5 Units (has no administration in time range)  0.9 %  sodium chloride infusion (has no administration in time range)  ondansetron (ZOFRAN) injection 4 mg (has no administration in time range)  amLODipine (NORVASC) tablet 5 mg (has no  administration in time range)  aspirin EC tablet 81 mg (has no administration in time range)  atorvastatin (LIPITOR) tablet 40 mg (has no administration in time range)  cholecalciferol (VITAMIN D) tablet 2,000 Units (has no administration in time range)  mirtazapine (REMERON) tablet 7.5 mg (has no administration in time range)  famotidine (PEPCID) tablet 20 mg (has no administration in time range)  sertraline (ZOLOFT) tablet 50 mg (has no administration in time range)  ipratropium-albuterol (DUONEB) 0.5-2.5 (3) MG/3ML nebulizer solution 3 mL (has no administration in time range)  piperacillin-tazobactam (ZOSYN) IVPB 3.375 g (has no administration in time range)  sodium chloride 0.9 % bolus 1,000 mL (1,000 mLs Intravenous New Bag/Given 05/10/18 1512)  piperacillin-tazobactam (ZOSYN) IVPB 3.375 g (0 g Intravenous Stopped 05/10/18 1617)    Mobility manual wheelchair

## 2018-05-10 NOTE — ED Provider Notes (Signed)
[facility]-EMERGENCY DEPT Provider Note   CSN: [id number] Arrival date & time: 05/10/18  1144     History   Chief Complaint Chief Complaint  Patient presents with  . Emesis    HPI Katie RichesMildred Schillinger Summer is a 84 y.o. female.  HPI   Patient is a 84 year old female presenting for rule out aspiration pneumonia.  She reports that she is been vomiting several times.  Patient has history of dementia but is alert to self and place currently.  Patient has no complaints.  She is denies any abdominal pain.  Denies any vomiting of black material.  Denies any other complaints such as dysuria.  Level 5 caveat dementia.  Past Medical History:  Diagnosis Date  . Congestive heart failure (CHF) ([facility])   . COPD (chronic obstructive pulmonary disease) ([facility])   . Dementia   . High cholesterol   . Hypertension   . Stroke ([facility])   . Vision abnormalities     Patient Active Problem List   Diagnosis Date Noted  . Acute ischemic stroke ([facility]) 02/02/2017  . HLD (hyperlipidemia) 09/19/2015  . HTN (hypertension) 09/19/2015  . Acute encephalopathy 09/19/2015  . Pneumonia 09/18/2015  . Dementia with behavioral disturbance 09/18/2015    Past Surgical History:  Procedure Laterality Date  . CHOLECYSTECTOMY       OB History   None      Home Medications    Prior to Admission medications   Medication Sig Start Date End Date Taking? Authorizing Provider  albuterol (PROAIR HFA) 108 (90 Base) MCG/ACT inhaler Inhale 2 puffs into the lungs every 6 (six) hours as needed for wheezing or shortness of breath.   Yes [provider]  amLODipine (NORVASC) 5 MG tablet Take 5 mg by mouth daily. 04/19/18  Yes [provider]  aspirin EC 81 MG tablet Take 81 mg by mouth daily.   Yes [provider]  atorvastatin (LIPITOR) 40 MG tablet Take 1 tablet (40 mg total) by mouth daily at 6 PM. 02/06/17  Yes Penny PiaVega, [place], MD  cholecalciferol (VITAMIN D) 1000 units  tablet Take 2,000 Units by mouth daily.   Yes [provider]  furosemide (LASIX) 20 MG tablet Take 20 mg by mouth daily.  04/14/17  Yes [provider]  HYDROcodone-acetaminophen (NORCO/VICODIN) 5-325 MG tablet Take 0.5 tablets by mouth 3 (three) times daily.  04/04/17  Yes [provider]  Ipratropium-Albuterol (COMBIVENT RESPIMAT) 20-100 MCG/ACT AERS respimat Inhale 1 puff into the lungs 4 (four) times daily.   Yes [provider]  LANTUS SOLOSTAR 100 UNIT/ML Solostar Pen Inject 15 Units into the skin every morning. 04/18/18  Yes [provider]  latanoprost (XALATAN) 0.005 % ophthalmic solution Place 1 drop into both eyes at bedtime.    Yes [provider]  Melatonin 10 MG TABS Take 10 mg by mouth at bedtime.   Yes [provider]  Mineral Oil Heavy OIL Place 2 drops into both ears every 7 (seven) days. Thursdays   Yes [provider]  mirtazapine (REMERON) 15 MG tablet Take 0.5 tablets by mouth at bedtime. 05/08/18  Yes [provider]  Multiple Vitamin (DAILY VITE) TABS Take 1 tablet by mouth daily.   Yes [provider]  polycarbophil (FIBERCON) 625 MG tablet Take 625 mg by mouth 2 (two) times daily.   Yes [provider]  polyethylene glycol powder (GLYCOLAX/MIRALAX) powder Take 17 g by mouth once.   Yes [provider]  polyvinyl alcohol (  ARTIFICIAL TEARS) 1.4 % ophthalmic solution Place 2 drops into both eyes 4 (four) times daily.   Yes [provider]  ranitidine (ZANTAC) 75 MG tablet Take 75 mg by mouth 2 (two) times daily.   Yes [provider]  senna-docusate (SENOKOT-S) 8.6-50 MG tablet Take 1 tablet by mouth every other day.   Yes [provider]  sertraline (ZOLOFT) 50 MG tablet Take 50 mg by mouth daily. 04/19/18  Yes [provider]  aspirin EC 325 MG EC tablet Take 1 tablet (325 mg total) by mouth daily. Patient taking differently: Take 81 mg by  mouth daily.  02/07/17   Penny Pia, MD  benzonatate (TESSALON) 100 MG capsule Take 1 capsule (100 mg total) by mouth 3 (three) times daily as needed for cough. Patient not taking: Reported on 04/17/2017 02/06/17   Penny Pia, MD    Family History Family History  Problem Relation Age of Onset  . Stroke Mother   . Diabetes Mother   . Tuberculosis Father     Social History Social History   Tobacco Use  . Smoking status: Never Smoker  . Smokeless tobacco: Never Used  Substance Use Topics  . Alcohol use: No  . Drug use: No     Allergies   Patient has no known allergies.   Review of Systems Review of Systems  Unable to perform ROS: Dementia  Constitutional: Negative for activity change.  Respiratory: Negative for shortness of breath.   Cardiovascular: Negative for chest pain.  Gastrointestinal: Negative for abdominal pain.  All other systems reviewed and are negative.    Physical Exam Updated Vital Signs BP (!) 175/71   Pulse (!) 55   Temp 98.6 F (37 C) (Oral)   Resp 19   SpO2 94%   Physical Exam  Constitutional: She appears well-developed and well-nourished.  HENT:  Head: Normocephalic and atraumatic.  Eyes: Right eye exhibits no discharge. Left eye exhibits no discharge.  Cardiovascular: Normal rate and regular rhythm.  Murmur heard. Pulmonary/Chest: Effort normal and breath sounds normal. She has no wheezes. She has no rales.  Abdominal: Soft. She exhibits no distension. There is no tenderness.  Neurological: No cranial nerve deficit.  Skin: Skin is warm and dry. She is not diaphoretic.  Psychiatric: She has a normal mood and affect.  Nursing note and vitals reviewed.    ED Treatments / Results  Labs (all labs ordered are listed, but only abnormal results are displayed) Labs Reviewed  COMPREHENSIVE METABOLIC PANEL - Abnormal; Notable for the following components:      Result Value   Chloride 94 (*)    Glucose, Bld 264 (*)    AST 51 (*)     Alkaline Phosphatase 140 (*)    GFR calc non Af Amer 60 (*)    All other components within normal limits  CBC WITH DIFFERENTIAL/PLATELET - Abnormal; Notable for the following components:   Hemoglobin 15.1 (*)    All other components within normal limits  I-STAT CG4 LACTIC ACID, ED - Abnormal; Notable for the following components:   Lactic Acid, Venous 2.05 (*)    All other components within normal limits  MRSA PCR SCREENING  LIPASE, BLOOD  URINALYSIS, ROUTINE W REFLEX MICROSCOPIC  I-STAT TROPONIN, ED    EKG EKG Interpretation  Date/Time:  Thursday May 10 2018 12:20:45 EDT Ventricular Rate:  61 PR Interval:    QRS Duration: 92 QT Interval:  414 QTC Calculation: 417 R Axis:   -37 Text Interpretation:  Sinus rhythm Left axis deviation Abnormal R-wave progression, late transition Borderline T wave abnormalities Normal sinus rhythm Confirmed by Corlis Leak, Kevon Tench ([id number]) on 05/10/2018 1:53:45 PM   Radiology Dg Chest 2 View  Result Date: 05/10/2018 CLINICAL DATA:  Patient with cough and shortness of breath. EXAM: CHEST - 2 VIEW COMPARISON:  Chest radiograph 02/02/2017. FINDINGS: Monitoring leads overlie the patient. Low lung volumes. Stable enlarged cardiac and mediastinal contours. Mild elevation of the right hemidiaphragm. Lateral view nondiagnostic. Patchy consolidation within the right mid lung. No pleural effusion or pneumothorax. IMPRESSION: Patchy consolidation within the right mid lung may represent pneumonia in the appropriate clinical setting. Followup PA and lateral chest X-ray is recommended in 3-4 weeks following trial of antibiotic therapy to ensure resolution and exclude underlying malignancy. Electronically Signed   By: Annia Belt M.D.   On: 05/10/2018 13:00    Procedures Procedures (including critical care time)  Medications Ordered in ED Medications  sodium chloride 0.9 % bolus 1,000 mL (1,000 mLs Intravenous New Bag/Given 05/10/18 1512)  ondansetron (ZOFRAN)  injection 4 mg (has no administration in time range)  piperacillin-tazobactam (ZOSYN) IVPB 3.375 g (has no administration in time range)     Initial Impression / Assessment and Plan / ED Course  I have reviewed the triage vital signs and the nursing notes.  Pertinent labs & imaging results that were available during my care of the patient were reviewed by me and considered in my medical decision making (see chart for details).     Patient is a 84 year old female presenting for rule out aspiration pneumonia.  She reports that she is been vomiting several times.  Patient has history of dementia but is alert to self and place currently.  Patient has no complaints.  She is denies any abdominal pain.  Denies any vomiting of black material.  Denies any other complaints such as dysuria.  Level 5 caveat dementia.  3:14 PM Will do broad-spectrum work-up including chest x-ray.  Patient is completely reassuring vital signs on exam.  3:14 PM Patient family and caregiver not bedside.  They report that she is much more altered than usual.  Reports confusion, inability to interact with that she usually does.    Patient found to have right middle lobe pneumonia, and significant cannot likely for aspiration pneumonia.  Likely from aspiration from her vomiting.  Patient complains of continued nausea today.  Has difficulty taking p.o. without nausea.  Will initiate IV fluids, antibiotics,.  Will admit for observation  Final Clinical Impressions(s) / ED Diagnoses   Final diagnoses:  None    ED Discharge Orders    None       Aayliah Rotenberry, Cindee Salt, MD 05/10/18 1514

## 2018-05-10 NOTE — ED Notes (Addendum)
Pt unable to ambulate, pt is wheelchair bound and normally does not walk independently. Weakness in both legs noted upon assisting purwick set up.

## 2018-05-10 NOTE — H&P (Signed)
History and Physical    Katie Mcfarland WUJ:[id number] DOB: [birth year] DOA: 05/10/2018  PCP: Merri Brunette, MD   Patient coming from: Assisted living facilty    Chief Complaint: Shortness of breath, vomiting  HPI: Katie Mcfarland is a 84 y.o. female with medical history significant of hypertension, hyperlipidemia, stroke, dementia who presents from the assisted living facility with complaints of dyspnea.  Patient was reported to have been vomiting and also had loose stools for last few days.  Recently she has been noticed to be coughing while eating food.  Patient has history of dementia.  Her mental status is on baseline.  She is oriented to place and person and answers the questions.  History was obtained with the help of granddaughter on the bedside. Patient was dehydrated, weak and also confused at assisted living facility.  She was coughing while taking food.  Granddaughter did not report any fever or chills.  Patient has recent history of stroke and is currently bedbound and ambulates with the help of wheelchair. X-ray done in the emergency department was suggestive of consolidation on the right medial lobe. Patient seen and examined the bedside in the emergency department.  During my evaluation, she looked comfortable.  She was hemodynamically stable.  She was saturating fine on room air and was without any distress.  Granddaughter notified that she coughed every time she is given food.  Diarrhea and vomiting has stopped.  She looked dehydrated and her tongue was dry.  She denies any chest pain,palpitations, dyspnea, abdominal pain, dysuria, fever, chills or headache.   ED Course: X-ray suggestive of  pneumonia.  Started on IV zosyn.  Found to have mildly elevated lactic acid level.  Start on gentle IV fluids.  Review of Systems: As per HPI otherwise 10 point review of systems negative.    Past Medical History:  Diagnosis Date  . Congestive heart failure (CHF)  (HCC)   . COPD (chronic obstructive pulmonary disease) (HCC)   . Dementia   . High cholesterol   . Hypertension   . Stroke (HCC)   . Vision abnormalities     Past Surgical History:  Procedure Laterality Date  . CHOLECYSTECTOMY       reports that she has never smoked. She has never used smokeless tobacco. She reports that she does not drink alcohol or use drugs.  No Known Allergies  Family History  Problem Relation Age of Onset  . Stroke Mother   . Diabetes Mother   . Tuberculosis Father      Prior to Admission medications   Medication Sig Start Date End Date Taking? Authorizing Provider  albuterol (PROAIR HFA) 108 (90 Base) MCG/ACT inhaler Inhale 2 puffs into the lungs every 6 (six) hours as needed for wheezing or shortness of breath.   Yes [provider]  amLODipine (NORVASC) 5 MG tablet Take 5 mg by mouth daily. 04/19/18  Yes [provider]  aspirin EC 81 MG tablet Take 81 mg by mouth daily.   Yes [provider]  atorvastatin (LIPITOR) 40 MG tablet Take 1 tablet (40 mg total) by mouth daily at 6 PM. 02/06/17  Yes Penny Pia, MD  cholecalciferol (VITAMIN D) 1000 units tablet Take 2,000 Units by mouth daily.   Yes [provider]  furosemide (LASIX) 20 MG tablet Take 20 mg by mouth daily.  04/14/17  Yes [provider]  HYDROcodone-acetaminophen (NORCO/VICODIN) 5-325 MG tablet Take 0.5 tablets by mouth 3 (three) times daily.  04/04/17  Yes  [provider]  Ipratropium-Albuterol (COMBIVENT RESPIMAT) 20-100 MCG/ACT AERS respimat Inhale 1 puff into the lungs 4 (four) times daily.   Yes [provider]  LANTUS SOLOSTAR 100 UNIT/ML Solostar Pen Inject 15 Units into the skin every morning. 04/18/18  Yes [provider]  latanoprost (XALATAN) 0.005 % ophthalmic solution Place 1 drop into both eyes at bedtime.    Yes [provider]  Melatonin 10 MG TABS Take 10 mg by mouth at bedtime.   Yes [provider]  Mineral Oil Heavy OIL Place 2 drops into both ears every 7 (seven) days. Thursdays   Yes [provider]  mirtazapine (REMERON) 15 MG tablet Take 0.5 tablets by mouth at bedtime. 05/08/18  Yes [provider]  Multiple Vitamin (DAILY VITE) TABS Take 1 tablet by mouth daily.   Yes [provider]  polycarbophil (FIBERCON) 625 MG tablet Take 625 mg by mouth 2 (two) times daily.   Yes [provider]  polyethylene glycol powder (GLYCOLAX/MIRALAX) powder Take 17 g by mouth once.   Yes [provider]  polyvinyl alcohol (ARTIFICIAL TEARS) 1.4 % ophthalmic solution Place 2 drops into both eyes 4 (four) times daily.   Yes [provider]  ranitidine (ZANTAC) 75 MG tablet Take 75 mg by mouth 2 (two) times daily.   Yes [provider]  senna-docusate (SENOKOT-S) 8.6-50 MG tablet Take 1 tablet by mouth every other day.   Yes [provider]  sertraline (ZOLOFT) 50 MG tablet Take 50 mg by mouth daily. 04/19/18  Yes [provider]    Physical Exam: Vitals:   05/10/18 1206 05/10/18 1330 05/10/18 1400 05/10/18 1527  BP: (!) 149/72 (!) 158/78 (!) 175/71   Pulse: 70 (!) 56 (!) 55   Resp: 16 (!) 22 19   Temp: 98.6 F (37 C)   98.6 F (37 C)  TempSrc: Oral     SpO2: 92% 91% 94%     Constitutional: Not in distress, elderly female,obese Vitals:   05/10/18 1206 05/10/18 1330 05/10/18 1400 05/10/18 1527  BP: (!) 149/72 (!) 158/78 (!) 175/71   Pulse: 70 (!) 56 (!) 55   Resp: 16 (!) 22 19   Temp: 98.6 F (37 C)   98.6 F (37 C)  TempSrc: Oral     SpO2: 92% 91% 94%    Eyes: PERRL, lids and conjunctivae normal ENMT: Mucous membranes are dry. Posterior pharynx clear of any exudate or lesions.Normal dentition.  Neck: normal, supple, no masses, no thyromegaly Respiratory: Crackles on the right lung base.  Normal respiratory effort. No accessory muscle use.  Cardiovascular: Regular rate and rhythm, no murmurs /  rubs / gallops. No extremity edema. 2+ pedal pulses. No carotid bruits.  Abdomen: no tenderness, no masses palpated. No hepatosplenomegaly. Bowel sounds positive.  Musculoskeletal: no clubbing / cyanosis. No joint deformity upper and lower extremities. Good ROM, no contractures. Normal muscle tone.  Skin: no rashes, lesions, ulcers. No induration Neurologic: CN 2-12 grossly intact. Sensation intact, DTR normal. Strength 5/5 in all 4.  Psychiatric: Normal judgment and insight. Alert and oriented x 2. Normal mood.   Foley Catheter:None  Labs on Admission: I have personally reviewed following labs and imaging studies  CBC: Recent Labs  Lab 05/10/18 1224  WBC 7.0  NEUTROABS 4.2  HGB 15.1*  HCT 43.6  MCV 92.6  PLT 193   Basic Metabolic Panel: Recent Labs  Lab 05/10/18 1224  NA 137  K 3.6  CL 94*  CO2 31  GLUCOSE 264*  BUN 14  CREATININE 0.81  CALCIUM 10.1   GFR: CrCl cannot be calculated (Unknown ideal weight.). Liver Function Tests: Recent Labs  Lab 05/10/18 1224  AST 51*  ALT 49  ALKPHOS 140*  BILITOT 0.8  PROT 7.0  ALBUMIN 3.7   Recent Labs  Lab 05/10/18 1250  LIPASE 20   No results for input(s): AMMONIA in the last 168 hours. Coagulation Profile: No results for input(s): INR, PROTIME in the last 168 hours. Cardiac Enzymes: No results for input(s): CKTOTAL, CKMB, CKMBINDEX, TROPONINI in the last 168 hours. BNP (last 3 results) No results for input(s): PROBNP in the last 8760 hours. HbA1C: No results for input(s): HGBA1C in the last 72 hours. CBG: No results for input(s): GLUCAP in the last 168 hours. Lipid Profile: No results for input(s): CHOL, HDL, LDLCALC, TRIG, CHOLHDL, LDLDIRECT in the last 72 hours. Thyroid Function Tests: No results for input(s): TSH, T4TOTAL, FREET4, T3FREE, THYROIDAB in the last 72 hours. Anemia Panel: No results for input(s): VITAMINB12, FOLATE, FERRITIN, TIBC, IRON, RETICCTPCT in the last 72 hours. Urine analysis:      Component Value Date/Time   COLORURINE YELLOW 09/18/2015 1928   APPEARANCEUR CLEAR 09/18/2015 1928   LABSPEC 1.023 09/18/2015 1928   PHURINE 7.0 09/18/2015 1928   GLUCOSEU NEGATIVE 09/18/2015 1928   HGBUR NEGATIVE 09/18/2015 1928   BILIRUBINUR NEGATIVE 09/18/2015 1928   KETONESUR NEGATIVE 09/18/2015 1928   PROTEINUR NEGATIVE 09/18/2015 1928   UROBILINOGEN 0.2 09/18/2015 1928   NITRITE NEGATIVE 09/18/2015 1928   LEUKOCYTESUR NEGATIVE 09/18/2015 1928    Radiological Exams on Admission: Dg Chest 2 View  Result Date: 05/10/2018 CLINICAL DATA:  Patient with cough and shortness of breath. EXAM: CHEST - 2 VIEW COMPARISON:  Chest radiograph 02/02/2017. FINDINGS: Monitoring leads overlie the patient. Low lung volumes. Stable enlarged cardiac and mediastinal contours. Mild elevation of the right hemidiaphragm. Lateral view nondiagnostic. Patchy consolidation within the right mid lung. No pleural effusion or pneumothorax. IMPRESSION: Patchy consolidation within the right mid lung may represent pneumonia in the appropriate clinical setting. Followup PA and lateral chest X-ray is recommended in 3-4 weeks following trial of antibiotic therapy to ensure resolution and exclude underlying malignancy. Electronically Signed   By: Annia Belt M.D.   On: 05/10/2018 13:00     Assessment/Plan Principal Problem:   Pneumonia Active Problems:   Dementia with behavioral disturbance   HLD (hyperlipidemia)   HTN (hypertension)   Stroke (HCC)   Aspiration into airway   Lactic acidosis  Right middle lobe pneumonia: Most likely aspiration pneumonia.  Patient was reported to be vomiting ,several episodes.  Patient is afebrile.  Hemodynamically stable.  Continue Zosyn for now. Will request for speech therapy evaluation.  NPO for now.  Lactic acidosis: Mild elevated lactic acid level.Continue gentle IV fluids.  Diabetes mellitus with hyperglycemia: On insulin at home.  We will continue sliding-scale insulin  here.  HLD: Will continue statin.  Stroke: History of a stroke in 2018.  She is pretty bedbound after that and ambulates with the help of wheelchair only.  Continue aspirin and statin.  HTN: Blood pressure stable.  Continue home meds.  History of diastolic CHF: Echo on 3/18 showed ejection fraction of 55 to 70%,grade 1 diastolic dysfunction.  Patient is on Lasix 20 mg daily at home.  Will hold Lasix for now.  Dementia: Currently mental status stable.  Continue supportive care.   Severity of Illness: The appropriate patient status for this patient  is OBSERVATION.      DVT prophylaxis: SCD Code Status: DNR Family Communication: Granddaughter present at the bedside Consults called: None     Burnadette PopAmrit Aoi Kouns MD [facility] Pager [phone]  If 7PM-7AM, please contact night-coverage [email or web address] Password [email or web address]  05/10/2018, 4:40 PM

## 2018-05-11 DIAGNOSIS — I10 Essential (primary) hypertension: Secondary | ICD-10-CM | POA: Diagnosis not present

## 2018-05-11 DIAGNOSIS — T17908A Unspecified foreign body in respiratory tract, part unspecified causing other injury, initial encounter: Secondary | ICD-10-CM | POA: Diagnosis not present

## 2018-05-11 DIAGNOSIS — I6300 Cerebral infarction due to thrombosis of unspecified precerebral artery: Secondary | ICD-10-CM | POA: Diagnosis not present

## 2018-05-11 DIAGNOSIS — F0151 Vascular dementia with behavioral disturbance: Secondary | ICD-10-CM | POA: Diagnosis not present

## 2018-05-11 DIAGNOSIS — B59 Pneumocystosis: Secondary | ICD-10-CM | POA: Diagnosis not present

## 2018-05-11 LAB — BASIC METABOLIC PANEL
Anion gap: 6 (ref 5–15)
BUN: 15 mg/dL (ref 6–20)
CALCIUM: 9.3 mg/dL (ref 8.9–10.3)
CO2: 32 mmol/L (ref 22–32)
CREATININE: 0.89 mg/dL (ref 0.44–1.00)
Chloride: 102 mmol/L (ref 101–111)
GFR calc Af Amer: >60 mL/min (ref >60)
GFR, EST NON AFRICAN AMERICAN: 53 mL/min — AB (ref >60)
Glucose, Bld: 216 mg/dL — ABNORMAL HIGH (ref 65–99)
Potassium: 3.5 mmol/L (ref 3.5–5.1)
Sodium: 140 mmol/L (ref 135–145)

## 2018-05-11 LAB — CBC
HCT: 42 % (ref 36.0–46.0)
Hemoglobin: 13.8 g/dL (ref 12.0–15.0)
MCH: 31.2 pg (ref 26.0–34.0)
MCHC: 32.9 g/dL (ref 30.0–36.0)
MCV: 95 fL (ref 78.0–100.0)
Platelets: 177 10*3/uL (ref 150–400)
RBC: 4.42 MIL/uL (ref 3.87–5.11)
RDW: 13.5 % (ref 11.5–15.5)
WBC: 6.6 10*3/uL (ref 4.0–10.5)

## 2018-05-11 LAB — GLUCOSE, CAPILLARY
GLUCOSE-CAPILLARY: 215 mg/dL — AB (ref 65–99)
Glucose-Capillary: 329 mg/dL — ABNORMAL HIGH (ref 65–99)
Glucose-Capillary: 241 mg/dL — ABNORMAL HIGH (ref 65–99)
Glucose-Capillary: 237 mg/dL — ABNORMAL HIGH (ref 65–99)

## 2018-05-11 LAB — LACTIC ACID, PLASMA: LACTIC ACID, VENOUS: 1.7 mmol/L (ref 0.5–1.9)

## 2018-05-11 MED ORDER — INSULIN GLARGINE 100 UNIT/ML ~~LOC~~ SOLN
10.0000 [IU] | Freq: Every day | SUBCUTANEOUS | Status: DC
  Administered 2018-05-11 – 2018-05-12 (×2): 10 [IU] via SUBCUTANEOUS
  Filled 2018-05-11 (×2): qty 0.1

## 2018-05-11 MED ORDER — ENOXAPARIN SODIUM 40 MG/0.4ML ~~LOC~~ SOLN
40.0000 mg | SUBCUTANEOUS | Status: DC
  Administered 2018-05-11 – 2018-05-12 (×2): 40 mg via SUBCUTANEOUS
  Filled 2018-05-11 (×2): qty 0.4

## 2018-05-11 NOTE — NC FL2 (Addendum)
[place] MEDICAID FL2 LEVEL OF CARE SCREENING TOOL     IDENTIFICATION  Patient Name: Katie Mcfarland Birthdate: [birth year] Sex: female Admission Date (Current Location): 05/10/2018  [facility] and [place] Number:  Producer, television/film/video and Address:  [facility],  [address] [place] [address]      Provider Number: [id number]  Attending Physician Name and Address:  Richarda Overlie, MD  Relative Name and Phone Number:       Current Level of Care: Hospital Recommended Level of Care: Memory Care Prior Approval Number:    Date Approved/Denied:   PASRR Number:    Discharge Plan: Other (Comment)(Memorycare )    Current Diagnoses: Patient Active Problem List   Diagnosis Date Noted  . Aspiration into airway 05/10/2018  . Lactic acidosis 05/10/2018  . Stroke (HCC) 02/02/2017  . HLD (hyperlipidemia) 09/19/2015  . HTN (hypertension) 09/19/2015  . Acute encephalopathy 09/19/2015  . Pneumonia 09/18/2015  . Dementia with behavioral disturbance 09/18/2015    Orientation RESPIRATION BLADDER Height & Weight     Self  Normal Continent Weight: 163 lb 2.3 oz (74 kg) Height:  4\' 8"  (142.2 cm)  BEHAVIORAL SYMPTOMS/MOOD NEUROLOGICAL BOWEL NUTRITION STATUS      Continent Diet(Carb Modified )  AMBULATORY STATUS COMMUNICATION OF NEEDS Skin     Verbally Normal                       Personal Care Assistance Level of Assistance  Bathing, Feeding, Dressing Bathing Assistance: Maximum assistance Feeding assistance: Limited assistance(Need sit up 90 degree angle) Dressing Assistance: Maximum assistance     Functional Limitations Info  Sight, Hearing, Speech Sight Info: Adequate Hearing Info: Adequate Speech Info: Adequate    SPECIAL CARE FACTORS FREQUENCY  PT (By licensed PT)     PT Frequency: 5X/WEEK               Contractures Contractures Info: Not present    Additional Factors Info  Code Status, Allergies, Insulin Sliding Scale,  Psychotropic Code Status Info: DNR Allergies Info: Allergies: No Known Allergies Psychotropic Info: Zoloft Insulin Sliding Scale Info: See summary        Current Medications (05/11/2018):  This is the current hospital active medication list Current Facility-Administered Medications  Medication Dose Route Frequency Provider Last Rate Last Dose  . 0.9 %  sodium chloride infusion   Intravenous Continuous Burnadette Pop, MD 75 mL/hr at 05/10/18 1809    . amLODipine (NORVASC) tablet 5 mg  5 mg Oral Daily Burnadette Pop, MD   5 mg at 05/11/18 1057  . aspirin EC tablet 81 mg  81 mg Oral Daily Burnadette Pop, MD   81 mg at 05/11/18 1057  . atorvastatin (LIPITOR) tablet 40 mg  40 mg Oral q1800 Burnadette Pop, MD   40 mg at 05/10/18 1838  . chlorhexidine (PERIDEX) 0.12 % solution 15 mL  15 mL Mouth Rinse BID Burnadette Pop, MD   15 mL at 05/11/18 1058  . cholecalciferol (VITAMIN D) tablet 2,000 Units  2,000 Units Oral Daily Burnadette Pop, MD   2,000 Units at 05/11/18 1057  . enoxaparin (LOVENOX) injection 40 mg  40 mg Subcutaneous Q24H Abrol, Nayana, MD      . famotidine (PEPCID) tablet 20 mg  20 mg Oral Daily Adhikari, Amrit, MD   20 mg at 05/11/18 1057  . insulin aspart (novoLOG) injection 0-15 Units  0-15 Units Subcutaneous TID WC Burnadette Pop, MD   5 Units at  05/11/18 [id number]  . insulin aspart (novoLOG) injection 0-5 Units  0-5 Units Subcutaneous QHS Burnadette PopAdhikari, Amrit, MD   3 Units at 05/10/18 2221  . insulin glargine (LANTUS) injection 10 Units  10 Units Subcutaneous Daily Richarda OverlieAbrol, Nayana, MD   10 Units at 05/11/18 1058  . ipratropium-albuterol (DUONEB) 0.5-2.5 (3) MG/3ML nebulizer solution 3 mL  3 mL Nebulization TID Burnadette PopAdhikari, Amrit, MD   3 mL at 05/11/18 0918  . MEDLINE mouth rinse  15 mL Mouth Rinse q12n4p Adhikari, Amrit, MD      . mirtazapine (REMERON) tablet 7.5 mg  7.5 mg Oral QHS Burnadette PopAdhikari, Amrit, MD   7.5 mg at 05/10/18 2221  . ondansetron (ZOFRAN) injection 4 mg  4 mg Intravenous Once  Mackuen, Courteney Lyn, MD   Stopped at 05/10/18 1538  . ondansetron (ZOFRAN) injection 4 mg  4 mg Intravenous Q6H PRN Adhikari, Amrit, MD      . piperacillin-tazobactam (ZOSYN) IVPB 3.375 g  3.375 g Intravenous Q8H Cindi CarbonSwayne, Mary M, RPH 12.5 mL/hr at 05/11/18 0532 3.375 g at 05/11/18 0532  . sertraline (ZOLOFT) tablet 50 mg  50 mg Oral Daily Burnadette PopAdhikari, Amrit, MD   50 mg at 05/11/18 1057     Discharge Medications: Please see discharge summary for a list of discharge medications.  Relevant Imaging Results:  Relevant Lab Results:   Additional Information SSN: [id number]  Clearance CootsNicole A Mako Pelfrey, LCSW

## 2018-05-11 NOTE — Progress Notes (Signed)
PT Cancellation Note  Patient Details Name: Katie Mcfarland MRN: [id number] DOB: November 14, 1920   Cancelled Treatment:    Reason Eval/Treat Not Completed: PT screened, no needs identified, will sign off; pt has caregivers that assist with mobility/transfers  at her ALF--pt grand-daughter and RN confirm, no changes anticipated and therefore no PT needs at this time; please re-consult if needed; thank you   [facility] CenterWILLIAMS,Jizel Cheeks 05/11/2018, 4:11 PM

## 2018-05-11 NOTE — Progress Notes (Signed)
[facility] PROGRESS NOTE  Katie Mcfarland UXL:[id number] DOB: [birth year] DOA: 05/10/2018   PCP: Merri Brunette, MD     Assessment/Plan: Principal Problem:   Pneumonia Active Problems:   Dementia with behavioral disturbance   HLD (hyperlipidemia)   HTN (hypertension)   Stroke (HCC)   Aspiration into airway   Lactic acidosis  84 y.o. female with medical history significant of hypertension, hyperlipidemia, stroke, dementia who presents from the assisted living facility with complaints of dyspnea. Admitted for pneumonia likely secondary to aspiration  Assessment and plan Right middle lobe pneumonia: Most likely aspiration pneumonia.  Patient was reported to be vomiting ,several episodes.  Patient is afebrile.  Hemodynamically stable.  initiated on Zosyn for now. If blood cultures remain negative, switch to Augmentin Will request for speech therapy evaluation.    Lactic acidosis: Mild elevated lactic acid level.Continue gentle IV fluids.  Diabetes mellitus with hyperglycemia: On insulin at home.  We will continue sliding-scale insulin here.. Accu-Chek slightly elevated.resume Lantus at 10 units.  HLD: Will continue statin.  Stroke: History of a stroke in 2018.  She is pretty bedbound after that and ambulates with the help of wheelchair only.  Continue aspirin and statin.  HTN: Blood pressure stable.  Continue home meds.  History of diastolic CHF: Echo on 3/18 showed ejection fraction of 55 to 70%,grade 1 diastolic dysfunction.  Patient is on Lasix 20 mg daily at home.  Will hold Lasix for now.  Dementia: Currently mental status stable.  Continue supportive care.   DVT prophylaxsis Lovenox  Code Status:   DO NOT RESUSCITATE   Family Communication: Discussed in detail with the patient, all imaging results, lab results explained to the patient   Disposition Plan:   Pending improvement of pneumonia       Consultants:  none  Procedures:  none  Antibiotics: Anti-infectives (From admission, onward)   Start     Dose/Rate Route Frequency Ordered Stop   05/10/18 2200  piperacillin-tazobactam (ZOSYN) IVPB 3.375 g     3.375 g 12.5 mL/hr over 240 Minutes Intravenous Every 8 hours 05/10/18 1712     05/10/18 1500  piperacillin-tazobactam (ZOSYN) IVPB 3.375 g     3.375 g 100 mL/hr over 30 Minutes Intravenous  Once 05/10/18 1449 05/10/18 1617   05/10/18 1415  amoxicillin-clavulanate (AUGMENTIN) 875-125 MG per tablet 1 tablet  Status:  Discontinued     1 tablet Oral  Once 05/10/18 1413 05/10/18 1443   05/10/18 1415  doxycycline (VIBRA-TABS) tablet 100 mg  Status:  Discontinued     100 mg Oral  Once 05/10/18 1413 05/10/18 1443         HPI/Subjective:  patient denies any significant cough shortness of breath or chest pain, had trouble swallowing her pills  Objective: Vitals:   05/10/18 1813 05/10/18 2125 05/10/18 2252 05/11/18 0607  BP:  (!) 136/58  (!) 111/54  Pulse:  64  (!) 56  Resp:  18  18  Temp:  (!) 97.5 F (36.4 C)  (!) 97.5 F (36.4 C)  TempSrc:  Oral    SpO2:  93% (!) 88% 97%  Weight:      Height: 4\' 8"  (1.422 m)       Intake/Output Summary (Last 24 hours) at 05/11/2018 0829 Last data filed at 05/11/2018 0300 Gross per 24 hour  Intake 883.75 ml  Output -  Net 883.75 ml    Exam:  Examination:  General exam: Appears calm and comfortable  Respiratory  system: Clear to auscultation. Respiratory effort normal. Cardiovascular system: S1 & S2 heard, RRR. No JVD, murmurs, rubs, gallops or clicks. No pedal edema. Gastrointestinal system: Abdomen is nondistended, soft and nontender. No organomegaly or masses felt. Normal bowel sounds heard. Central nervous system: Alert and oriented. No focal neurological deficits. Extremities: Symmetric 5 x 5 power. Skin: No rashes, lesions or ulcers Psychiatry: Judgement and insight appear normal. Mood & affect appropriate.      Data Reviewed: I have personally reviewed following labs and imaging studies  Micro Results Recent Results (from the past 240 hour(s))  MRSA PCR Screening     Status: None   Collection Time: 05/10/18  7:37 PM  Result Value Ref Range Status   MRSA by PCR NEGATIVE NEGATIVE Final    Comment:        The GeneXpert MRSA Assay (FDA approved for NASAL specimens only), is one component of a comprehensive MRSA colonization surveillance program. It is not intended to diagnose MRSA infection nor to guide or monitor treatment for MRSA infections. Performed at [facility], [address] [place], [place] [address]     Radiology Reports Dg Chest 2 View  Result Date: 05/10/2018 CLINICAL DATA:  Patient with cough and shortness of breath. EXAM: CHEST - 2 VIEW COMPARISON:  Chest radiograph 02/02/2017. FINDINGS: Monitoring leads overlie the patient. Low lung volumes. Stable enlarged cardiac and mediastinal contours. Mild elevation of the right hemidiaphragm. Lateral view nondiagnostic. Patchy consolidation within the right mid lung. No pleural effusion or pneumothorax. IMPRESSION: Patchy consolidation within the right mid lung may represent pneumonia in the appropriate clinical setting. Followup PA and lateral chest X-ray is recommended in 3-4 weeks following trial of antibiotic therapy to ensure resolution and exclude underlying malignancy. Electronically Signed   By: Annia Beltrew  Davis M.D.   On: 05/10/2018 13:00     CBC Recent Labs  Lab 05/10/18 1224 05/11/18 0506  WBC 7.0 6.6  HGB 15.1* 13.8  HCT 43.6 42.0  PLT 193 177  MCV 92.6 95.0  MCH 32.1 31.2  MCHC 34.6 32.9  RDW 13.2 13.5  LYMPHSABS 2.1  --   MONOABS 0.5  --   EOSABS 0.1  --   BASOSABS 0.0  --     Chemistries  Recent Labs  Lab 05/10/18 1224 05/11/18 0506  NA 137 140  K 3.6 3.5  CL 94* 102  CO2 31 32  GLUCOSE 264* 216*  BUN 14 15  CREATININE 0.81 0.89  CALCIUM 10.1 9.3  AST 51*  --   ALT 49  --    ALKPHOS 140*  --   BILITOT 0.8  --    ------------------------------------------------------------------------------------------------------------------ estimated creatinine clearance is 30.7 mL/min (by C-G formula based on SCr of 0.89 mg/dL). ------------------------------------------------------------------------------------------------------------------ No results for input(s): HGBA1C in the last 72 hours. ------------------------------------------------------------------------------------------------------------------ No results for input(s): CHOL, HDL, LDLCALC, TRIG, CHOLHDL, LDLDIRECT in the last 72 hours. ------------------------------------------------------------------------------------------------------------------ No results for input(s): TSH, T4TOTAL, T3FREE, THYROIDAB in the last 72 hours.  Invalid input(s): FREET3 ------------------------------------------------------------------------------------------------------------------ No results for input(s): VITAMINB12, FOLATE, FERRITIN, TIBC, IRON, RETICCTPCT in the last 72 hours.  Coagulation profile No results for input(s): INR, PROTIME in the last 168 hours.  No results for input(s): DDIMER in the last 72 hours.  Cardiac Enzymes No results for input(s): CKMB, TROPONINI, MYOGLOBIN in the last 168 hours.  Invalid input(s): CK ------------------------------------------------------------------------------------------------------------------ Invalid input(s): POCBNP   CBG: Recent Labs  Lab 05/10/18 1706 05/10/18 2120 05/11/18 0732  GLUCAP 161* 290* 215*  Studies: Dg Chest 2 View  Result Date: 05/10/2018 CLINICAL DATA:  Patient with cough and shortness of breath. EXAM: CHEST - 2 VIEW COMPARISON:  Chest radiograph 02/02/2017. FINDINGS: Monitoring leads overlie the patient. Low lung volumes. Stable enlarged cardiac and mediastinal contours. Mild elevation of the right hemidiaphragm. Lateral view nondiagnostic.  Patchy consolidation within the right mid lung. No pleural effusion or pneumothorax. IMPRESSION: Patchy consolidation within the right mid lung may represent pneumonia in the appropriate clinical setting. Followup PA and lateral chest X-ray is recommended in 3-4 weeks following trial of antibiotic therapy to ensure resolution and exclude underlying malignancy. Electronically Signed   By: Annia Belt M.D.   On: 05/10/2018 13:00      Lab Results  Component Value Date   HGBA1C 7.7 (H) 02/03/2017   Lab Results  Component Value Date   LDLCALC 177 (H) 02/03/2017   CREATININE 0.89 05/11/2018       Scheduled Meds: . amLODipine  5 mg Oral Daily  . aspirin EC  81 mg Oral Daily  . atorvastatin  40 mg Oral q1800  . chlorhexidine  15 mL Mouth Rinse BID  . cholecalciferol  2,000 Units Oral Daily  . famotidine  20 mg Oral Daily  . insulin aspart  0-15 Units Subcutaneous TID WC  . insulin aspart  0-5 Units Subcutaneous QHS  . ipratropium-albuterol  3 mL Nebulization TID  . mouth rinse  15 mL Mouth Rinse q12n4p  . mirtazapine  7.5 mg Oral QHS  . ondansetron (ZOFRAN) IV  4 mg Intravenous Once  . sertraline  50 mg Oral Daily   Continuous Infusions: . sodium chloride 75 mL/hr at 05/10/18 1809  . piperacillin-tazobactam (ZOSYN)  IV 3.375 g (05/11/18 0532)     LOS: 0 days    Time spent: >30 MINS    Richarda Overlie  [facility] Pager [phone]. If 7PM-7AM, please contact night-coverage at [email or web address], password [facility] 05/11/2018, 8:29 AM  LOS: 0 days

## 2018-05-11 NOTE — Care Management Note (Signed)
Case Management Note  Patient Details  Name: Katie Mcfarland MRN: [id number] Date of Birth: February 28, 1920  Subjective[facility]Objective[facility]                   Action/Plan:   Expected Discharge Date:  (unknown)               Expected Discharge Plan:  Home w Home Health Services  In-House Referral:  Clinical Social Work  Discharge planning Services  CM Consult  Post Acute Care Choice:  Resumption of Svcs/PTA Provider([facility] 6 days a week-8a-6p) Choice offered to:     DME Arranged:    DME Agency:     HH Arranged:    HH Agency:     Status of Service:  In process, will continue to follow  If discussed at Long Length of Stay Meetings, dates discussed:    Additional Comments:  [name], [name], RN 05/11/2018, 2:02 PM

## 2018-05-11 NOTE — Care Management Obs Status (Signed)
MEDICARE OBSERVATION STATUS NOTIFICATION   Patient Details  Name: Katie Mcfarland MRN: [id number] Date of Birth: August 30, 1920   Medicare Observation Status Notification Given:  Yes    MahabirOlegario Messier, Aniello Christopoulos, RN 05/11/2018, 2:00 PM

## 2018-05-11 NOTE — Evaluation (Signed)
Clinical/Bedside Swallow Evaluation Patient Details  Name: Katie Mcfarland MRN: [id number] Date of Birth: [birth year]  Today's Date: 05/11/2018 Time: SLP Start Time (ACUTE ONLY): 1240 SLP Stop Time (ACUTE ONLY): 1300 SLP Time Calculation (min) (ACUTE ONLY): 20 min  Past Medical History:  Past Medical History:  Diagnosis Date  . Congestive heart failure (CHF) (HCC)   . COPD (chronic obstructive pulmonary disease) (HCC)   . Dementia   . High cholesterol   . Hypertension   . Stroke (HCC)   . Vision abnormalities    Past Surgical History:  Past Surgical History:  Procedure Laterality Date  . CHOLECYSTECTOMY     HPI:  84 year old female admitted 05/10/18 with SOB, vomiting. PMH: dementia, COPD, stroke   Assessment / Plan / Recommendation Clinical Impression  Pt presents with adequate oral motor strength and function. Pt was observed during lunch (regular solids, thin liquids). Pt exhibited cough once during po trials. Pt underwent MBS in March 2019, with recommendation for regular solids and thin liquids. No penetration or aspiration was seen on this study. However, pt does have several high risk indicators for dysphagia, including history of stroke, dementia, and COPD. Will continue regular solids and thin liquids. Caregiver was encouraged to choose mostly soft solids for energy conservation. Safe swallow precautions were posted at [facility], and include caution with mixed consistencies (pt's one cough episode was noted following sip of thin liquid when pt still had grilled cheese sandwich in her mouth). ST will continue to follow to assess diet tolerance and determine if repeat MBS is warranted. Deterioration of swallow safety is common with advanced dementia. RN was also informed of results and recommendations.    SLP Visit Diagnosis: Dysphagia, unspecified (R13.10)    Aspiration Risk  Moderate aspiration risk;Mild aspiration risk    Diet Recommendation Regular;Dysphagia 3 (Mech  soft);Thin liquid   Liquid Administration via: Cup;Straw Medication Administration: Whole meds with liquid Supervision: Patient able to self feed;Staff to assist with self feeding;Full supervision/cueing for compensatory strategies Compensations: Slow rate;Small sips/bites;Minimize environmental distractions(avoid mixed consistencies) Postural Changes: Seated upright at 90 degrees;Remain upright for at least 30 minutes after po intake    Other  Recommendations Oral Care Recommendations: Oral care QID   Follow up Recommendations None      Frequency and Duration min 1 x/week  1 week;2 weeks       Prognosis Prognosis for Safe Diet Advancement: Fair Barriers to Reach Goals: Cognitive deficits      Swallow Study   General Date of Onset: 05/10/18 HPI: 84 year old female admitted 05/10/18 with SOB, vomiting. PMH: dementia, COPD, stroke Type of Study: Bedside Swallow Evaluation Previous Swallow Assessment: MBS 02/04/18 = no pen/asp, rec reg/thin Diet Prior to this Study: Regular;Thin liquids Temperature Spikes Noted: No Respiratory Status: Nasal cannula History of Recent Intubation: No Behavior/Cognition: Cooperative;Alert Oral Cavity Assessment: Within Functional Limits Oral Care Completed by SLP: No Oral Cavity - Dentition: Adequate natural dentition Vision: Functional for self-feeding Self-Feeding Abilities: Able to feed self Patient Positioning: Upright in bed Baseline Vocal Quality: Normal Volitional Cough: Weak Volitional Swallow: Able to elicit    Oral/Motor/Sensory Function Overall Oral Motor/Sensory Function: Within functional limits   Ice Chips Ice chips: Not tested   Thin Liquid Thin Liquid: Within functional limits Presentation: Straw    Nectar Thick Nectar Thick Liquid: Not tested   Honey Thick Honey Thick Liquid: Not tested   Puree Puree: Within functional limits Presentation: Self Fed   Solid   GO  Solid: Within functional limits Presentation: Self Fed        Lawrie Tunks B. Murvin NatalBueche, [facility]P, CCC-SLP Speech Language Pathologist [phone]  Leigh AuroraBueche, Xitlally Mooneyham Brown 05/11/2018,2:19 PM

## 2018-05-11 NOTE — Clinical Social Work Note (Addendum)
Clinical Social Work Assessment  Patient Details  Name: Katie RichesMildred Schillinger Mcfarland MRN: [id number] Date of Birth: 13-May-1920  Date of referral:  05/11/18               Reason for consult:  Facility Placement                Permission sought to share information with:  Facility Medical sales representativeContact Representative, Family Supports Permission granted to share information::  Yes, Verbal Permission Granted  Name::        Agency::  [facility]   Relationship::   Katie PilgrimLinda Mcfarland [phone]/[phone]/Katie Mcfarland-[phone]  Contact Information:     Housing/Transportation Living arrangements for the past 2 months:  Assisted Living Facility Source of Information:  Adult Children, Power of Attorney Patient Interpreter Needed:  None Criminal Activity/Legal Involvement Pertinent to Current Situation/Hospitalization:  No - Comment as needed Significant Relationships:  Adult Children Lives with:  Adult Children Do you feel safe going back to the place where you live?  Yes Need for family participation in patient care:  Yes (Has dementia)  Care giving concerns:   No concerns presented about patient care.  Patient daughter reports she was concern about the patient being dehydrated and requested the ALF staff send the patient to hospital.   Social Worker assessment / plan:  The patient is oriented to self. CSW talk with patient daughter about patient discharge plan. She reports the patient is a resident [facility] and will return at discharge. The patient has been their for 2 1/2 years. She is bed bound, uses a wheel chair at baseline and needs assistance with all her ADL's. She reports the patient is at risk for aspiration and when given medications and food has to sit up at 90 degree angle.   CSW spoke with Katie NeedleMichael the [employer] [facility], he reports if the patient is ready over the weekend call [phone] he will assist with patient transport.   FL2 to be completed.   Plan:  back to Memory care.   Employment status:  Retired Health and safety inspectornsurance information:  Managed Care PT Recommendations:  Not assessed at this time Information / Referral to community resources:     Patient/Family's Response to care:  Agreeable and Responding well to care.   Patient/Family's Understanding of and Emotional Response to Diagnosis, Current Treatment, and Prognosis:  Patient daughter and granddaughter are both very involved in patient care and have a good understanding of diagnosis and treatment.   Emotional Assessment Appearance:  Appears stated age Attitude/Demeanor/Rapport:    Affect (typically observed):    Orientation:  Oriented to Self Alcohol / Substance use:  Not Applicable Psych involvement (Current and /or in the community):  No (Comment)  Discharge Needs  Concerns to be addressed:  Discharge Planning Concerns Readmission within the last 30 days:  No Current discharge risk:  None Barriers to Discharge:  Continued Medical Work up   [employer] A Vallory Oetken, LCSW 05/11/2018, 12:06 PM

## 2018-05-11 NOTE — Progress Notes (Signed)
RN attempted to administer medications whole with applesauce. Pt has difficulty swallowing pills. Pt is was successful after 5 attempts. Will update provider

## 2018-05-12 DIAGNOSIS — T17908A Unspecified foreign body in respiratory tract, part unspecified causing other injury, initial encounter: Secondary | ICD-10-CM | POA: Diagnosis not present

## 2018-05-12 DIAGNOSIS — F0151 Vascular dementia with behavioral disturbance: Secondary | ICD-10-CM | POA: Diagnosis not present

## 2018-05-12 DIAGNOSIS — B59 Pneumocystosis: Secondary | ICD-10-CM | POA: Diagnosis not present

## 2018-05-12 DIAGNOSIS — I10 Essential (primary) hypertension: Secondary | ICD-10-CM | POA: Diagnosis not present

## 2018-05-12 LAB — CBC
HEMATOCRIT: 40.3 % (ref 36.0–46.0)
Hemoglobin: 13.4 g/dL (ref 12.0–15.0)
MCH: 31.8 pg (ref 26.0–34.0)
MCHC: 33.3 g/dL (ref 30.0–36.0)
MCV: 95.5 fL (ref 78.0–100.0)
Platelets: 161 10*3/uL (ref 150–400)
RBC: 4.22 MIL/uL (ref 3.87–5.11)
RDW: 13.4 % (ref 11.5–15.5)
WBC: 6.1 10*3/uL (ref 4.0–10.5)

## 2018-05-12 LAB — GLUCOSE, CAPILLARY: Glucose-Capillary: 257 mg/dL — ABNORMAL HIGH (ref 65–99)

## 2018-05-12 MED ORDER — AMOXICILLIN-POT CLAVULANATE 500-125 MG PO TABS
1.0000 | ORAL_TABLET | Freq: Two times a day (BID) | ORAL | Status: DC
Start: 2018-05-12 — End: 2018-05-12
  Filled 2018-05-12: qty 1

## 2018-05-12 MED ORDER — AMOXICILLIN-POT CLAVULANATE 600-42.9 MG/5ML PO SUSR: 600.0000 mg | Freq: Two times a day (BID) | ORAL | 0 refills | Status: AC

## 2018-05-12 MED ORDER — AMOXICILLIN-POT CLAVULANATE 600-42.9 MG/5ML PO SUSR
600.0000 mg | Freq: Two times a day (BID) | ORAL | Status: DC
  Administered 2018-05-12: 600 mg via ORAL
  Filled 2018-05-12: qty 5

## 2018-05-12 MED ORDER — POLYETHYLENE GLYCOL 3350 17 GM/SCOOP PO POWD
17.0000 g | Freq: Once | ORAL | Status: DC
  Filled 2018-05-12: qty 255

## 2018-05-12 MED ORDER — FUROSEMIDE 20 MG PO TABS
20.0000 mg | ORAL_TABLET | Freq: Every day | ORAL | Status: DC
  Administered 2018-05-12: 20 mg via ORAL
  Filled 2018-05-12: qty 1

## 2018-05-12 MED ORDER — SENNOSIDES-DOCUSATE SODIUM 8.6-50 MG PO TABS
1.0000 | ORAL_TABLET | ORAL | Status: DC
  Administered 2018-05-12: 1 via ORAL
  Filled 2018-05-12: qty 1

## 2018-05-12 MED ORDER — HYDROCODONE-ACETAMINOPHEN 5-325 MG PO TABS: 0.5000 | ORAL_TABLET | Freq: Three times a day (TID) | ORAL | 0 refills | Status: AC

## 2018-05-12 MED ORDER — HYDRALAZINE HCL 20 MG/ML IJ SOLN: 5.0000 mg | Freq: Four times a day (QID) | INTRAMUSCULAR | Status: DC | PRN

## 2018-05-12 MED ORDER — FUROSEMIDE 10 MG/ML IJ SOLN
20.0000 mg | Freq: Once | INTRAMUSCULAR | Status: AC
  Administered 2018-05-12: 20 mg via INTRAVENOUS
  Filled 2018-05-12: qty 2

## 2018-05-12 MED ORDER — MELATONIN 5 MG PO TABS: 10.0000 mg | ORAL_TABLET | Freq: Every day | ORAL | Status: DC

## 2018-05-12 NOTE — Progress Notes (Signed)
Patient with elevated blood pressure this am, 183/63. Dr David StallFeliz Ortiz was notified via text page. No return call or new orders received yet. Will continue to monitor patient.

## 2018-05-12 NOTE — Progress Notes (Signed)
Pharmacist-Provider Communication:  Order for augmentin 875 mg PO BID changed to Augmentin 500 mg PO BID on order verification for renal dose adjustment (CrCl ~ 28 mL/min using adjusted body weight). Per protocol.   Cindi CarbonMary M Daymein Nunnery, PharmD 05/12/18 9:53 AM

## 2018-05-12 NOTE — Discharge Summary (Signed)
Physician Discharge Summary  Katie RichesMildred Schillinger Mcfarland WUJ:[id number] DOB: [birth year] DOA: 05/10/2018  PCP: Merri BrunettePharr, Walter, MD  Admit date: 05/10/2018 Discharge date: 05/12/2018  Admitted From: Memory care unit  Disposition: Memory care unit   Recommendations for Outpatient Follow-up:  1. Follow up with PCP in 1-2 weeks 2. Palliative to follow at facility.  Home Health:no  Equipment/Devices:None  Discharge Condition:stable CODE STATUS:DNR Diet recommendation: Heart Healthy   Brief/Interim Summary: 84 y.o. female past medical history of essential hypertension hyperlipidemia stroke dementia who comes in from an assisted living facility for pneumonia.    Discharge Diagnoses:  Principal Problem:   Pneumonia Active Problems:   Dementia with behavioral disturbance   HLD (hyperlipidemia)   HTN (hypertension)   Stroke (HCC)   Aspiration into airway   Lactic acidosis  Right middle lobe pneumonia likely due to aspiration: On admission she was started on IV Zosyn and speech evaluation was on the recommended a dysphagia 3 diet. She was changed to liquid Augmentin which she will continue as an outpatient for total of 7 days.  Dementia with behavioral disturbances: No changes were made.  Hyperlipidemia: No changes were made.  Essential hypertension: No changes were made.  History of CVA: No changes were made to her medication.  Discharge Instructions  Discharge Instructions    Diet - low sodium heart healthy   Complete by:  As directed    Increase activity slowly   Complete by:  As directed      Allergies as of 05/12/2018   No Known Allergies     Medication List    TAKE these medications   amLODipine 5 MG tablet Commonly known as:  NORVASC Take 5 mg by mouth daily.   amoxicillin-clavulanate 600-42.9 MG/5ML suspension Commonly known as:  AUGMENTIN Take 5 mLs (600 mg total) by mouth 2 (two) times daily for 4 days.   ARTIFICIAL TEARS 1.4 % ophthalmic  solution Generic drug:  polyvinyl alcohol Place 2 drops into both eyes 4 (four) times daily.   aspirin EC 81 MG tablet Take 81 mg by mouth daily.   atorvastatin 40 MG tablet Commonly known as:  LIPITOR Take 1 tablet (40 mg total) by mouth daily at 6 PM.   cholecalciferol 1000 units tablet Commonly known as:  VITAMIN D Take 2,000 Units by mouth daily.   COMBIVENT RESPIMAT 20-100 MCG/ACT Aers respimat Generic drug:  Ipratropium-Albuterol Inhale 1 puff into the lungs 4 (four) times daily.   DAILY VITE Tabs Take 1 tablet by mouth daily.   furosemide 20 MG tablet Commonly known as:  LASIX Take 20 mg by mouth daily.   HYDROcodone-acetaminophen 5-325 MG tablet Commonly known as:  NORCO/VICODIN Take 0.5 tablets by mouth 3 (three) times daily.   LANTUS SOLOSTAR 100 UNIT/ML Solostar Pen Generic drug:  Insulin Glargine Inject 15 Units into the skin every morning.   latanoprost 0.005 % ophthalmic solution Commonly known as:  XALATAN Place 1 drop into both eyes at bedtime.   Melatonin 10 MG Tabs Take 10 mg by mouth at bedtime.   Mineral Oil Heavy Oil Place 2 drops into both ears every 7 (seven) days. Thursdays   mirtazapine 15 MG tablet Commonly known as:  REMERON Take 0.5 tablets by mouth at bedtime.   polycarbophil 625 MG tablet Commonly known as:  FIBERCON Take 625 mg by mouth 2 (two) times daily.   polyethylene glycol powder powder Commonly known as:  GLYCOLAX/MIRALAX Take 17 g by mouth once.   PROAIR HFA 108 (90 Base)  MCG/ACT inhaler Generic drug:  albuterol Inhale 2 puffs into the lungs every 6 (six) hours as needed for wheezing or shortness of breath.   ranitidine 75 MG tablet Commonly known as:  ZANTAC Take 75 mg by mouth 2 (two) times daily.   senna-docusate 8.6-50 MG tablet Commonly known as:  Senokot-S Take 1 tablet by mouth every other day.   sertraline 50 MG tablet Commonly known as:  ZOLOFT Take 50 mg by mouth daily.       No Known  Allergies  Consultations:  None   Procedures/Studies: Dg Chest 2 View  Result Date: 05/10/2018 CLINICAL DATA:  Patient with cough and shortness of breath. EXAM: CHEST - 2 VIEW COMPARISON:  Chest radiograph 02/02/2017. FINDINGS: Monitoring leads overlie the patient. Low lung volumes. Stable enlarged cardiac and mediastinal contours. Mild elevation of the right hemidiaphragm. Lateral view nondiagnostic. Patchy consolidation within the right mid lung. No pleural effusion or pneumothorax. IMPRESSION: Patchy consolidation within the right mid lung may represent pneumonia in the appropriate clinical setting. Followup PA and lateral chest X-ray is recommended in 3-4 weeks following trial of antibiotic therapy to ensure resolution and exclude underlying malignancy. Electronically Signed   By: Annia Belt M.D.   On: 05/10/2018 13:00     Subjective: She has no new complaints.  Discharge Exam: Vitals:   05/12/18 0830 05/12/18 0936  BP: (!) 175/57   Pulse: 62 61  Resp:  18  Temp:    SpO2:  98%   Vitals:   05/12/18 0513 05/12/18 0701 05/12/18 0830 05/12/18 0936  BP: (!) 192/76 (!) 183/63 (!) 175/57   Pulse: 61 61 62 61  Resp: 16   18  Temp: 98 F (36.7 C)     TempSrc: Oral     SpO2: 94%   98%  Weight:      Height:        General: Pt is alert, awake, not in acute distress Cardiovascular: RRR, S1/S2 +, no rubs, no gallops Respiratory: CTA bilaterally, no wheezing, no rhonchi Abdominal: Soft, NT, ND, bowel sounds + Extremities: no edema, no cyanosis    The results of significant diagnostics from this hospitalization (including imaging, microbiology, ancillary and laboratory) are listed below for reference.     Microbiology: Recent Results (from the past 240 hour(s))  Culture, blood (routine x 2)     Status: None (Preliminary result)   Collection Time: 05/10/18 12:51 PM  Result Value Ref Range Status   Specimen Description   Final    BLOOD LEFT ANTECUBITAL Performed at [facility], [address] [place], [place] [address]    Special Requests   Final    BOTTLES DRAWN AEROBIC AND ANAEROBIC Blood Culture adequate volume Performed at [facility], [address], [name], [place] [address]    Culture   Final    NO GROWTH < 24 HOURS Performed at [facility], [address], [place], [place] [address]    Report Status PENDING  Incomplete  Culture, blood (routine x 2)     Status: None (Preliminary result)   Collection Time: 05/10/18  3:13 PM  Result Value Ref Range Status   Specimen Description   Final    BLOOD RIGHT FOREARM Performed at [facility], [address] [place], [place] [address]    Special Requests   Final    BOTTLES DRAWN AEROBIC AND ANAEROBIC Blood Culture results may not be optimal due to an inadequate volume  of blood received in culture bottles Performed at [facility], [address], [place], [place] [address]    Culture   Final    NO GROWTH < 24 HOURS Performed at [facility], [address], [place], [place] [address]    Report Status PENDING  Incomplete  MRSA PCR Screening     Status: None   Collection Time: 05/10/18  7:37 PM  Result Value Ref Range Status   MRSA by PCR NEGATIVE NEGATIVE Final    Comment:        The GeneXpert MRSA Assay (FDA approved for NASAL specimens only), is one component of a comprehensive MRSA colonization surveillance program. It is not intended to diagnose MRSA infection nor to guide or monitor treatment for MRSA infections. Performed at [facility], [address], [place], [place] [address]      Labs: BNP (last 3 results) No results for input(s): BNP in the last 8760 hours. Basic Metabolic Panel: Recent Labs  Lab 05/10/18 1224 05/11/18 0506  NA 137 140  K 3.6 3.5  CL 94* 102  CO2 31 32  GLUCOSE 264* 216*  BUN 14 15  CREATININE 0.81 0.89  CALCIUM 10.1 9.3   Liver Function  Tests: Recent Labs  Lab 05/10/18 1224  AST 51*  ALT 49  ALKPHOS 140*  BILITOT 0.8  PROT 7.0  ALBUMIN 3.7   Recent Labs  Lab 05/10/18 1250  LIPASE 20   No results for input(s): AMMONIA in the last 168 hours. CBC: Recent Labs  Lab 05/10/18 1224 05/11/18 0506 05/12/18 0532  WBC 7.0 6.6 6.1  NEUTROABS 4.2  --   --   HGB 15.1* 13.8 13.4  HCT 43.6 42.0 40.3  MCV 92.6 95.0 95.5  PLT 193 177 161   Cardiac Enzymes: No results for input(s): CKTOTAL, CKMB, CKMBINDEX, TROPONINI in the last 168 hours. BNP: Invalid input(s): POCBNP CBG: Recent Labs  Lab 05/11/18 0732 05/11/18 1201 05/11/18 1619 05/11/18 2211 05/12/18 0750  GLUCAP 215* 329* 241* 237* 257*   D-Dimer No results for input(s): DDIMER in the last 72 hours. Hgb A1c No results for input(s): HGBA1C in the last 72 hours. Lipid Profile No results for input(s): CHOL, HDL, LDLCALC, TRIG, CHOLHDL, LDLDIRECT in the last 72 hours. Thyroid function studies No results for input(s): TSH, T4TOTAL, T3FREE, THYROIDAB in the last 72 hours.  Invalid input(s): FREET3 Anemia work up No results for input(s): VITAMINB12, FOLATE, FERRITIN, TIBC, IRON, RETICCTPCT in the last 72 hours. Urinalysis    Component Value Date/Time   COLORURINE YELLOW 09/18/2015 1928   APPEARANCEUR CLEAR 09/18/2015 1928   LABSPEC 1.023 09/18/2015 1928   PHURINE 7.0 09/18/2015 1928   GLUCOSEU NEGATIVE 09/18/2015 1928   HGBUR NEGATIVE 09/18/2015 1928   BILIRUBINUR NEGATIVE 09/18/2015 1928   KETONESUR NEGATIVE 09/18/2015 1928   PROTEINUR NEGATIVE 09/18/2015 1928   UROBILINOGEN 0.2 09/18/2015 1928   NITRITE NEGATIVE 09/18/2015 1928   LEUKOCYTESUR NEGATIVE 09/18/2015 1928   Sepsis Labs Invalid input(s): PROCALCITONIN,  WBC,  LACTICIDVEN Microbiology Recent Results (from the past 240 hour(s))  Culture, blood (routine x 2)     Status: None (Preliminary result)   Collection Time: 05/10/18 12:51 PM  Result Value Ref Range Status   Specimen  Description   Final    BLOOD LEFT ANTECUBITAL Performed at [facility], [address] [place], [place] [address]    Special Requests   Final    BOTTLES DRAWN AEROBIC AND ANAEROBIC  Blood Culture adequate volume Performed at [facility], [address] [place], [place] [address]    Culture   Final    NO GROWTH < 24 HOURS Performed at [facility], [address] [place], [place] [address]    Report Status PENDING  Incomplete  Culture, blood (routine x 2)     Status: None (Preliminary result)   Collection Time: 05/10/18  3:13 PM  Result Value Ref Range Status   Specimen Description   Final    BLOOD RIGHT FOREARM Performed at [facility], [address], [place], [place] [address]    Special Requests   Final    BOTTLES DRAWN AEROBIC AND ANAEROBIC Blood Culture results may not be optimal due to an inadequate volume of blood received in culture bottles Performed at [facility], [address] [place], [place] [address]    Culture   Final    NO GROWTH < 24 HOURS Performed at [facility], [address] [place], [place] [address]    Report Status PENDING  Incomplete  MRSA PCR Screening     Status: None   Collection Time: 05/10/18  7:37 PM  Result Value Ref Range Status   MRSA by PCR NEGATIVE NEGATIVE Final    Comment:        The GeneXpert MRSA Assay (FDA approved for NASAL specimens only), is one component of a comprehensive MRSA colonization surveillance program. It is not intended to diagnose MRSA infection nor to guide or monitor treatment for MRSA infections. Performed at [facility], [address] [place], [place] [address]      Time coordinating discharge: 35 minutes  SIGNED:   Marinda Elk, MD  [facility] 05/12/2018, 10:03 AM Pager   If 7PM-7AM, please contact night-coverage [email or web address] Password [email or web address]

## 2018-05-12 NOTE — Progress Notes (Signed)
Patient retuning to [facility].  D/C summary and FL2 sent to: [phone]  Patient will transport by facility-Michael Soil scientistcottage director . Daughter-Linda aware and informed at bedside.   Vivi BarrackNicole Ananya Mccleese, Theresia MajorsLCSWA, MSW Clinical Social Worker  [phone] 05/12/2018  10:22 AM

## 2018-05-12 NOTE — Progress Notes (Signed)
Pt discharged from the unit via wheelchair. Transport from [facility] transported pt back to facility. Discharge instructions were reviewed with daughter in law and POA. Called [facility], they do not need report given. No questions or concerns at this time.

## 2018-05-12 NOTE — Plan of Care (Signed)
Progressing

## 2018-05-15 DIAGNOSIS — J159 Unspecified bacterial pneumonia: Secondary | ICD-10-CM | POA: Diagnosis not present

## 2018-05-15 LAB — CULTURE, BLOOD (ROUTINE X 2)
Culture: NO GROWTH
Culture: NO GROWTH
Special Requests: ADEQUATE

## 2018-05-20 DIAGNOSIS — J449 Chronic obstructive pulmonary disease, unspecified: Secondary | ICD-10-CM | POA: Diagnosis not present

## 2018-05-20 DIAGNOSIS — R062 Wheezing: Secondary | ICD-10-CM | POA: Diagnosis not present

## 2018-05-22 DIAGNOSIS — R112 Nausea with vomiting, unspecified: Secondary | ICD-10-CM | POA: Diagnosis not present

## 2018-05-22 DIAGNOSIS — R4702 Dysphasia: Secondary | ICD-10-CM | POA: Diagnosis not present

## 2018-05-25 ENCOUNTER — Non-Acute Institutional Stay: Payer: Medicare Other | Admitting: Hospice and Palliative Medicine

## 2018-05-25 DIAGNOSIS — Z515 Encounter for palliative care: Secondary | ICD-10-CM | POA: Diagnosis not present

## 2018-05-26 NOTE — Progress Notes (Signed)
PALLIATIVE CARE CONSULT VISIT   PATIENT NAME: Katie RichesMildred Schillinger Haecker DOB: 04/28/1920 MRN: [id number]  PRIMARY CARE PROVIDER: Murrell Reddenhu Nguyen, NP of facility  REFERRING PROVIDER: Merri BrunettePharr, Walter, MD [address] [place], [place] [id number]  RESPONSIBLE PARTY:  Ex daughter n law and granddaughter    RECOMMENDATIONS and PLAN:  1. Dysphagia: would recommend to follow ST recommendations. Downgrade food consistency if needed. Observed feedings at all times.  2. Lethargy: possibly due to age/disease progression and/or possibly uncontrolled blood glucose levels. I would monitor/treat blood glucose levels more closely if able.  3.Generalized weakness: unable to safely transfer patient in this setting. She is unable to stand and pivot. She needs total care. She continues with caregiver at bedside and remains in memory care. She might need skilled care [facility] and closer monitoring of blood glucose levels more appropriate.  4. ACP: continues with DNR form on the chart. Call caregivers prior to any hospitalizations. Will [facility] to [facility] to discuss MOST form.  I spent 40 minutes providing this consultation,  from 12:20  to 1pm. More than 50% of the time in this consultation was spent interviewing staff and assessing patient and coordinating communication.   HISTORY OF PRESENT ILLNESS:  Katie Mcfarland is a 84 y.o.  female with multiple medical problems including DM, dementia, HF, previous CVA and muscle weakness; Recent hospitalization for aspiration pna. Palliative Care was asked to help address symptom management and goals of care as patient has returned to the memory care/AL setting.   CODE STATUS: DNR  PPS: 30% HOSPICE ELIGIBILITY/DIAGNOSIS: TBD  PAST MEDICAL HISTORY:  Past Medical History:  Diagnosis Date  . Congestive heart failure (CHF) (HCC)   . COPD (chronic obstructive pulmonary disease) (HCC)   . Dementia   . High cholesterol   . Hypertension     . Stroke (HCC)   . Vision abnormalities     SOCIAL HX:  Social History   Tobacco Use  . Smoking status: Never Smoker  . Smokeless tobacco: Never Used  Substance Use Topics  . Alcohol use: No    ALLERGIES: No Known Allergies   PERTINENT MEDICATIONS:  Outpatient Encounter Medications as of 05/25/2018  Medication Sig  . albuterol (PROAIR HFA) 108 (90 Base) MCG/ACT inhaler Inhale 2 puffs into the lungs every 6 (six) hours as needed for wheezing or shortness of breath.  Marland Kitchen. amLODipine (NORVASC) 5 MG tablet Take 5 mg by mouth daily.  Marland Kitchen. aspirin EC 81 MG tablet Take 81 mg by mouth daily.  Marland Kitchen. atorvastatin (LIPITOR) 40 MG tablet Take 1 tablet (40 mg total) by mouth daily at 6 PM.  . cholecalciferol (VITAMIN D) 1000 units tablet Take 2,000 Units by mouth daily.  . furosemide (LASIX) 20 MG tablet Take 20 mg by mouth daily.   Marland Kitchen. HYDROcodone-acetaminophen (NORCO/VICODIN) 5-325 MG tablet Take 0.5 tablets by mouth 3 (three) times daily.  . Ipratropium-Albuterol (COMBIVENT RESPIMAT) 20-100 MCG/ACT AERS respimat Inhale 1 puff into the lungs 4 (four) times daily.  Marland Kitchen. LANTUS SOLOSTAR 100 UNIT/ML Solostar Pen Inject 15 Units into the skin every morning.  . latanoprost (XALATAN) 0.005 % ophthalmic solution Place 1 drop into both eyes at bedtime.   . Melatonin 10 MG TABS Take 10 mg by mouth at bedtime.  Benson Setting. Mineral Oil Heavy OIL Place 2 drops into both ears every 7 (seven) days. Thursdays  . mirtazapine (REMERON) 15 MG tablet Take 0.5 tablets by mouth at bedtime.  . Multiple Vitamin (DAILY  VITE) TABS Take 1 tablet by mouth daily.  . polycarbophil (FIBERCON) 625 MG tablet Take 625 mg by mouth 2 (two) times daily.  . polyethylene glycol powder (GLYCOLAX/MIRALAX) powder Take 17 g by mouth once.  . polyvinyl alcohol (ARTIFICIAL TEARS) 1.4 % ophthalmic solution Place 2 drops into both eyes 4 (four) times daily.  . ranitidine (ZANTAC) 75 MG tablet Take 75 mg by mouth 2 (two) times daily.  Marland Kitchen senna-docusate  (SENOKOT-S) 8.6-50 MG tablet Take 1 tablet by mouth every other day.  . sertraline (ZOLOFT) 50 MG tablet Take 50 mg by mouth daily.   No facility-administered encounter medications on file as of 05/25/2018.     PHYSICAL EXAM:   General: frail, sleepy; + memory loss; +obesity Cardiovascular: irreg rhythm; reg rate Pulmonary: CTAB Abdomen: obese abdomen; soft, active BS Extremities: trace edema bilat lower legs; Unable to stand and pivot Skin: fragile Neurological: sleepy  Truett Perna, NP

## 2018-05-28 DIAGNOSIS — R1312 Dysphagia, oropharyngeal phase: Secondary | ICD-10-CM | POA: Diagnosis not present

## 2018-05-28 DIAGNOSIS — Z7982 Long term (current) use of aspirin: Secondary | ICD-10-CM | POA: Diagnosis not present

## 2018-05-28 DIAGNOSIS — E785 Hyperlipidemia, unspecified: Secondary | ICD-10-CM | POA: Diagnosis not present

## 2018-05-28 DIAGNOSIS — Z794 Long term (current) use of insulin: Secondary | ICD-10-CM | POA: Diagnosis not present

## 2018-05-28 DIAGNOSIS — I110 Hypertensive heart disease with heart failure: Secondary | ICD-10-CM | POA: Diagnosis not present

## 2018-05-28 DIAGNOSIS — J449 Chronic obstructive pulmonary disease, unspecified: Secondary | ICD-10-CM | POA: Diagnosis not present

## 2018-05-28 DIAGNOSIS — I5032 Chronic diastolic (congestive) heart failure: Secondary | ICD-10-CM | POA: Diagnosis not present

## 2018-05-28 DIAGNOSIS — H539 Unspecified visual disturbance: Secondary | ICD-10-CM | POA: Diagnosis not present

## 2018-05-28 DIAGNOSIS — E119 Type 2 diabetes mellitus without complications: Secondary | ICD-10-CM | POA: Diagnosis not present

## 2018-05-28 DIAGNOSIS — Z8673 Personal history of transient ischemic attack (TIA), and cerebral infarction without residual deficits: Secondary | ICD-10-CM | POA: Diagnosis not present

## 2018-05-28 DIAGNOSIS — J690 Pneumonitis due to inhalation of food and vomit: Secondary | ICD-10-CM | POA: Diagnosis not present

## 2018-05-28 DIAGNOSIS — Z9181 History of falling: Secondary | ICD-10-CM | POA: Diagnosis not present

## 2018-05-29 DIAGNOSIS — K219 Gastro-esophageal reflux disease without esophagitis: Secondary | ICD-10-CM | POA: Diagnosis not present

## 2018-05-29 DIAGNOSIS — R1110 Vomiting, unspecified: Secondary | ICD-10-CM | POA: Diagnosis not present

## 2018-05-29 DIAGNOSIS — R4702 Dysphasia: Secondary | ICD-10-CM | POA: Diagnosis not present

## 2018-05-29 DIAGNOSIS — E782 Mixed hyperlipidemia: Secondary | ICD-10-CM | POA: Diagnosis not present

## 2018-05-29 DIAGNOSIS — E1165 Type 2 diabetes mellitus with hyperglycemia: Secondary | ICD-10-CM | POA: Diagnosis not present

## 2018-05-30 DIAGNOSIS — I5032 Chronic diastolic (congestive) heart failure: Secondary | ICD-10-CM | POA: Diagnosis not present

## 2018-05-30 DIAGNOSIS — J690 Pneumonitis due to inhalation of food and vomit: Secondary | ICD-10-CM | POA: Diagnosis not present

## 2018-05-30 DIAGNOSIS — Z8673 Personal history of transient ischemic attack (TIA), and cerebral infarction without residual deficits: Secondary | ICD-10-CM | POA: Diagnosis not present

## 2018-05-30 DIAGNOSIS — E785 Hyperlipidemia, unspecified: Secondary | ICD-10-CM | POA: Diagnosis not present

## 2018-05-30 DIAGNOSIS — E119 Type 2 diabetes mellitus without complications: Secondary | ICD-10-CM | POA: Diagnosis not present

## 2018-05-30 DIAGNOSIS — J449 Chronic obstructive pulmonary disease, unspecified: Secondary | ICD-10-CM | POA: Diagnosis not present

## 2018-05-30 DIAGNOSIS — R1312 Dysphagia, oropharyngeal phase: Secondary | ICD-10-CM | POA: Diagnosis not present

## 2018-05-30 DIAGNOSIS — Z7982 Long term (current) use of aspirin: Secondary | ICD-10-CM | POA: Diagnosis not present

## 2018-05-30 DIAGNOSIS — H539 Unspecified visual disturbance: Secondary | ICD-10-CM | POA: Diagnosis not present

## 2018-05-30 DIAGNOSIS — I110 Hypertensive heart disease with heart failure: Secondary | ICD-10-CM | POA: Diagnosis not present

## 2018-05-30 DIAGNOSIS — Z9181 History of falling: Secondary | ICD-10-CM | POA: Diagnosis not present

## 2018-05-30 DIAGNOSIS — Z794 Long term (current) use of insulin: Secondary | ICD-10-CM | POA: Diagnosis not present

## 2018-05-31 DIAGNOSIS — Z79899 Other long term (current) drug therapy: Secondary | ICD-10-CM | POA: Diagnosis not present

## 2018-06-01 DIAGNOSIS — E782 Mixed hyperlipidemia: Secondary | ICD-10-CM | POA: Diagnosis not present

## 2018-06-01 DIAGNOSIS — E039 Hypothyroidism, unspecified: Secondary | ICD-10-CM | POA: Diagnosis not present

## 2018-06-01 DIAGNOSIS — E559 Vitamin D deficiency, unspecified: Secondary | ICD-10-CM | POA: Diagnosis not present

## 2018-06-01 DIAGNOSIS — I10 Essential (primary) hypertension: Secondary | ICD-10-CM | POA: Diagnosis not present

## 2018-06-01 DIAGNOSIS — E119 Type 2 diabetes mellitus without complications: Secondary | ICD-10-CM | POA: Diagnosis not present

## 2018-06-03 DIAGNOSIS — Z8673 Personal history of transient ischemic attack (TIA), and cerebral infarction without residual deficits: Secondary | ICD-10-CM | POA: Diagnosis not present

## 2018-06-05 DIAGNOSIS — E119 Type 2 diabetes mellitus without complications: Secondary | ICD-10-CM | POA: Diagnosis not present

## 2018-06-05 DIAGNOSIS — J690 Pneumonitis due to inhalation of food and vomit: Secondary | ICD-10-CM | POA: Diagnosis not present

## 2018-06-05 DIAGNOSIS — Z794 Long term (current) use of insulin: Secondary | ICD-10-CM | POA: Diagnosis not present

## 2018-06-05 DIAGNOSIS — H539 Unspecified visual disturbance: Secondary | ICD-10-CM | POA: Diagnosis not present

## 2018-06-05 DIAGNOSIS — R1312 Dysphagia, oropharyngeal phase: Secondary | ICD-10-CM | POA: Diagnosis not present

## 2018-06-05 DIAGNOSIS — Z9181 History of falling: Secondary | ICD-10-CM | POA: Diagnosis not present

## 2018-06-05 DIAGNOSIS — I5032 Chronic diastolic (congestive) heart failure: Secondary | ICD-10-CM | POA: Diagnosis not present

## 2018-06-05 DIAGNOSIS — G301 Alzheimer's disease with late onset: Secondary | ICD-10-CM | POA: Diagnosis not present

## 2018-06-05 DIAGNOSIS — G894 Chronic pain syndrome: Secondary | ICD-10-CM | POA: Diagnosis not present

## 2018-06-05 DIAGNOSIS — J449 Chronic obstructive pulmonary disease, unspecified: Secondary | ICD-10-CM | POA: Diagnosis not present

## 2018-06-05 DIAGNOSIS — E785 Hyperlipidemia, unspecified: Secondary | ICD-10-CM | POA: Diagnosis not present

## 2018-06-05 DIAGNOSIS — Z7982 Long term (current) use of aspirin: Secondary | ICD-10-CM | POA: Diagnosis not present

## 2018-06-05 DIAGNOSIS — Z8673 Personal history of transient ischemic attack (TIA), and cerebral infarction without residual deficits: Secondary | ICD-10-CM | POA: Diagnosis not present

## 2018-06-05 DIAGNOSIS — I110 Hypertensive heart disease with heart failure: Secondary | ICD-10-CM | POA: Diagnosis not present

## 2018-06-06 DIAGNOSIS — E785 Hyperlipidemia, unspecified: Secondary | ICD-10-CM | POA: Diagnosis not present

## 2018-06-06 DIAGNOSIS — R1312 Dysphagia, oropharyngeal phase: Secondary | ICD-10-CM | POA: Diagnosis not present

## 2018-06-06 DIAGNOSIS — E119 Type 2 diabetes mellitus without complications: Secondary | ICD-10-CM | POA: Diagnosis not present

## 2018-06-06 DIAGNOSIS — H539 Unspecified visual disturbance: Secondary | ICD-10-CM | POA: Diagnosis not present

## 2018-06-06 DIAGNOSIS — J690 Pneumonitis due to inhalation of food and vomit: Secondary | ICD-10-CM | POA: Diagnosis not present

## 2018-06-06 DIAGNOSIS — I5032 Chronic diastolic (congestive) heart failure: Secondary | ICD-10-CM | POA: Diagnosis not present

## 2018-06-06 DIAGNOSIS — Z8673 Personal history of transient ischemic attack (TIA), and cerebral infarction without residual deficits: Secondary | ICD-10-CM | POA: Diagnosis not present

## 2018-06-06 DIAGNOSIS — J449 Chronic obstructive pulmonary disease, unspecified: Secondary | ICD-10-CM | POA: Diagnosis not present

## 2018-06-06 DIAGNOSIS — I110 Hypertensive heart disease with heart failure: Secondary | ICD-10-CM | POA: Diagnosis not present

## 2018-06-06 DIAGNOSIS — Z9181 History of falling: Secondary | ICD-10-CM | POA: Diagnosis not present

## 2018-06-06 DIAGNOSIS — Z794 Long term (current) use of insulin: Secondary | ICD-10-CM | POA: Diagnosis not present

## 2018-06-06 DIAGNOSIS — Z7982 Long term (current) use of aspirin: Secondary | ICD-10-CM | POA: Diagnosis not present

## 2018-06-11 DIAGNOSIS — R1312 Dysphagia, oropharyngeal phase: Secondary | ICD-10-CM | POA: Diagnosis not present

## 2018-06-11 DIAGNOSIS — E119 Type 2 diabetes mellitus without complications: Secondary | ICD-10-CM | POA: Diagnosis not present

## 2018-06-11 DIAGNOSIS — Z7982 Long term (current) use of aspirin: Secondary | ICD-10-CM | POA: Diagnosis not present

## 2018-06-11 DIAGNOSIS — J449 Chronic obstructive pulmonary disease, unspecified: Secondary | ICD-10-CM | POA: Diagnosis not present

## 2018-06-11 DIAGNOSIS — Z794 Long term (current) use of insulin: Secondary | ICD-10-CM | POA: Diagnosis not present

## 2018-06-11 DIAGNOSIS — Z8673 Personal history of transient ischemic attack (TIA), and cerebral infarction without residual deficits: Secondary | ICD-10-CM | POA: Diagnosis not present

## 2018-06-11 DIAGNOSIS — Z9181 History of falling: Secondary | ICD-10-CM | POA: Diagnosis not present

## 2018-06-11 DIAGNOSIS — I110 Hypertensive heart disease with heart failure: Secondary | ICD-10-CM | POA: Diagnosis not present

## 2018-06-11 DIAGNOSIS — E785 Hyperlipidemia, unspecified: Secondary | ICD-10-CM | POA: Diagnosis not present

## 2018-06-11 DIAGNOSIS — J690 Pneumonitis due to inhalation of food and vomit: Secondary | ICD-10-CM | POA: Diagnosis not present

## 2018-06-11 DIAGNOSIS — H539 Unspecified visual disturbance: Secondary | ICD-10-CM | POA: Diagnosis not present

## 2018-06-11 DIAGNOSIS — I5032 Chronic diastolic (congestive) heart failure: Secondary | ICD-10-CM | POA: Diagnosis not present

## 2018-06-12 DIAGNOSIS — Q845 Enlarged and hypertrophic nails: Secondary | ICD-10-CM | POA: Diagnosis not present

## 2018-06-12 DIAGNOSIS — G301 Alzheimer's disease with late onset: Secondary | ICD-10-CM | POA: Diagnosis not present

## 2018-06-12 DIAGNOSIS — Z9181 History of falling: Secondary | ICD-10-CM | POA: Diagnosis not present

## 2018-06-12 DIAGNOSIS — Z7982 Long term (current) use of aspirin: Secondary | ICD-10-CM | POA: Diagnosis not present

## 2018-06-12 DIAGNOSIS — B351 Tinea unguium: Secondary | ICD-10-CM | POA: Diagnosis not present

## 2018-06-12 DIAGNOSIS — I5032 Chronic diastolic (congestive) heart failure: Secondary | ICD-10-CM | POA: Diagnosis not present

## 2018-06-12 DIAGNOSIS — R1312 Dysphagia, oropharyngeal phase: Secondary | ICD-10-CM | POA: Diagnosis not present

## 2018-06-12 DIAGNOSIS — I110 Hypertensive heart disease with heart failure: Secondary | ICD-10-CM | POA: Diagnosis not present

## 2018-06-12 DIAGNOSIS — H539 Unspecified visual disturbance: Secondary | ICD-10-CM | POA: Diagnosis not present

## 2018-06-12 DIAGNOSIS — L853 Xerosis cutis: Secondary | ICD-10-CM | POA: Diagnosis not present

## 2018-06-12 DIAGNOSIS — E119 Type 2 diabetes mellitus without complications: Secondary | ICD-10-CM | POA: Diagnosis not present

## 2018-06-12 DIAGNOSIS — M2010 Hallux valgus (acquired), unspecified foot: Secondary | ICD-10-CM | POA: Diagnosis not present

## 2018-06-12 DIAGNOSIS — J690 Pneumonitis due to inhalation of food and vomit: Secondary | ICD-10-CM | POA: Diagnosis not present

## 2018-06-12 DIAGNOSIS — E785 Hyperlipidemia, unspecified: Secondary | ICD-10-CM | POA: Diagnosis not present

## 2018-06-12 DIAGNOSIS — L603 Nail dystrophy: Secondary | ICD-10-CM | POA: Diagnosis not present

## 2018-06-12 DIAGNOSIS — J449 Chronic obstructive pulmonary disease, unspecified: Secondary | ICD-10-CM | POA: Diagnosis not present

## 2018-06-12 DIAGNOSIS — Z8673 Personal history of transient ischemic attack (TIA), and cerebral infarction without residual deficits: Secondary | ICD-10-CM | POA: Diagnosis not present

## 2018-06-12 DIAGNOSIS — Z794 Long term (current) use of insulin: Secondary | ICD-10-CM | POA: Diagnosis not present

## 2018-06-13 DIAGNOSIS — Z7982 Long term (current) use of aspirin: Secondary | ICD-10-CM | POA: Diagnosis not present

## 2018-06-13 DIAGNOSIS — J690 Pneumonitis due to inhalation of food and vomit: Secondary | ICD-10-CM | POA: Diagnosis not present

## 2018-06-13 DIAGNOSIS — I5032 Chronic diastolic (congestive) heart failure: Secondary | ICD-10-CM | POA: Diagnosis not present

## 2018-06-13 DIAGNOSIS — I110 Hypertensive heart disease with heart failure: Secondary | ICD-10-CM | POA: Diagnosis not present

## 2018-06-13 DIAGNOSIS — H539 Unspecified visual disturbance: Secondary | ICD-10-CM | POA: Diagnosis not present

## 2018-06-13 DIAGNOSIS — E119 Type 2 diabetes mellitus without complications: Secondary | ICD-10-CM | POA: Diagnosis not present

## 2018-06-13 DIAGNOSIS — Z794 Long term (current) use of insulin: Secondary | ICD-10-CM | POA: Diagnosis not present

## 2018-06-13 DIAGNOSIS — J449 Chronic obstructive pulmonary disease, unspecified: Secondary | ICD-10-CM | POA: Diagnosis not present

## 2018-06-13 DIAGNOSIS — R1312 Dysphagia, oropharyngeal phase: Secondary | ICD-10-CM | POA: Diagnosis not present

## 2018-06-13 DIAGNOSIS — Z8673 Personal history of transient ischemic attack (TIA), and cerebral infarction without residual deficits: Secondary | ICD-10-CM | POA: Diagnosis not present

## 2018-06-13 DIAGNOSIS — Z9181 History of falling: Secondary | ICD-10-CM | POA: Diagnosis not present

## 2018-06-13 DIAGNOSIS — E785 Hyperlipidemia, unspecified: Secondary | ICD-10-CM | POA: Diagnosis not present

## 2018-06-14 ENCOUNTER — Telehealth: Payer: Self-pay

## 2018-06-14 NOTE — Telephone Encounter (Signed)
Spoke with Casimiro NeedleMichael at [facility] who requested a Palliative Care visit early next week. Visit scheduled.

## 2018-06-15 DIAGNOSIS — Z7982 Long term (current) use of aspirin: Secondary | ICD-10-CM | POA: Diagnosis not present

## 2018-06-15 DIAGNOSIS — E785 Hyperlipidemia, unspecified: Secondary | ICD-10-CM | POA: Diagnosis not present

## 2018-06-15 DIAGNOSIS — I5032 Chronic diastolic (congestive) heart failure: Secondary | ICD-10-CM | POA: Diagnosis not present

## 2018-06-15 DIAGNOSIS — I110 Hypertensive heart disease with heart failure: Secondary | ICD-10-CM | POA: Diagnosis not present

## 2018-06-15 DIAGNOSIS — R1312 Dysphagia, oropharyngeal phase: Secondary | ICD-10-CM | POA: Diagnosis not present

## 2018-06-15 DIAGNOSIS — J690 Pneumonitis due to inhalation of food and vomit: Secondary | ICD-10-CM | POA: Diagnosis not present

## 2018-06-15 DIAGNOSIS — H539 Unspecified visual disturbance: Secondary | ICD-10-CM | POA: Diagnosis not present

## 2018-06-15 DIAGNOSIS — Z8673 Personal history of transient ischemic attack (TIA), and cerebral infarction without residual deficits: Secondary | ICD-10-CM | POA: Diagnosis not present

## 2018-06-15 DIAGNOSIS — J449 Chronic obstructive pulmonary disease, unspecified: Secondary | ICD-10-CM | POA: Diagnosis not present

## 2018-06-15 DIAGNOSIS — E119 Type 2 diabetes mellitus without complications: Secondary | ICD-10-CM | POA: Diagnosis not present

## 2018-06-15 DIAGNOSIS — Z794 Long term (current) use of insulin: Secondary | ICD-10-CM | POA: Diagnosis not present

## 2018-06-15 DIAGNOSIS — Z9181 History of falling: Secondary | ICD-10-CM | POA: Diagnosis not present

## 2018-06-18 ENCOUNTER — Non-Acute Institutional Stay: Payer: Medicare Other | Admitting: Internal Medicine

## 2018-06-18 VITALS — BP 144/74 | HR 72 | Resp 16 | Ht 60.0 in | Wt 166.0 lb

## 2018-06-18 DIAGNOSIS — I69391 Dysphagia following cerebral infarction: Secondary | ICD-10-CM

## 2018-06-18 DIAGNOSIS — R531 Weakness: Secondary | ICD-10-CM

## 2018-06-18 DIAGNOSIS — I5032 Chronic diastolic (congestive) heart failure: Secondary | ICD-10-CM | POA: Diagnosis not present

## 2018-06-18 DIAGNOSIS — E119 Type 2 diabetes mellitus without complications: Secondary | ICD-10-CM | POA: Diagnosis not present

## 2018-06-18 DIAGNOSIS — Z515 Encounter for palliative care: Secondary | ICD-10-CM

## 2018-06-18 DIAGNOSIS — R52 Pain, unspecified: Secondary | ICD-10-CM

## 2018-06-18 DIAGNOSIS — J690 Pneumonitis due to inhalation of food and vomit: Secondary | ICD-10-CM | POA: Diagnosis not present

## 2018-06-18 DIAGNOSIS — I110 Hypertensive heart disease with heart failure: Secondary | ICD-10-CM | POA: Diagnosis not present

## 2018-06-18 DIAGNOSIS — E785 Hyperlipidemia, unspecified: Secondary | ICD-10-CM | POA: Diagnosis not present

## 2018-06-18 DIAGNOSIS — H539 Unspecified visual disturbance: Secondary | ICD-10-CM | POA: Diagnosis not present

## 2018-06-18 DIAGNOSIS — Z7982 Long term (current) use of aspirin: Secondary | ICD-10-CM | POA: Diagnosis not present

## 2018-06-18 DIAGNOSIS — R1312 Dysphagia, oropharyngeal phase: Secondary | ICD-10-CM | POA: Diagnosis not present

## 2018-06-18 DIAGNOSIS — Z9181 History of falling: Secondary | ICD-10-CM | POA: Diagnosis not present

## 2018-06-18 DIAGNOSIS — F0151 Vascular dementia with behavioral disturbance: Secondary | ICD-10-CM

## 2018-06-18 DIAGNOSIS — E1065 Type 1 diabetes mellitus with hyperglycemia: Secondary | ICD-10-CM

## 2018-06-18 DIAGNOSIS — J449 Chronic obstructive pulmonary disease, unspecified: Secondary | ICD-10-CM | POA: Diagnosis not present

## 2018-06-18 DIAGNOSIS — Z8673 Personal history of transient ischemic attack (TIA), and cerebral infarction without residual deficits: Secondary | ICD-10-CM | POA: Diagnosis not present

## 2018-06-18 DIAGNOSIS — F01518 Vascular dementia, unspecified severity, with other behavioral disturbance: Secondary | ICD-10-CM

## 2018-06-18 DIAGNOSIS — Z794 Long term (current) use of insulin: Secondary | ICD-10-CM | POA: Diagnosis not present

## 2018-06-18 NOTE — Progress Notes (Signed)
PALLIATIVE CARE CONSULT VISIT   PATIENT NAME: Katie Mcfarland DOB: 02-20-1920 MRN: [id number]  PRIMARY CARE PROVIDER:   Murrell Redden, NP of facility REFERRING PROVIDER:  Merri Brunette, MD [address] [place], [place] [address]  RESPONSIBLE PARTY: daughter n law  Gerilyn Pilgrim POA [phone]. Granddaughter Baxter Hire [phone] Resident for 2.5 years, [place] hired sitter 3 months ([employer]) -[facility] Memory Care coordinator Casimiro Needle Mabe [phone])  IMPRESSION/RECOMMENDATIONS:   1.Dysphagia with evidence ongoing aspiration; recent hospitalization for aspiration pneumonia -Strong episodic cough exacerbated with eating textured foods. Patient refusing dysphagia/modified diet. Family requesting comfort feeds.  a. Discussed (with sitter) feeding techniques to decrease aspiration risk (slow feeds/small bites/swallow food before another bite offered/sitting straight up, etc).  2. Weakness: progressive. No longer able to weight bear. Transfers with 2 person Risk manager. Strength decreased L > R due to prior CVA.   3. Dementia (FAST 6e): Dependent for hygiene and transfers. Incontinent bowel/bladder. Engaging and conversant but with confusion Oriented to self and place. Developed episodic visual/audio hallucinations; Mirtazapine increased from 15 to 30mg  qd but exacerbation of symptoms. Mirtazapine discontinued and symptoms improved though continue with decreased frequency (non-bothersome to patient). Started on Trazodone 25mg  qhs; continues Zoloft 50mg  qd.  4. DM: Fasting blood sugars range low 100's to 300's. Continues SS Humalog (BS>400), Lantus Solostar Pen 15u qam.  5.Generalized pain: Long term use hydrocodone-acetaminophen 5-325mg , 0.5 tab tid.   6.ACP/FU: DNR in place. Per [facility] coordinator Casimiro Needle Mabe [phone]) , [facility] wishes to avoid further hospitalizations, and asking for evaluation for hospice services. Casimiro Needle will  pass hospice referral request to facility NP Murrell Redden, who will be rounding in the am. I left voice mail message on D-I-L's cell Gerilyn Pilgrim POA [phone]) to update.    I spent 40 minutes providing this consultation, from 2pm-2:40pm. More than 50% of the time in spent interviewing staff, assessing patient, and coordinating communication.    HISTORY OF PRESENT ILLNESS:  Katie Mcfarland is a 84 y.o.  female with history significant for prior CVA, ongoing dysphagia, dementia, and DM. She is s/p hospital admission for aspiration pneumonia last month.  Speech Therapy had recommended modified dysphagia diet, but patient is refusing/intolerant. Family (ex D-I-L / HCPOA) wishing to continue comfort feeds;  wishes to avoid future hospitalizations should patient have recurrent aspiration. DNR in place. Palliative Care asked to visit; assess for hospice eligibility.  CODE STATUS: DNR  PPS: 30% HOSPICE ELIGIBILITY/DIAGNOSIS: Yes; referral recommended from facility NP   PAST MEDICAL HISTORY:  Past Medical History:  Diagnosis Date  . Congestive heart failure (CHF) (HCC)   . COPD (chronic obstructive pulmonary disease) (HCC)   . Dementia   . High cholesterol   . Hypertension   . Stroke (HCC)   . Vision abnormalities     SOCIAL HX:  Social History   Tobacco Use  . Smoking status: Never Smoker  . Smokeless tobacco: Never Used  Substance Use Topics  . Alcohol use: No    ALLERGIES: No Known Allergies   PERTINENT MEDICATIONS:  Outpatient Encounter Medications as of 06/18/2018  Medication Sig  . acetaminophen (TYLENOL) 500 MG tablet Take 500 mg by mouth every 8 (eight) hours as needed.  Marland Kitchen albuterol (PROAIR HFA) 108 (90 Base) MCG/ACT inhaler Inhale 2 puffs into the lungs every 6 (six) hours as needed for wheezing or shortness of breath.  Marland Kitchen amLODipine (NORVASC) 5 MG tablet Take 5 mg by mouth daily.  Marland Kitchen  aspirin EC 81 MG tablet Take 81 mg by mouth daily.  Marland Kitchen. atorvastatin  (LIPITOR) 40 MG tablet Take 1 tablet (40 mg total) by mouth daily at 6 PM.  . cholecalciferol (VITAMIN D) 1000 units tablet Take 2,000 Units by mouth daily.  . furosemide (LASIX) 20 MG tablet Take 20 mg by mouth daily.   Marland Kitchen. HYDROcodone-acetaminophen (NORCO/VICODIN) 5-325 MG tablet Take 0.5 tablets by mouth 3 (three) times daily.  . insulin lispro (HUMALOG) 100 UNIT/ML injection Inject 5 Units into the skin once.  . Ipratropium-Albuterol (COMBIVENT RESPIMAT) 20-100 MCG/ACT AERS respimat Inhale 1 puff into the lungs 4 (four) times daily.  Marland Kitchen. LANTUS SOLOSTAR 100 UNIT/ML Solostar Pen Inject 15 Units into the skin every morning.  . latanoprost (XALATAN) 0.005 % ophthalmic solution Place 1 drop into both eyes at bedtime.   . Melatonin 10 MG TABS Take 10 mg by mouth at bedtime.  Benson Setting. Mineral Oil Heavy OIL Place 2 drops into both ears every 7 (seven) days. Thursdays  . Multiple Vitamin (DAILY VITE) TABS Take 1 tablet by mouth daily.  . ondansetron (ZOFRAN) 4 MG tablet Take 4 mg by mouth every 6 (six) hours as needed for nausea or vomiting.  . polycarbophil (FIBERCON) 625 MG tablet Take 625 mg by mouth 2 (two) times daily.  . polyethylene glycol powder (GLYCOLAX/MIRALAX) powder Take 17 g by mouth once.  . polyvinyl alcohol (ARTIFICIAL TEARS) 1.4 % ophthalmic solution Place 2 drops into both eyes 4 (four) times daily.  . ranitidine (ZANTAC) 75 MG tablet Take 75 mg by mouth 2 (two) times daily.  Marland Kitchen. senna-docusate (SENOKOT-S) 8.6-50 MG tablet Take 1 tablet by mouth every other day.  . sertraline (ZOLOFT) 50 MG tablet Take 50 mg by mouth daily.  . traZODone (DESYREL) 50 MG tablet Take 24 mg by mouth at bedtime.  . mirtazapine (REMERON) 15 MG tablet Take 0.5 tablets by mouth at bedtime.   No facility-administered encounter medications on file as of 06/18/2018.     PHYSICAL EXAM:  Well nourished, elderly female sitting up in the wheelchair. Eating a cookie with subsequent coughing spell. Pleasantly conversant; A  & O to self/place.  Cardiovascular: regular rate and rhythm without MRG. Peripheral pulses intact. Pulmonary: clear ant fields Abdomen: soft, nontender, non-distended, + bowel sounds Extremities: no edema, no contractures Skin: no breakdown Neurological: Generalized; mild decreased strength L>R. No facial droop.  Anselm LisMary P Lakeasha Petion, NP

## 2018-06-19 ENCOUNTER — Encounter: Payer: Self-pay | Admitting: Internal Medicine

## 2018-06-19 DIAGNOSIS — Z7982 Long term (current) use of aspirin: Secondary | ICD-10-CM | POA: Diagnosis not present

## 2018-06-19 DIAGNOSIS — I110 Hypertensive heart disease with heart failure: Secondary | ICD-10-CM | POA: Diagnosis not present

## 2018-06-19 DIAGNOSIS — G301 Alzheimer's disease with late onset: Secondary | ICD-10-CM | POA: Diagnosis not present

## 2018-06-19 DIAGNOSIS — E785 Hyperlipidemia, unspecified: Secondary | ICD-10-CM | POA: Diagnosis not present

## 2018-06-19 DIAGNOSIS — R1312 Dysphagia, oropharyngeal phase: Secondary | ICD-10-CM | POA: Diagnosis not present

## 2018-06-19 DIAGNOSIS — Z9181 History of falling: Secondary | ICD-10-CM | POA: Diagnosis not present

## 2018-06-19 DIAGNOSIS — E119 Type 2 diabetes mellitus without complications: Secondary | ICD-10-CM | POA: Diagnosis not present

## 2018-06-19 DIAGNOSIS — Z8673 Personal history of transient ischemic attack (TIA), and cerebral infarction without residual deficits: Secondary | ICD-10-CM | POA: Diagnosis not present

## 2018-06-19 DIAGNOSIS — E1165 Type 2 diabetes mellitus with hyperglycemia: Secondary | ICD-10-CM | POA: Diagnosis not present

## 2018-06-19 DIAGNOSIS — H539 Unspecified visual disturbance: Secondary | ICD-10-CM | POA: Diagnosis not present

## 2018-06-19 DIAGNOSIS — J449 Chronic obstructive pulmonary disease, unspecified: Secondary | ICD-10-CM | POA: Diagnosis not present

## 2018-06-19 DIAGNOSIS — R1310 Dysphagia, unspecified: Secondary | ICD-10-CM | POA: Diagnosis not present

## 2018-06-19 DIAGNOSIS — J690 Pneumonitis due to inhalation of food and vomit: Secondary | ICD-10-CM | POA: Diagnosis not present

## 2018-06-19 DIAGNOSIS — I5032 Chronic diastolic (congestive) heart failure: Secondary | ICD-10-CM | POA: Diagnosis not present

## 2018-06-19 DIAGNOSIS — R062 Wheezing: Secondary | ICD-10-CM | POA: Diagnosis not present

## 2018-06-19 DIAGNOSIS — E782 Mixed hyperlipidemia: Secondary | ICD-10-CM | POA: Diagnosis not present

## 2018-06-19 DIAGNOSIS — Z794 Long term (current) use of insulin: Secondary | ICD-10-CM | POA: Diagnosis not present

## 2018-06-20 DIAGNOSIS — I5032 Chronic diastolic (congestive) heart failure: Secondary | ICD-10-CM | POA: Diagnosis not present

## 2018-06-20 DIAGNOSIS — Z9181 History of falling: Secondary | ICD-10-CM | POA: Diagnosis not present

## 2018-06-20 DIAGNOSIS — Z8673 Personal history of transient ischemic attack (TIA), and cerebral infarction without residual deficits: Secondary | ICD-10-CM | POA: Diagnosis not present

## 2018-06-20 DIAGNOSIS — J690 Pneumonitis due to inhalation of food and vomit: Secondary | ICD-10-CM | POA: Diagnosis not present

## 2018-06-20 DIAGNOSIS — Z7982 Long term (current) use of aspirin: Secondary | ICD-10-CM | POA: Diagnosis not present

## 2018-06-20 DIAGNOSIS — E785 Hyperlipidemia, unspecified: Secondary | ICD-10-CM | POA: Diagnosis not present

## 2018-06-20 DIAGNOSIS — E119 Type 2 diabetes mellitus without complications: Secondary | ICD-10-CM | POA: Diagnosis not present

## 2018-06-20 DIAGNOSIS — R1312 Dysphagia, oropharyngeal phase: Secondary | ICD-10-CM | POA: Diagnosis not present

## 2018-06-20 DIAGNOSIS — H539 Unspecified visual disturbance: Secondary | ICD-10-CM | POA: Diagnosis not present

## 2018-06-20 DIAGNOSIS — Z794 Long term (current) use of insulin: Secondary | ICD-10-CM | POA: Diagnosis not present

## 2018-06-20 DIAGNOSIS — I110 Hypertensive heart disease with heart failure: Secondary | ICD-10-CM | POA: Diagnosis not present

## 2018-06-20 DIAGNOSIS — J449 Chronic obstructive pulmonary disease, unspecified: Secondary | ICD-10-CM | POA: Diagnosis not present

## 2018-06-26 DIAGNOSIS — G301 Alzheimer's disease with late onset: Secondary | ICD-10-CM | POA: Diagnosis not present

## 2018-06-29 DIAGNOSIS — G301 Alzheimer's disease with late onset: Secondary | ICD-10-CM | POA: Diagnosis not present

## 2018-06-29 DIAGNOSIS — F5109 Other insomnia not due to a substance or known physiological condition: Secondary | ICD-10-CM | POA: Diagnosis not present

## 2018-07-03 DIAGNOSIS — Z8673 Personal history of transient ischemic attack (TIA), and cerebral infarction without residual deficits: Secondary | ICD-10-CM | POA: Diagnosis not present

## 2018-07-10 DIAGNOSIS — G894 Chronic pain syndrome: Secondary | ICD-10-CM | POA: Diagnosis not present

## 2018-07-16 DIAGNOSIS — Z8673 Personal history of transient ischemic attack (TIA), and cerebral infarction without residual deficits: Secondary | ICD-10-CM | POA: Diagnosis not present

## 2018-07-16 DIAGNOSIS — M25561 Pain in right knee: Secondary | ICD-10-CM | POA: Diagnosis not present

## 2018-07-17 DIAGNOSIS — G301 Alzheimer's disease with late onset: Secondary | ICD-10-CM | POA: Diagnosis not present

## 2018-07-20 DIAGNOSIS — J449 Chronic obstructive pulmonary disease, unspecified: Secondary | ICD-10-CM | POA: Diagnosis not present

## 2018-07-20 DIAGNOSIS — R062 Wheezing: Secondary | ICD-10-CM | POA: Diagnosis not present

## 2018-07-26 DIAGNOSIS — Z79899 Other long term (current) drug therapy: Secondary | ICD-10-CM | POA: Diagnosis not present

## 2018-08-07 DIAGNOSIS — G894 Chronic pain syndrome: Secondary | ICD-10-CM | POA: Diagnosis not present

## 2018-08-14 DIAGNOSIS — L603 Nail dystrophy: Secondary | ICD-10-CM | POA: Diagnosis not present

## 2018-08-14 DIAGNOSIS — M2010 Hallux valgus (acquired), unspecified foot: Secondary | ICD-10-CM | POA: Diagnosis not present

## 2018-08-14 DIAGNOSIS — B351 Tinea unguium: Secondary | ICD-10-CM | POA: Diagnosis not present

## 2018-08-14 DIAGNOSIS — I739 Peripheral vascular disease, unspecified: Secondary | ICD-10-CM | POA: Diagnosis not present

## 2018-08-14 DIAGNOSIS — E119 Type 2 diabetes mellitus without complications: Secondary | ICD-10-CM | POA: Diagnosis not present

## 2018-08-16 DIAGNOSIS — Z8673 Personal history of transient ischemic attack (TIA), and cerebral infarction without residual deficits: Secondary | ICD-10-CM | POA: Diagnosis not present

## 2018-08-20 DIAGNOSIS — J449 Chronic obstructive pulmonary disease, unspecified: Secondary | ICD-10-CM | POA: Diagnosis not present

## 2018-08-28 DIAGNOSIS — G301 Alzheimer's disease with late onset: Secondary | ICD-10-CM | POA: Diagnosis not present

## 2018-08-29 DIAGNOSIS — R600 Localized edema: Secondary | ICD-10-CM | POA: Diagnosis not present

## 2018-08-29 DIAGNOSIS — R1110 Vomiting, unspecified: Secondary | ICD-10-CM | POA: Diagnosis not present

## 2018-08-29 DIAGNOSIS — I10 Essential (primary) hypertension: Secondary | ICD-10-CM | POA: Diagnosis not present

## 2018-08-29 DIAGNOSIS — R0602 Shortness of breath: Secondary | ICD-10-CM | POA: Diagnosis not present

## 2018-08-29 DIAGNOSIS — E1165 Type 2 diabetes mellitus with hyperglycemia: Secondary | ICD-10-CM | POA: Diagnosis not present

## 2018-09-04 DIAGNOSIS — G894 Chronic pain syndrome: Secondary | ICD-10-CM | POA: Diagnosis not present

## 2018-09-11 DIAGNOSIS — Z79899 Other long term (current) drug therapy: Secondary | ICD-10-CM | POA: Diagnosis not present

## 2018-09-19 DIAGNOSIS — R062 Wheezing: Secondary | ICD-10-CM | POA: Diagnosis not present

## 2018-09-19 DIAGNOSIS — J449 Chronic obstructive pulmonary disease, unspecified: Secondary | ICD-10-CM | POA: Diagnosis not present

## 2018-11-21 DIAGNOSIS — Z79899 Other long term (current) drug therapy: Secondary | ICD-10-CM | POA: Diagnosis not present

## 2018-12-10 ENCOUNTER — Non-Acute Institutional Stay: Payer: Medicare Other | Admitting: Internal Medicine

## 2018-12-10 ENCOUNTER — Encounter: Payer: Self-pay | Admitting: Internal Medicine

## 2018-12-10 VITALS — BP 130/68 | HR 64 | Resp 24

## 2018-12-10 DIAGNOSIS — Z515 Encounter for palliative care: Secondary | ICD-10-CM

## 2018-12-10 NOTE — Progress Notes (Signed)
[facility] Telephone: [phone] Fax: [phone]  PATIENT NAME: Katie Mcfarland DOB: 05-Aug-1920 MRN: [id number]  PRIMARY CARE PROVIDER:   Merri Brunette, MD  REFERRING PROVIDER:  Georges Lynch Thi PA [facility] RESPONSIBLE PARTY:  (dtr) Gerilyn Pilgrim [phone]. (grand child) [phone] Araceli Bouche  HISTORY OF PRESENT ILLNESS:  Katie Mcfarland is a 85 y.o. year old female with medical h/o Alzheimer's (FAST 7c), CVA, HTN, diabetes, HLD, COPD, CHF, depression, dysphagia, and aspiration pneumonia,. She was a hospice patient for the prior 7 months, and was recently discharged due to medical stability.  Palliative Care was asked to continue following patient, with ongoing assessment for signs of decline.   RECOMMENDATIONS and PLAN:  1. Alzheimer dementia: Patient's FAST score is 7c. She is able to converse with short sentences. Alert and oriented X 3. Combative and resistant to personal care.She is dependent for transfers (2 person), hygiene, and dressing. She is incontinent of bowel and bladder. Though thought to be a high aspiration risk due to dysphagia, her family wished an upgrade in her diet to regular diet. Occasional cough when eating. Oral intake is 50-100%. Her December weight is 171 lbs, up 8.2lbs over the previous 2 months. Her BMI is 38.3 kg/m2. Sitter 8am to 6pm.    2. Advanced Care Planning: DNR on chart  3. F/U NP visit every 2-3 months. I left a phone message with patient's daughter Bonita Quin with my contact information, for her to call me for visit updates or with any questions or concerns.   I spent 45 minutes providing this consultation,  from 1pm to 1:45pm. More than 50% of the time in this consultation was spent coordinating communication.   CODE STATUS: DNR  PPS: 30% HOSPICE ELIGIBILITY/DIAGNOSIS: TBD  PAST MEDICAL HISTORY:  Past Medical History:  Diagnosis Date  . Congestive heart failure (CHF) (HCC)   . COPD  (chronic obstructive pulmonary disease) (HCC)   . Dementia (HCC)   . High cholesterol   . Hypertension   . Stroke (HCC)   . Vision abnormalities     SOCIAL HX:  Social History   Tobacco Use  . Smoking status: Never Smoker  . Smokeless tobacco: Never Used  Substance Use Topics  . Alcohol use: No    ALLERGIES: No Known Allergies   PERTINENT MEDICATIONS:  Outpatient Encounter Medications as of 12/10/2018  Medication Sig  . acetaminophen (TYLENOL) 500 MG tablet Take 500 mg by mouth every 8 (eight) hours as needed.  Marland Kitchen albuterol (PROAIR HFA) 108 (90 Base) MCG/ACT inhaler Inhale 2 puffs into the lungs every 6 (six) hours as needed for wheezing or shortness of breath.  Marland Kitchen amLODipine (NORVASC) 5 MG tablet Take 5 mg by mouth daily.  Marland Kitchen aspirin EC 81 MG tablet Take 81 mg by mouth daily.  Marland Kitchen atorvastatin (LIPITOR) 40 MG tablet Take 1 tablet (40 mg total) by mouth daily at 6 PM.  . famotidine (PEPCID) 40 MG tablet Take 40 mg by mouth at bedtime.  . furosemide (LASIX) 20 MG tablet Take 20 mg by mouth daily.   Marland Kitchen HYDROcodone-acetaminophen (NORCO/VICODIN) 5-325 MG tablet Take 0.5 tablets by mouth 3 (three) times daily.  . insulin lispro (HUMALOG) 100 UNIT/ML injection Inject 5 Units into the skin once. As needed for BS> 450. Recheck in 1 hour. If not lowered notify MD  . Ipratropium-Albuterol (COMBIVENT RESPIMAT) 20-100 MCG/ACT AERS respimat Inhale 1 puff into the lungs 4 (four) times daily.  Marland Kitchen LANTUS SOLOSTAR 100 UNIT/ML  Solostar Pen Inject 25 Units into the skin every morning.   . latanoprost (XALATAN) 0.005 % ophthalmic solution Place 1 drop into both eyes at bedtime.   . Melatonin 10 MG TABS Take 10 mg by mouth at bedtime.  Benson Setting Oil Heavy OIL Place 2 drops into both ears every 7 (seven) days. Thursdays  . Multiple Vitamin (DAILY VITE) TABS Take 1 tablet by mouth daily.  . ondansetron (ZOFRAN) 4 MG tablet Take 4 mg by mouth every 6 (six) hours as needed for nausea or vomiting.  . polyethylene  glycol powder (GLYCOLAX/MIRALAX) powder Take 17 g by mouth once.  . polyvinyl alcohol (ARTIFICIAL TEARS) 1.4 % ophthalmic solution Place 2 drops into both eyes 4 (four) times daily.  Marland Kitchen senna-docusate (SENOKOT-S) 8.6-50 MG tablet Take 1 tablet by mouth every other day.  . sertraline (ZOLOFT) 50 MG tablet Take 50 mg by mouth daily.  . traZODone (DESYREL) 50 MG tablet Take 24 mg by mouth at bedtime.  . [DISCONTINUED] cholecalciferol (VITAMIN D) 1000 units tablet Take 2,000 Units by mouth daily.  . [DISCONTINUED] mirtazapine (REMERON) 15 MG tablet Take 0.5 tablets by mouth at bedtime.  . [DISCONTINUED] polycarbophil (FIBERCON) 625 MG tablet Take 625 mg by mouth 2 (two) times daily.  . [DISCONTINUED] ranitidine (ZANTAC) 75 MG tablet Take 75 mg by mouth 2 (two) times daily.   No facility-administered encounter medications on file as of 12/10/2018.     PHYSICAL EXAM:   General: Well nourished elderly female napping in the chair. She awoke easily to my voice, and was pleasantly conversant. A & O X 3.  Cardiovascular: regular rate and rhythm. Systolic murmur and split S2 Pulmonary: clear ant fields Abdomen: soft, nontender, + bowel sounds GU: no suprapubic tenderness Extremities: no edema, no joint deformities Skin: no rashes Neurological: Weakness but otherwise nonfocal  Anselm Lis, NP

## 2018-12-18 DIAGNOSIS — Q845 Enlarged and hypertrophic nails: Secondary | ICD-10-CM | POA: Diagnosis not present

## 2018-12-18 DIAGNOSIS — I739 Peripheral vascular disease, unspecified: Secondary | ICD-10-CM | POA: Diagnosis not present

## 2018-12-18 DIAGNOSIS — B351 Tinea unguium: Secondary | ICD-10-CM | POA: Diagnosis not present

## 2019-01-07 IMAGING — MR MR HEAD W/O CM
9 of 11 series · 30 of 48 positions shown · non-contrast
Comparison: Head CT same day and 09/18/2015

CLINICAL DATA: Dementia. Acute presentation with left-sided
weakness.

EXAM:
MRI HEAD WITHOUT CONTRAST
MRA HEAD WITHOUT CONTRAST
TECHNIQUE: Multiplanar, multiecho pulse sequences of the brain and surrounding
structures were obtained without intravenous contrast. Angiographic
images of the head were obtained using MRA technique without
contrast.

[Series 2: FLAIR · sagittal · 5.0mm · 0.47mm/px · 1 of 23 slices shown (1 of 2)]
[im 1/23]
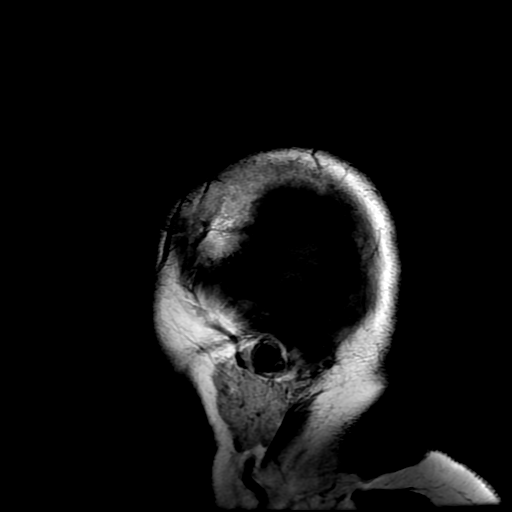

[Series 4: DWI · axial · 3.0mm · 0.94mm/px · z∈[-42,+105]mm · 7 of 100 slices shown (1 of 2)]
[im 1/100]
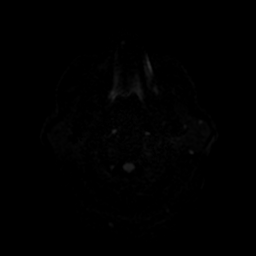
[im 17/100]
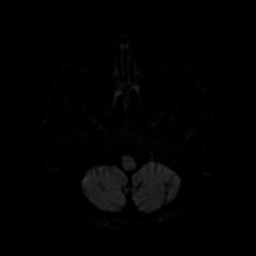
[im 34/100]
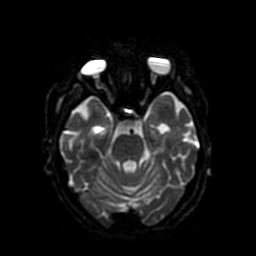
[im 50/100]
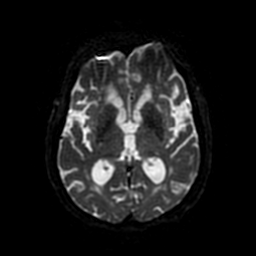
[im 67/100]
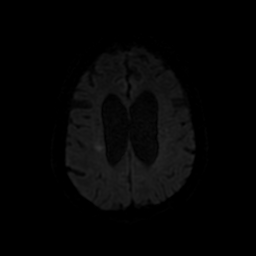
[im 83/100]
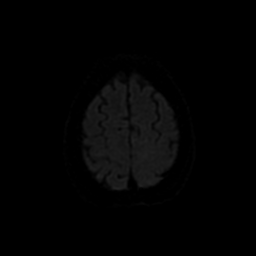
[im 100/100]
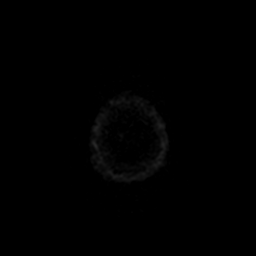

[Series 5: FLAIR · axial · 5.0mm · 0.47mm/px · z∈[-56,+94]mm · 2 of 26 slices shown (2 of 2)]
[im 1/26]
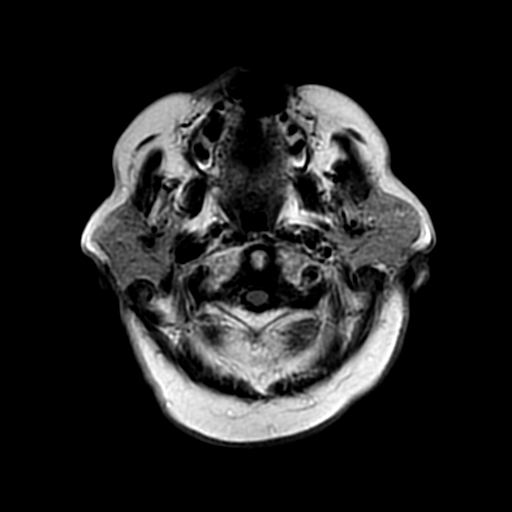
[im 26/26]
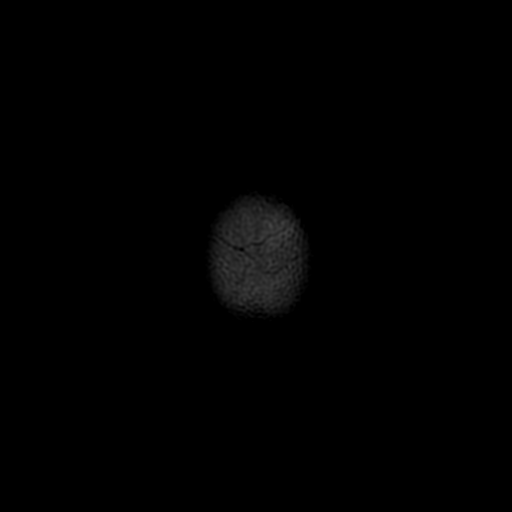

[Series 6: T2 · axial · 5.0mm · 0.47mm/px · z∈[-56,+94]mm · 2 of 26 slices shown (1 of 2)]
[im 1/26]
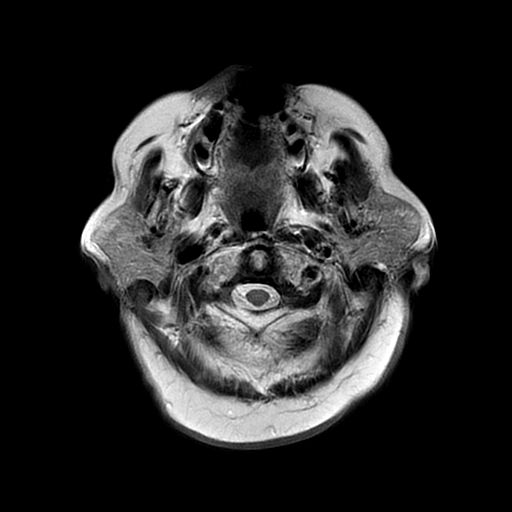
[im 26/26]
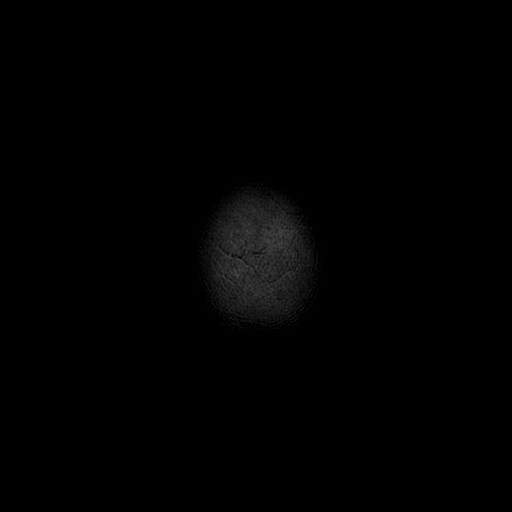

[Series 7: ax (id) 2 · axial · 1.0mm · 0.43mm/px · z∈[-44,+17]mm · 6 of 184 slices shown]
[im 1/184]
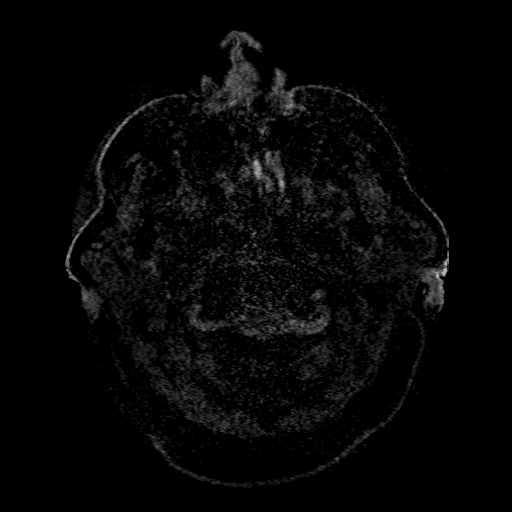
[im 31/184]
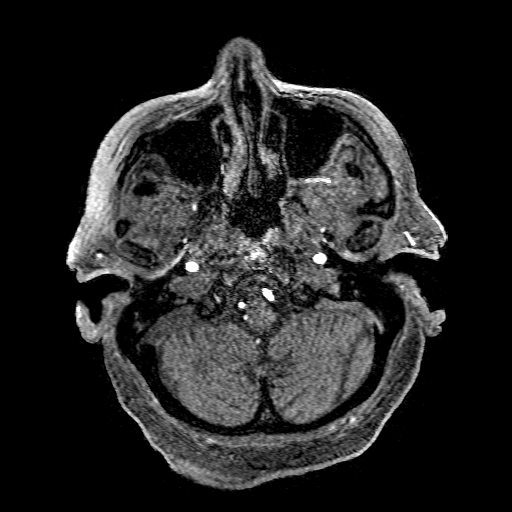
[im 62/184]
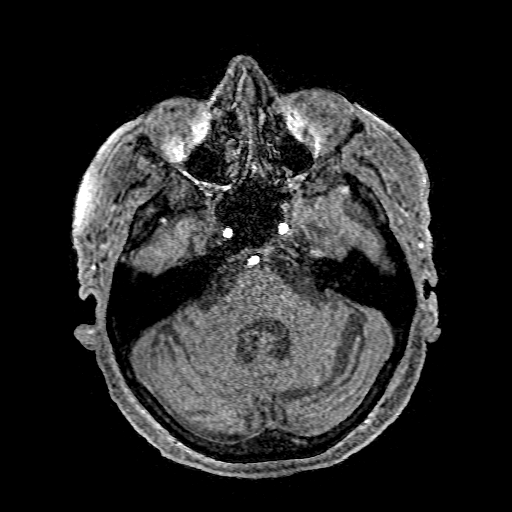
[im 77/184]
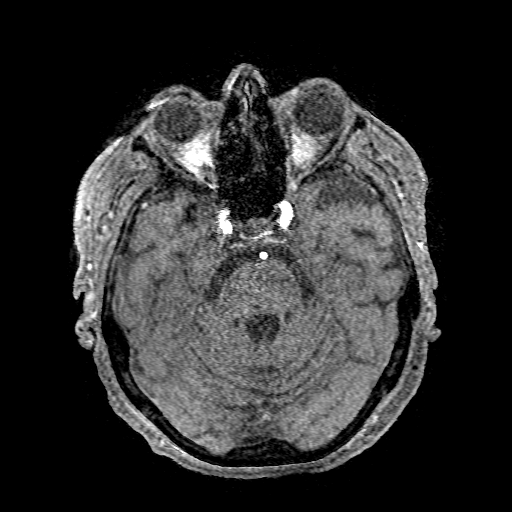
[im 107/184]
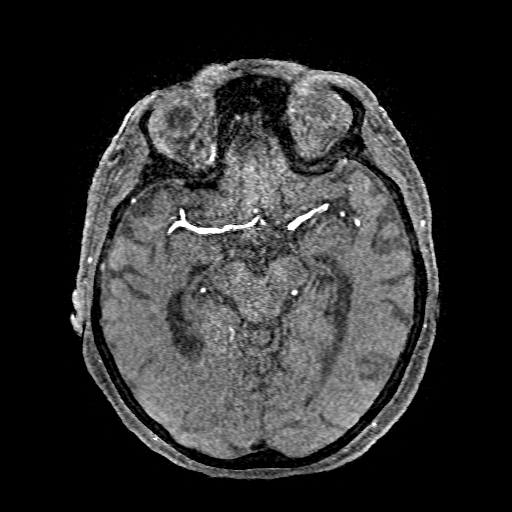
[im 123/184]
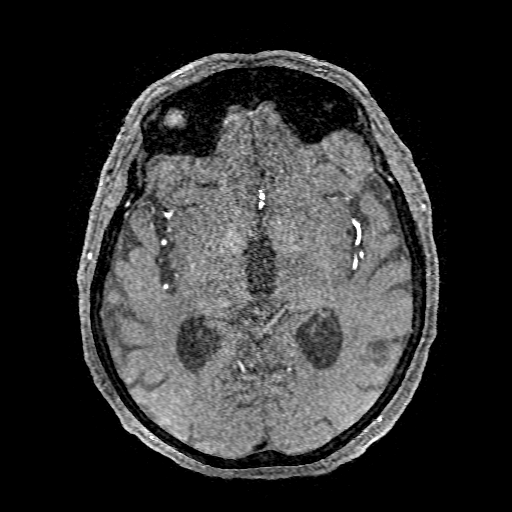

[Series 8: DWI · coronal · 4.0mm · 0.94mm/px · 5 of 66 slices shown (2 of 2)]
[im 1/66]
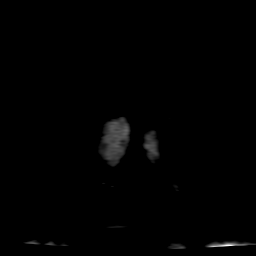
[im 17/66]
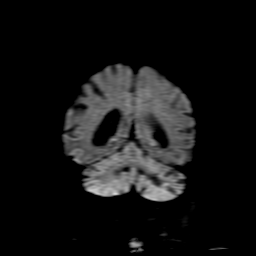
[im 33/66]
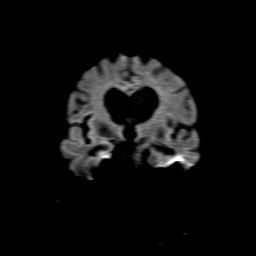
[im 49/66]
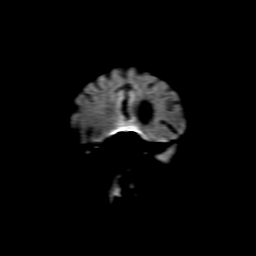
[im 66/66]
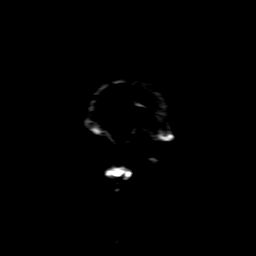

[Series 11: T2 · coronal · 5.0mm · 0.47mm/px · 2 of 27 slices shown (2 of 2)]
[im 1/27]
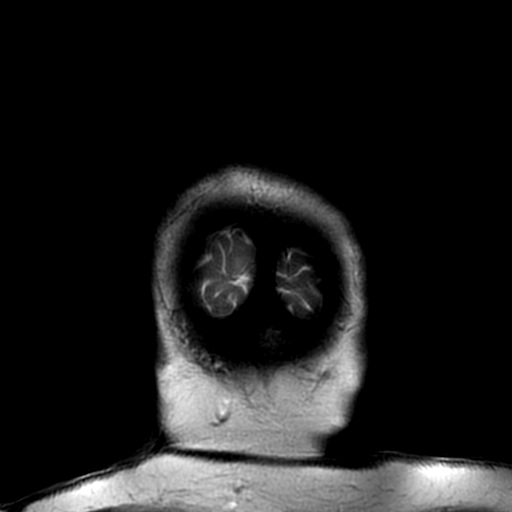
[im 27/27]
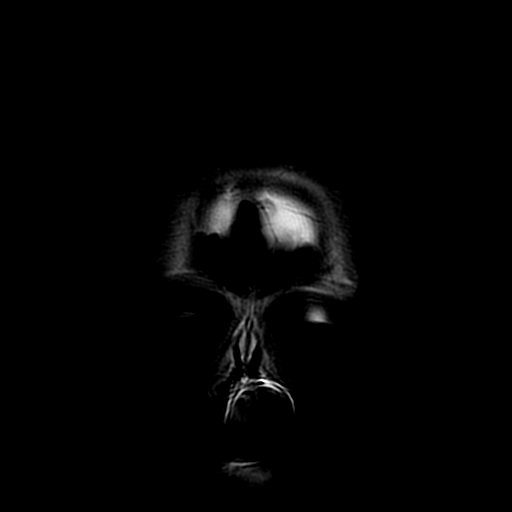

[Series 450: ADC · axial · 3.0mm · 0.94mm/px · z∈[-42,+105]mm · 3 of 50 slices shown (1 of 2)]
[im 1/50]
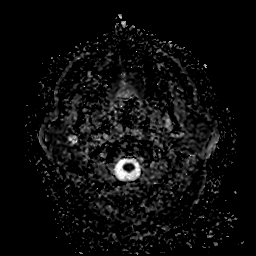
[im 25/50]
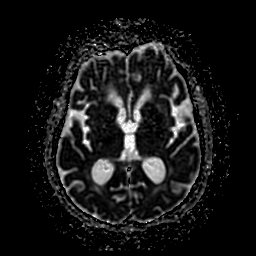
[im 50/50]
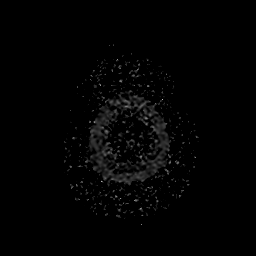

[Series 850: ADC · coronal · 4.0mm · 0.94mm/px · 2 of 33 slices shown (2 of 2)]
[im 1/33]
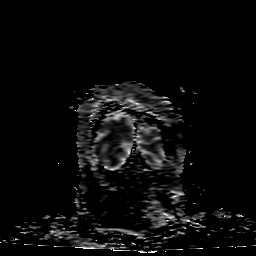
[im 33/33]
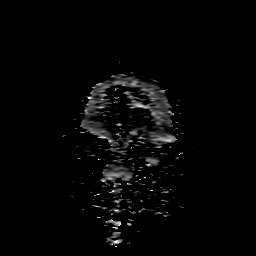

[30 of 48 positions shown; findings below may reference images not displayed]

FINDINGS: MRI HEAD FINDINGS

Brain: Diffusion imaging shows a 1 cm acute infarction in the deep
white matter adjacent to the posterior body of the right lateral
ventricle. No other acute infarction. No evidence of large vessel
territory infarction. Brainstem is normal. There are old small
vessel cerebellar infarctions bilaterally. Cerebral hemispheres show
old infarction in the right external capsule and old cortical and
subcortical infarction in the right posterior frontal region. There
are moderate chronic small-vessel ischemic changes throughout the
white matter. Ventricles are prominent, consistent with central
atrophy. Question slight increase in ventricular prominence since
2620, raising at least the question of normal pressure
hydrocephalus.

Vascular: Major vessels at the base of the brain show flow.

Skull and upper cervical spine: Negative

Sinuses/Orbits: Clear/normal

Other: None significant

MRA HEAD FINDINGS

Both internal carotid arteries are patent into the brain. No siphon
stenosis. The anterior and middle cerebral vessels are patent
without proximal stenosis, aneurysm or vascular malformation. Both
vertebral arteries are patent to the basilar. No basilar stenosis.
Posterior circulation branch vessels are normal proximally.

More distal intracranial vessels show some atherosclerotic
irregularity diffusely.
IMPRESSION: 1 cm acute infarction in the right hemispheric deep white matter
adjacent to the posterior body of the right lateral ventricle.

Old small vessel infarctions and chronic microangiopathic changes.

Negative intracranial MR angiography of the large and medium size
vessels.

Ventricular prominence, probably largely due to central atrophy.
Question if this is slightly progressive since 2620. Is there any
clinical sign of normal pressure hydrocephalus?

## 2019-01-12 DIAGNOSIS — K591 Functional diarrhea: Secondary | ICD-10-CM | POA: Diagnosis not present

## 2019-01-24 DIAGNOSIS — M6281 Muscle weakness (generalized): Secondary | ICD-10-CM | POA: Diagnosis not present

## 2019-01-24 DIAGNOSIS — R278 Other lack of coordination: Secondary | ICD-10-CM | POA: Diagnosis not present

## 2019-01-29 DIAGNOSIS — Z79899 Other long term (current) drug therapy: Secondary | ICD-10-CM | POA: Diagnosis not present

## 2019-01-29 DIAGNOSIS — R278 Other lack of coordination: Secondary | ICD-10-CM | POA: Diagnosis not present

## 2019-01-29 DIAGNOSIS — M6281 Muscle weakness (generalized): Secondary | ICD-10-CM | POA: Diagnosis not present

## 2019-01-31 DIAGNOSIS — R278 Other lack of coordination: Secondary | ICD-10-CM | POA: Diagnosis not present

## 2019-01-31 DIAGNOSIS — M6281 Muscle weakness (generalized): Secondary | ICD-10-CM | POA: Diagnosis not present

## 2019-02-05 DIAGNOSIS — M6281 Muscle weakness (generalized): Secondary | ICD-10-CM | POA: Diagnosis not present

## 2019-02-05 DIAGNOSIS — R278 Other lack of coordination: Secondary | ICD-10-CM | POA: Diagnosis not present

## 2019-02-07 DIAGNOSIS — R278 Other lack of coordination: Secondary | ICD-10-CM | POA: Diagnosis not present

## 2019-02-07 DIAGNOSIS — M6281 Muscle weakness (generalized): Secondary | ICD-10-CM | POA: Diagnosis not present

## 2019-02-12 DIAGNOSIS — R278 Other lack of coordination: Secondary | ICD-10-CM | POA: Diagnosis not present

## 2019-02-12 DIAGNOSIS — M6281 Muscle weakness (generalized): Secondary | ICD-10-CM | POA: Diagnosis not present

## 2019-02-14 DIAGNOSIS — R278 Other lack of coordination: Secondary | ICD-10-CM | POA: Diagnosis not present

## 2019-02-14 DIAGNOSIS — M6281 Muscle weakness (generalized): Secondary | ICD-10-CM | POA: Diagnosis not present

## 2019-02-19 DIAGNOSIS — M6281 Muscle weakness (generalized): Secondary | ICD-10-CM | POA: Diagnosis not present

## 2019-02-19 DIAGNOSIS — R278 Other lack of coordination: Secondary | ICD-10-CM | POA: Diagnosis not present

## 2019-02-21 DIAGNOSIS — R278 Other lack of coordination: Secondary | ICD-10-CM | POA: Diagnosis not present

## 2019-02-21 DIAGNOSIS — M6281 Muscle weakness (generalized): Secondary | ICD-10-CM | POA: Diagnosis not present

## 2019-02-26 DIAGNOSIS — R278 Other lack of coordination: Secondary | ICD-10-CM | POA: Diagnosis not present

## 2019-02-26 DIAGNOSIS — M6281 Muscle weakness (generalized): Secondary | ICD-10-CM | POA: Diagnosis not present

## 2019-02-28 DIAGNOSIS — M6281 Muscle weakness (generalized): Secondary | ICD-10-CM | POA: Diagnosis not present

## 2019-02-28 DIAGNOSIS — R278 Other lack of coordination: Secondary | ICD-10-CM | POA: Diagnosis not present

## 2019-03-05 DIAGNOSIS — M6281 Muscle weakness (generalized): Secondary | ICD-10-CM | POA: Diagnosis not present

## 2019-03-05 DIAGNOSIS — R278 Other lack of coordination: Secondary | ICD-10-CM | POA: Diagnosis not present

## 2019-03-11 DIAGNOSIS — M6281 Muscle weakness (generalized): Secondary | ICD-10-CM | POA: Diagnosis not present

## 2019-03-11 DIAGNOSIS — R278 Other lack of coordination: Secondary | ICD-10-CM | POA: Diagnosis not present

## 2019-03-15 DIAGNOSIS — Z79899 Other long term (current) drug therapy: Secondary | ICD-10-CM | POA: Diagnosis not present

## 2019-04-12 DIAGNOSIS — E119 Type 2 diabetes mellitus without complications: Secondary | ICD-10-CM | POA: Diagnosis not present

## 2019-04-17 DIAGNOSIS — R278 Other lack of coordination: Secondary | ICD-10-CM | POA: Diagnosis not present

## 2019-04-17 DIAGNOSIS — M6281 Muscle weakness (generalized): Secondary | ICD-10-CM | POA: Diagnosis not present

## 2019-04-17 DIAGNOSIS — R293 Abnormal posture: Secondary | ICD-10-CM | POA: Diagnosis not present

## 2019-04-23 DIAGNOSIS — M6281 Muscle weakness (generalized): Secondary | ICD-10-CM | POA: Diagnosis not present

## 2019-04-23 DIAGNOSIS — R293 Abnormal posture: Secondary | ICD-10-CM | POA: Diagnosis not present

## 2019-04-23 DIAGNOSIS — R278 Other lack of coordination: Secondary | ICD-10-CM | POA: Diagnosis not present

## 2019-04-29 DIAGNOSIS — R293 Abnormal posture: Secondary | ICD-10-CM | POA: Diagnosis not present

## 2019-04-29 DIAGNOSIS — M6281 Muscle weakness (generalized): Secondary | ICD-10-CM | POA: Diagnosis not present

## 2019-04-29 DIAGNOSIS — R278 Other lack of coordination: Secondary | ICD-10-CM | POA: Diagnosis not present

## 2019-04-30 DIAGNOSIS — R278 Other lack of coordination: Secondary | ICD-10-CM | POA: Diagnosis not present

## 2019-04-30 DIAGNOSIS — M6281 Muscle weakness (generalized): Secondary | ICD-10-CM | POA: Diagnosis not present

## 2019-04-30 DIAGNOSIS — R293 Abnormal posture: Secondary | ICD-10-CM | POA: Diagnosis not present

## 2019-05-06 DIAGNOSIS — M6281 Muscle weakness (generalized): Secondary | ICD-10-CM | POA: Diagnosis not present

## 2019-05-06 DIAGNOSIS — R293 Abnormal posture: Secondary | ICD-10-CM | POA: Diagnosis not present

## 2019-05-06 DIAGNOSIS — R278 Other lack of coordination: Secondary | ICD-10-CM | POA: Diagnosis not present

## 2019-05-07 DIAGNOSIS — R278 Other lack of coordination: Secondary | ICD-10-CM | POA: Diagnosis not present

## 2019-05-07 DIAGNOSIS — M6281 Muscle weakness (generalized): Secondary | ICD-10-CM | POA: Diagnosis not present

## 2019-05-07 DIAGNOSIS — R293 Abnormal posture: Secondary | ICD-10-CM | POA: Diagnosis not present

## 2019-05-13 DIAGNOSIS — M6281 Muscle weakness (generalized): Secondary | ICD-10-CM | POA: Diagnosis not present

## 2019-05-13 DIAGNOSIS — R293 Abnormal posture: Secondary | ICD-10-CM | POA: Diagnosis not present

## 2019-05-13 DIAGNOSIS — R278 Other lack of coordination: Secondary | ICD-10-CM | POA: Diagnosis not present

## 2019-05-14 DIAGNOSIS — R293 Abnormal posture: Secondary | ICD-10-CM | POA: Diagnosis not present

## 2019-05-14 DIAGNOSIS — M6281 Muscle weakness (generalized): Secondary | ICD-10-CM | POA: Diagnosis not present

## 2019-05-14 DIAGNOSIS — R278 Other lack of coordination: Secondary | ICD-10-CM | POA: Diagnosis not present

## 2019-05-20 DIAGNOSIS — M6281 Muscle weakness (generalized): Secondary | ICD-10-CM | POA: Diagnosis not present

## 2019-05-20 DIAGNOSIS — R278 Other lack of coordination: Secondary | ICD-10-CM | POA: Diagnosis not present

## 2019-05-20 DIAGNOSIS — R293 Abnormal posture: Secondary | ICD-10-CM | POA: Diagnosis not present

## 2019-05-21 DIAGNOSIS — R278 Other lack of coordination: Secondary | ICD-10-CM | POA: Diagnosis not present

## 2019-05-21 DIAGNOSIS — R293 Abnormal posture: Secondary | ICD-10-CM | POA: Diagnosis not present

## 2019-05-21 DIAGNOSIS — M6281 Muscle weakness (generalized): Secondary | ICD-10-CM | POA: Diagnosis not present

## 2019-05-27 DIAGNOSIS — R293 Abnormal posture: Secondary | ICD-10-CM | POA: Diagnosis not present

## 2019-05-27 DIAGNOSIS — M6281 Muscle weakness (generalized): Secondary | ICD-10-CM | POA: Diagnosis not present

## 2019-05-27 DIAGNOSIS — R278 Other lack of coordination: Secondary | ICD-10-CM | POA: Diagnosis not present

## 2019-05-28 DIAGNOSIS — R278 Other lack of coordination: Secondary | ICD-10-CM | POA: Diagnosis not present

## 2019-05-28 DIAGNOSIS — R293 Abnormal posture: Secondary | ICD-10-CM | POA: Diagnosis not present

## 2019-05-28 DIAGNOSIS — M6281 Muscle weakness (generalized): Secondary | ICD-10-CM | POA: Diagnosis not present

## 2019-06-05 DIAGNOSIS — M25511 Pain in right shoulder: Secondary | ICD-10-CM | POA: Diagnosis not present

## 2019-06-19 DIAGNOSIS — M25511 Pain in right shoulder: Secondary | ICD-10-CM | POA: Diagnosis not present

## 2019-06-19 DIAGNOSIS — M1990 Unspecified osteoarthritis, unspecified site: Secondary | ICD-10-CM | POA: Diagnosis not present

## 2019-06-19 DIAGNOSIS — G894 Chronic pain syndrome: Secondary | ICD-10-CM | POA: Diagnosis not present

## 2019-06-19 DIAGNOSIS — K219 Gastro-esophageal reflux disease without esophagitis: Secondary | ICD-10-CM | POA: Diagnosis not present

## 2019-06-19 DIAGNOSIS — I10 Essential (primary) hypertension: Secondary | ICD-10-CM | POA: Diagnosis not present

## 2019-07-03 DIAGNOSIS — G894 Chronic pain syndrome: Secondary | ICD-10-CM | POA: Diagnosis not present

## 2019-07-09 DIAGNOSIS — G301 Alzheimer's disease with late onset: Secondary | ICD-10-CM | POA: Diagnosis not present

## 2019-07-16 DIAGNOSIS — Q845 Enlarged and hypertrophic nails: Secondary | ICD-10-CM | POA: Diagnosis not present

## 2019-07-16 DIAGNOSIS — L603 Nail dystrophy: Secondary | ICD-10-CM | POA: Diagnosis not present

## 2019-07-16 DIAGNOSIS — E119 Type 2 diabetes mellitus without complications: Secondary | ICD-10-CM | POA: Diagnosis not present

## 2019-07-16 DIAGNOSIS — I739 Peripheral vascular disease, unspecified: Secondary | ICD-10-CM | POA: Diagnosis not present

## 2019-08-06 DIAGNOSIS — G301 Alzheimer's disease with late onset: Secondary | ICD-10-CM | POA: Diagnosis not present

## 2019-08-20 DIAGNOSIS — E119 Type 2 diabetes mellitus without complications: Secondary | ICD-10-CM | POA: Diagnosis not present

## 2019-08-20 DIAGNOSIS — I10 Essential (primary) hypertension: Secondary | ICD-10-CM | POA: Diagnosis not present

## 2019-08-20 DIAGNOSIS — K219 Gastro-esophageal reflux disease without esophagitis: Secondary | ICD-10-CM | POA: Diagnosis not present

## 2019-08-21 DIAGNOSIS — K219 Gastro-esophageal reflux disease without esophagitis: Secondary | ICD-10-CM | POA: Diagnosis not present

## 2019-08-21 DIAGNOSIS — E118 Type 2 diabetes mellitus with unspecified complications: Secondary | ICD-10-CM | POA: Diagnosis not present

## 2019-08-21 DIAGNOSIS — J309 Allergic rhinitis, unspecified: Secondary | ICD-10-CM | POA: Diagnosis not present

## 2019-08-21 DIAGNOSIS — M25511 Pain in right shoulder: Secondary | ICD-10-CM | POA: Diagnosis not present

## 2019-08-28 DIAGNOSIS — G894 Chronic pain syndrome: Secondary | ICD-10-CM | POA: Diagnosis not present

## 2019-08-30 ENCOUNTER — Other Ambulatory Visit: Payer: Self-pay

## 2019-08-30 ENCOUNTER — Non-Acute Institutional Stay: Payer: Medicare Other | Admitting: Internal Medicine

## 2019-08-30 ENCOUNTER — Encounter: Payer: Self-pay | Admitting: Internal Medicine

## 2019-08-30 DIAGNOSIS — Z515 Encounter for palliative care: Secondary | ICD-10-CM

## 2019-08-30 NOTE — Progress Notes (Signed)
Sept 25th, 2020 [facility] Telephone: [phone] Fax: [phone]   PATIENT NAME: Katie Mcfarland DOB: January 14, 1920 MRN: [id number] memory care   PRIMARY CARE PROVIDER:   Deland Pretty, MD   REFERRING PROVIDER:  Farrel Conners Thi PA [place]: (dtr) Cherylann Ratel [phone]. (grandchild) [phone] Talbert Nan   RECOMMENDATIONS and PLAN:  1.Advanced Care Planning:  A.Directives: DNR on chart  B.Goals of Care: Comfort, safety, happiness.    2. Alzheimer dementia: Patient's FAST score is 7c. She continues able to converse in short sentences. Alert and oriented to self only; previously she was oriented to place. Combative and resistant to care in the evenings. She is dependent for transfers (2 person), hygiene, and dressing. She is incontinent of bowel and bladder. Though thought to be a high aspiration risk due to dysphagia, her family wished an upgrade in her diet to regular diet. Regular BM on current regimen. Cough with liquids. Oral intake is 50-100%. Last weight 08/09/19 was158 lbs, which is decreased 12 lbs over the last 6 months. Caregiver 10 hr/day 7d/wk. Has sitter 10 hours/day ([facility]).    3. F/U NP visit every 2-3 months (11/08/2019 @ 3:30pm). I left a phone message with patient's daughter Katie Mcfarland with my contact information, for her to call me for visit updates or with any questions or concerns.  I spent 30 minutes providing this consultation from 2:30-3pm. More than 50% of the time in this consultation was spent coordinating communication.    CODE STATUS: DNR   PPS: 30% HOSPICE ELIGIBILITY/DIAGNOSIS: TBD   HISTORY OF PRESENT ILLNESS:  Katie Mcfarland is a 85 y.o. year old female with medical h/o Alzheimer's (FAST 7c), CVA, HTN, diabetes, HLD, COPD, CHF, depression, dysphagia, and aspiration pneumonia. She was a hospice patient for the prior 7 months and was recently discharged due to medical  stability.  This is a f/u Palliative Care visit from 12/10/2018, for ongoing assessment of signs of decline.   PAST MEDICAL HISTORY:  Past Medical History:  Diagnosis Date  . Congestive heart failure (CHF) ([place])   . COPD (chronic obstructive pulmonary disease) ([place])   . Dementia ([place])   . High cholesterol   . Hypertension   . Stroke ([place])   . Vision abnormalities     SOCIAL HX:  Social History   Tobacco Use  . Smoking status: Never Smoker  . Smokeless tobacco: Never Used  Substance Use Topics  . Alcohol use: No    ALLERGIES: No Known Allergies   PERTINENT MEDICATIONS:  Outpatient Encounter Medications as of 08/30/2019  Medication Sig  . amLODipine (NORVASC) 5 MG tablet Take 5 mg by mouth daily.  Marland Kitchen aspirin EC 81 MG tablet Take 81 mg by mouth daily.  Marland Kitchen atorvastatin (LIPITOR) 40 MG tablet Take 1 tablet (40 mg total) by mouth daily at 6 PM. (Patient taking differently: Take 20 mg by mouth daily at 6 PM. )  . cloNIDine (CATAPRES - DOSED IN MG/24 HR) 0.2 mg/24hr patch Place 0.2 mg onto the skin once a week.  . famotidine (PEPCID) 40 MG tablet Take 40 mg by mouth at bedtime.  . furosemide (LASIX) 20 MG tablet Take 20 mg by mouth daily.   Marland Kitchen HYDROcodone-acetaminophen (NORCO/VICODIN) 5-325 MG tablet Take 0.5 tablets by mouth 3 (three) times daily.  . Ipratropium-Albuterol (COMBIVENT RESPIMAT) 20-100 MCG/ACT AERS respimat Inhale 1 puff into the lungs 4 (four) times daily.  Marland Kitchen LANTUS SOLOSTAR 100 UNIT/ML Solostar  Pen Inject 30 Units into the skin every morning.   . latanoprost (XALATAN) 0.005 % ophthalmic solution Place 1 drop into both eyes at bedtime.   . Melatonin 5 MG TABS Take 5 mg by mouth at bedtime.   Benson Setting Oil Heavy OIL Place 2 drops into both ears every 7 (seven) days. Thursdays  . mineral oil light external liquid Apply 1 application topically once. One gtt each ear once a week  . Multiple Vitamin (DAILY VITE) TABS Take 1 tablet by mouth daily.  Marland Kitchen omeprazole (PRILOSEC) 40 MG  capsule Take 40 mg by mouth daily.  . polyethylene glycol powder (GLYCOLAX/MIRALAX) powder Take 17 g by mouth once.  . polyvinyl alcohol (ARTIFICIAL TEARS) 1.4 % ophthalmic solution Place 2 drops into both eyes 4 (four) times daily.  . sertraline (ZOLOFT) 50 MG tablet Take 75 mg by mouth daily. 1.5 of a 50 mg tab qd  . traZODone (DESYREL) 50 MG tablet Take 25 mg by mouth at bedtime. 1/2 of a 50 mg tab  . acetaminophen (TYLENOL) 500 MG tablet Take 500 mg by mouth every 8 (eight) hours as needed.  Marland Kitchen albuterol (PROAIR HFA) 108 (90 Base) MCG/ACT inhaler Inhale 2 puffs into the lungs every 6 (six) hours as needed for wheezing or shortness of breath.  . insulin lispro (HUMALOG) 100 UNIT/ML injection Inject 5 Units into the skin once. As needed for BS> 450. Recheck in 1 hour. If not lowered notify MD  . ondansetron (ZOFRAN) 4 MG tablet Take 4 mg by mouth every 6 (six) hours as needed for nausea or vomiting.  . senna-docusate (SENOKOT-S) 8.6-50 MG tablet Take 1 tablet by mouth every other day.   No facility-administered encounter medications on file as of 08/30/2019.     PHYSICAL EXAM:   General: Well nourished elderly female lying in bed with HOB elevated. She is pleasant, smiling, and polite  Cardiovascular: regular rate and rhythm. Systolic murmur and split S2 Pulmonary: clear ant fields Abdomen: soft, nontender, + bowel sounds GU: no suprapubic tenderness Extremities: no edema, no joint deformities Skin: no rashes Neurological: Weakness but otherwise non-focal  Anselm Lis, NP

## 2019-09-02 DIAGNOSIS — G301 Alzheimer's disease with late onset: Secondary | ICD-10-CM | POA: Diagnosis not present

## 2019-09-02 DIAGNOSIS — G4701 Insomnia due to medical condition: Secondary | ICD-10-CM | POA: Diagnosis not present

## 2019-09-03 DIAGNOSIS — G4701 Insomnia due to medical condition: Secondary | ICD-10-CM | POA: Diagnosis not present

## 2019-09-03 DIAGNOSIS — G301 Alzheimer's disease with late onset: Secondary | ICD-10-CM | POA: Diagnosis not present

## 2019-09-18 DIAGNOSIS — K219 Gastro-esophageal reflux disease without esophagitis: Secondary | ICD-10-CM | POA: Diagnosis not present

## 2019-09-18 DIAGNOSIS — E782 Mixed hyperlipidemia: Secondary | ICD-10-CM | POA: Diagnosis not present

## 2019-09-18 DIAGNOSIS — E559 Vitamin D deficiency, unspecified: Secondary | ICD-10-CM | POA: Diagnosis not present

## 2019-09-25 DIAGNOSIS — G894 Chronic pain syndrome: Secondary | ICD-10-CM | POA: Diagnosis not present

## 2019-09-26 DIAGNOSIS — J449 Chronic obstructive pulmonary disease, unspecified: Secondary | ICD-10-CM | POA: Diagnosis not present

## 2019-09-27 DIAGNOSIS — Z79899 Other long term (current) drug therapy: Secondary | ICD-10-CM | POA: Diagnosis not present

## 2019-10-01 DIAGNOSIS — G4701 Insomnia due to medical condition: Secondary | ICD-10-CM | POA: Diagnosis not present

## 2019-10-01 DIAGNOSIS — G301 Alzheimer's disease with late onset: Secondary | ICD-10-CM | POA: Diagnosis not present

## 2019-10-09 DIAGNOSIS — E119 Type 2 diabetes mellitus without complications: Secondary | ICD-10-CM | POA: Diagnosis not present

## 2019-10-09 DIAGNOSIS — I739 Peripheral vascular disease, unspecified: Secondary | ICD-10-CM | POA: Diagnosis not present

## 2019-10-09 DIAGNOSIS — Q845 Enlarged and hypertrophic nails: Secondary | ICD-10-CM | POA: Diagnosis not present

## 2019-10-11 DIAGNOSIS — J449 Chronic obstructive pulmonary disease, unspecified: Secondary | ICD-10-CM | POA: Diagnosis not present

## 2019-10-16 DIAGNOSIS — K219 Gastro-esophageal reflux disease without esophagitis: Secondary | ICD-10-CM | POA: Diagnosis not present

## 2019-10-16 DIAGNOSIS — R600 Localized edema: Secondary | ICD-10-CM | POA: Diagnosis not present

## 2019-10-16 DIAGNOSIS — E119 Type 2 diabetes mellitus without complications: Secondary | ICD-10-CM | POA: Diagnosis not present

## 2019-10-23 DIAGNOSIS — G894 Chronic pain syndrome: Secondary | ICD-10-CM | POA: Diagnosis not present

## 2019-10-29 DIAGNOSIS — G4701 Insomnia due to medical condition: Secondary | ICD-10-CM | POA: Diagnosis not present

## 2019-10-29 DIAGNOSIS — G301 Alzheimer's disease with late onset: Secondary | ICD-10-CM | POA: Diagnosis not present

## 2019-11-13 DIAGNOSIS — E782 Mixed hyperlipidemia: Secondary | ICD-10-CM | POA: Diagnosis not present

## 2019-11-13 DIAGNOSIS — M1991 Primary osteoarthritis, unspecified site: Secondary | ICD-10-CM | POA: Diagnosis not present

## 2019-11-13 DIAGNOSIS — E559 Vitamin D deficiency, unspecified: Secondary | ICD-10-CM | POA: Diagnosis not present

## 2019-11-13 DIAGNOSIS — U071 COVID-19: Secondary | ICD-10-CM | POA: Diagnosis not present

## 2019-11-20 DIAGNOSIS — G894 Chronic pain syndrome: Secondary | ICD-10-CM | POA: Diagnosis not present

## 2019-11-20 DIAGNOSIS — U071 COVID-19: Secondary | ICD-10-CM | POA: Diagnosis not present

## 2019-11-22 DIAGNOSIS — R790 Abnormal level of blood mineral: Secondary | ICD-10-CM | POA: Diagnosis not present

## 2019-11-22 DIAGNOSIS — I10 Essential (primary) hypertension: Secondary | ICD-10-CM | POA: Diagnosis not present

## 2019-11-26 DIAGNOSIS — G301 Alzheimer's disease with late onset: Secondary | ICD-10-CM | POA: Diagnosis not present

## 2019-11-26 DIAGNOSIS — G4701 Insomnia due to medical condition: Secondary | ICD-10-CM | POA: Diagnosis not present

## 2019-11-27 DIAGNOSIS — U071 COVID-19: Secondary | ICD-10-CM | POA: Diagnosis not present

## 2019-12-04 DIAGNOSIS — U071 COVID-19: Secondary | ICD-10-CM | POA: Diagnosis not present

## 2019-12-09 DIAGNOSIS — E119 Type 2 diabetes mellitus without complications: Secondary | ICD-10-CM | POA: Diagnosis not present

## 2019-12-09 DIAGNOSIS — U071 COVID-19: Secondary | ICD-10-CM | POA: Diagnosis not present

## 2019-12-09 DIAGNOSIS — K219 Gastro-esophageal reflux disease without esophagitis: Secondary | ICD-10-CM | POA: Diagnosis not present

## 2019-12-09 DIAGNOSIS — E782 Mixed hyperlipidemia: Secondary | ICD-10-CM | POA: Diagnosis not present

## 2019-12-17 DIAGNOSIS — E119 Type 2 diabetes mellitus without complications: Secondary | ICD-10-CM | POA: Diagnosis not present

## 2019-12-17 DIAGNOSIS — B351 Tinea unguium: Secondary | ICD-10-CM | POA: Diagnosis not present

## 2019-12-17 DIAGNOSIS — I739 Peripheral vascular disease, unspecified: Secondary | ICD-10-CM | POA: Diagnosis not present

## 2019-12-17 DIAGNOSIS — Q845 Enlarged and hypertrophic nails: Secondary | ICD-10-CM | POA: Diagnosis not present

## 2019-12-23 DIAGNOSIS — G894 Chronic pain syndrome: Secondary | ICD-10-CM | POA: Diagnosis not present

## 2019-12-24 DIAGNOSIS — G4701 Insomnia due to medical condition: Secondary | ICD-10-CM | POA: Diagnosis not present

## 2019-12-24 DIAGNOSIS — G301 Alzheimer's disease with late onset: Secondary | ICD-10-CM | POA: Diagnosis not present

## 2020-01-06 DIAGNOSIS — M6281 Muscle weakness (generalized): Secondary | ICD-10-CM | POA: Diagnosis not present

## 2020-01-06 DIAGNOSIS — E119 Type 2 diabetes mellitus without complications: Secondary | ICD-10-CM | POA: Diagnosis not present

## 2020-01-06 DIAGNOSIS — H6122 Impacted cerumen, left ear: Secondary | ICD-10-CM | POA: Diagnosis not present

## 2020-01-06 DIAGNOSIS — H6121 Impacted cerumen, right ear: Secondary | ICD-10-CM | POA: Diagnosis not present

## 2020-01-06 DIAGNOSIS — K219 Gastro-esophageal reflux disease without esophagitis: Secondary | ICD-10-CM | POA: Diagnosis not present

## 2020-01-20 DIAGNOSIS — G894 Chronic pain syndrome: Secondary | ICD-10-CM | POA: Diagnosis not present

## 2020-01-20 DIAGNOSIS — Z79899 Other long term (current) drug therapy: Secondary | ICD-10-CM | POA: Diagnosis not present

## 2020-01-21 DIAGNOSIS — G301 Alzheimer's disease with late onset: Secondary | ICD-10-CM | POA: Diagnosis not present

## 2020-01-21 DIAGNOSIS — G4701 Insomnia due to medical condition: Secondary | ICD-10-CM | POA: Diagnosis not present

## 2020-02-03 DIAGNOSIS — E782 Mixed hyperlipidemia: Secondary | ICD-10-CM | POA: Diagnosis not present

## 2020-02-03 DIAGNOSIS — E559 Vitamin D deficiency, unspecified: Secondary | ICD-10-CM | POA: Diagnosis not present

## 2020-02-03 DIAGNOSIS — R600 Localized edema: Secondary | ICD-10-CM | POA: Diagnosis not present

## 2020-02-07 DIAGNOSIS — E559 Vitamin D deficiency, unspecified: Secondary | ICD-10-CM | POA: Diagnosis not present

## 2020-02-07 DIAGNOSIS — E119 Type 2 diabetes mellitus without complications: Secondary | ICD-10-CM | POA: Diagnosis not present

## 2020-02-07 DIAGNOSIS — D519 Vitamin B12 deficiency anemia, unspecified: Secondary | ICD-10-CM | POA: Diagnosis not present

## 2020-02-07 DIAGNOSIS — Z79899 Other long term (current) drug therapy: Secondary | ICD-10-CM | POA: Diagnosis not present

## 2020-02-11 DIAGNOSIS — G301 Alzheimer's disease with late onset: Secondary | ICD-10-CM | POA: Diagnosis not present

## 2020-02-11 DIAGNOSIS — G4701 Insomnia due to medical condition: Secondary | ICD-10-CM | POA: Diagnosis not present

## 2020-02-17 DIAGNOSIS — E782 Mixed hyperlipidemia: Secondary | ICD-10-CM | POA: Diagnosis not present

## 2020-02-17 DIAGNOSIS — G894 Chronic pain syndrome: Secondary | ICD-10-CM | POA: Diagnosis not present

## 2020-02-18 DIAGNOSIS — G301 Alzheimer's disease with late onset: Secondary | ICD-10-CM | POA: Diagnosis not present

## 2020-02-18 DIAGNOSIS — G4701 Insomnia due to medical condition: Secondary | ICD-10-CM | POA: Diagnosis not present

## 2020-02-25 DIAGNOSIS — R278 Other lack of coordination: Secondary | ICD-10-CM | POA: Diagnosis not present

## 2020-02-25 DIAGNOSIS — M6281 Muscle weakness (generalized): Secondary | ICD-10-CM | POA: Diagnosis not present

## 2020-02-25 DIAGNOSIS — R293 Abnormal posture: Secondary | ICD-10-CM | POA: Diagnosis not present

## 2020-03-02 DIAGNOSIS — M6281 Muscle weakness (generalized): Secondary | ICD-10-CM | POA: Diagnosis not present

## 2020-03-02 DIAGNOSIS — R278 Other lack of coordination: Secondary | ICD-10-CM | POA: Diagnosis not present

## 2020-03-02 DIAGNOSIS — R293 Abnormal posture: Secondary | ICD-10-CM | POA: Diagnosis not present

## 2020-03-06 DIAGNOSIS — R278 Other lack of coordination: Secondary | ICD-10-CM | POA: Diagnosis not present

## 2020-03-06 DIAGNOSIS — M6281 Muscle weakness (generalized): Secondary | ICD-10-CM | POA: Diagnosis not present

## 2020-03-06 DIAGNOSIS — R293 Abnormal posture: Secondary | ICD-10-CM | POA: Diagnosis not present

## 2020-03-09 DIAGNOSIS — R293 Abnormal posture: Secondary | ICD-10-CM | POA: Diagnosis not present

## 2020-03-09 DIAGNOSIS — R278 Other lack of coordination: Secondary | ICD-10-CM | POA: Diagnosis not present

## 2020-03-09 DIAGNOSIS — M6281 Muscle weakness (generalized): Secondary | ICD-10-CM | POA: Diagnosis not present

## 2020-03-10 DIAGNOSIS — M6281 Muscle weakness (generalized): Secondary | ICD-10-CM | POA: Diagnosis not present

## 2020-03-10 DIAGNOSIS — R278 Other lack of coordination: Secondary | ICD-10-CM | POA: Diagnosis not present

## 2020-03-10 DIAGNOSIS — R293 Abnormal posture: Secondary | ICD-10-CM | POA: Diagnosis not present

## 2020-03-12 DIAGNOSIS — R293 Abnormal posture: Secondary | ICD-10-CM | POA: Diagnosis not present

## 2020-03-12 DIAGNOSIS — R278 Other lack of coordination: Secondary | ICD-10-CM | POA: Diagnosis not present

## 2020-03-12 DIAGNOSIS — M6281 Muscle weakness (generalized): Secondary | ICD-10-CM | POA: Diagnosis not present

## 2020-03-16 DIAGNOSIS — G894 Chronic pain syndrome: Secondary | ICD-10-CM | POA: Diagnosis not present

## 2020-03-23 DIAGNOSIS — R278 Other lack of coordination: Secondary | ICD-10-CM | POA: Diagnosis not present

## 2020-03-23 DIAGNOSIS — E119 Type 2 diabetes mellitus without complications: Secondary | ICD-10-CM | POA: Diagnosis not present

## 2020-03-23 DIAGNOSIS — D519 Vitamin B12 deficiency anemia, unspecified: Secondary | ICD-10-CM | POA: Diagnosis not present

## 2020-03-23 DIAGNOSIS — R293 Abnormal posture: Secondary | ICD-10-CM | POA: Diagnosis not present

## 2020-03-23 DIAGNOSIS — M6281 Muscle weakness (generalized): Secondary | ICD-10-CM | POA: Diagnosis not present

## 2020-03-23 DIAGNOSIS — K219 Gastro-esophageal reflux disease without esophagitis: Secondary | ICD-10-CM | POA: Diagnosis not present

## 2020-03-24 DIAGNOSIS — R293 Abnormal posture: Secondary | ICD-10-CM | POA: Diagnosis not present

## 2020-03-24 DIAGNOSIS — M6281 Muscle weakness (generalized): Secondary | ICD-10-CM | POA: Diagnosis not present

## 2020-03-24 DIAGNOSIS — R278 Other lack of coordination: Secondary | ICD-10-CM | POA: Diagnosis not present

## 2020-03-30 DIAGNOSIS — I739 Peripheral vascular disease, unspecified: Secondary | ICD-10-CM | POA: Diagnosis not present

## 2020-03-31 DIAGNOSIS — G4701 Insomnia due to medical condition: Secondary | ICD-10-CM | POA: Diagnosis not present

## 2020-03-31 DIAGNOSIS — G301 Alzheimer's disease with late onset: Secondary | ICD-10-CM | POA: Diagnosis not present

## 2020-04-10 DIAGNOSIS — B351 Tinea unguium: Secondary | ICD-10-CM | POA: Diagnosis not present

## 2020-04-10 DIAGNOSIS — I739 Peripheral vascular disease, unspecified: Secondary | ICD-10-CM | POA: Diagnosis not present

## 2020-04-10 DIAGNOSIS — Q845 Enlarged and hypertrophic nails: Secondary | ICD-10-CM | POA: Diagnosis not present

## 2020-04-20 DIAGNOSIS — G894 Chronic pain syndrome: Secondary | ICD-10-CM | POA: Diagnosis not present

## 2020-04-28 DIAGNOSIS — G4701 Insomnia due to medical condition: Secondary | ICD-10-CM | POA: Diagnosis not present

## 2020-04-28 DIAGNOSIS — G301 Alzheimer's disease with late onset: Secondary | ICD-10-CM | POA: Diagnosis not present

## 2020-05-04 DIAGNOSIS — E559 Vitamin D deficiency, unspecified: Secondary | ICD-10-CM | POA: Diagnosis not present

## 2020-05-04 DIAGNOSIS — H6121 Impacted cerumen, right ear: Secondary | ICD-10-CM | POA: Diagnosis not present

## 2020-05-04 DIAGNOSIS — E782 Mixed hyperlipidemia: Secondary | ICD-10-CM | POA: Diagnosis not present

## 2020-05-04 DIAGNOSIS — K219 Gastro-esophageal reflux disease without esophagitis: Secondary | ICD-10-CM | POA: Diagnosis not present

## 2020-05-04 DIAGNOSIS — H6122 Impacted cerumen, left ear: Secondary | ICD-10-CM | POA: Diagnosis not present

## 2020-05-18 DIAGNOSIS — K5901 Slow transit constipation: Secondary | ICD-10-CM | POA: Diagnosis not present

## 2020-05-18 DIAGNOSIS — G894 Chronic pain syndrome: Secondary | ICD-10-CM | POA: Diagnosis not present

## 2020-05-19 DIAGNOSIS — K219 Gastro-esophageal reflux disease without esophagitis: Secondary | ICD-10-CM | POA: Diagnosis not present

## 2020-05-19 DIAGNOSIS — E782 Mixed hyperlipidemia: Secondary | ICD-10-CM | POA: Diagnosis not present

## 2020-05-19 DIAGNOSIS — E119 Type 2 diabetes mellitus without complications: Secondary | ICD-10-CM | POA: Diagnosis not present

## 2020-05-19 DIAGNOSIS — E559 Vitamin D deficiency, unspecified: Secondary | ICD-10-CM | POA: Diagnosis not present

## 2020-05-21 DIAGNOSIS — Z9229 Personal history of other drug therapy: Secondary | ICD-10-CM | POA: Diagnosis not present

## 2020-05-21 DIAGNOSIS — Z79899 Other long term (current) drug therapy: Secondary | ICD-10-CM | POA: Diagnosis not present

## 2020-05-26 DIAGNOSIS — G4701 Insomnia due to medical condition: Secondary | ICD-10-CM | POA: Diagnosis not present

## 2020-05-26 DIAGNOSIS — G301 Alzheimer's disease with late onset: Secondary | ICD-10-CM | POA: Diagnosis not present

## 2020-06-01 DIAGNOSIS — R600 Localized edema: Secondary | ICD-10-CM | POA: Diagnosis not present

## 2020-06-01 DIAGNOSIS — M6281 Muscle weakness (generalized): Secondary | ICD-10-CM | POA: Diagnosis not present

## 2020-06-01 DIAGNOSIS — D519 Vitamin B12 deficiency anemia, unspecified: Secondary | ICD-10-CM | POA: Diagnosis not present

## 2020-06-15 DIAGNOSIS — G894 Chronic pain syndrome: Secondary | ICD-10-CM | POA: Diagnosis not present

## 2020-06-22 DIAGNOSIS — E782 Mixed hyperlipidemia: Secondary | ICD-10-CM | POA: Diagnosis not present

## 2020-06-22 DIAGNOSIS — D519 Vitamin B12 deficiency anemia, unspecified: Secondary | ICD-10-CM | POA: Diagnosis not present

## 2020-06-22 DIAGNOSIS — K219 Gastro-esophageal reflux disease without esophagitis: Secondary | ICD-10-CM | POA: Diagnosis not present

## 2020-06-23 DIAGNOSIS — G4701 Insomnia due to medical condition: Secondary | ICD-10-CM | POA: Diagnosis not present

## 2020-06-23 DIAGNOSIS — G301 Alzheimer's disease with late onset: Secondary | ICD-10-CM | POA: Diagnosis not present

## 2020-07-13 DIAGNOSIS — G894 Chronic pain syndrome: Secondary | ICD-10-CM | POA: Diagnosis not present

## 2020-07-21 DIAGNOSIS — G4701 Insomnia due to medical condition: Secondary | ICD-10-CM | POA: Diagnosis not present

## 2020-07-21 DIAGNOSIS — G301 Alzheimer's disease with late onset: Secondary | ICD-10-CM | POA: Diagnosis not present

## 2020-07-27 DIAGNOSIS — R600 Localized edema: Secondary | ICD-10-CM | POA: Diagnosis not present

## 2020-07-27 DIAGNOSIS — E119 Type 2 diabetes mellitus without complications: Secondary | ICD-10-CM | POA: Diagnosis not present

## 2020-07-27 DIAGNOSIS — D519 Vitamin B12 deficiency anemia, unspecified: Secondary | ICD-10-CM | POA: Diagnosis not present

## 2020-07-27 DIAGNOSIS — E559 Vitamin D deficiency, unspecified: Secondary | ICD-10-CM | POA: Diagnosis not present

## 2020-07-29 DIAGNOSIS — Z9229 Personal history of other drug therapy: Secondary | ICD-10-CM | POA: Diagnosis not present

## 2020-07-29 DIAGNOSIS — Z79899 Other long term (current) drug therapy: Secondary | ICD-10-CM | POA: Diagnosis not present

## 2020-07-29 DIAGNOSIS — Z79891 Long term (current) use of opiate analgesic: Secondary | ICD-10-CM | POA: Diagnosis not present

## 2020-08-14 DIAGNOSIS — I739 Peripheral vascular disease, unspecified: Secondary | ICD-10-CM | POA: Diagnosis not present

## 2020-08-14 DIAGNOSIS — L84 Corns and callosities: Secondary | ICD-10-CM | POA: Diagnosis not present

## 2020-08-14 DIAGNOSIS — B351 Tinea unguium: Secondary | ICD-10-CM | POA: Diagnosis not present

## 2020-08-17 ENCOUNTER — Non-Acute Institutional Stay: Payer: Medicare Other | Admitting: Nurse Practitioner

## 2020-08-17 ENCOUNTER — Other Ambulatory Visit: Payer: Self-pay

## 2020-08-17 DIAGNOSIS — G894 Chronic pain syndrome: Secondary | ICD-10-CM | POA: Diagnosis not present

## 2020-08-17 DIAGNOSIS — F0390 Unspecified dementia without behavioral disturbance: Secondary | ICD-10-CM

## 2020-08-17 DIAGNOSIS — Z515 Encounter for palliative care: Secondary | ICD-10-CM

## 2020-08-17 NOTE — Progress Notes (Signed)
[facility] Consult Note Telephone: [phone]  Fax: [phone]  PATIENT NAME: Katie Mcfarland [id number] [phone] (home)  DOB: [birth year] MRN: [id number]  PRIMARY CARE PROVIDER:    Deland Pretty, MD,  [address] [place] [address] [phone]  REFERRING PROVIDER:   Deland Pretty, [place],  [place] [address] [phone]  RESPONSIBLE PARTY:   Extended Emergency Contact Information Primary Emergency Contact: Cherylann Ratel Address: [address]          [place],  [address] [place] Phone: [phone] Relation: Daughter  I met face to face with patient and family in home/facility.  ASSESSMENT AND RECOMMENDATIONS:   1. Advance Care Planning/Goals of Care: Goals of Palliative care include to maximize quality of life and symptom management.   Directives: code status is DNR. Copy on file in facility.  Goals of care: Patient's goals of care is comfort, safety, happiness. Palliative care will continue to provide support to patient, family and the medical team.   2. Symptom Management: Alzhemiers dementia (FAST score 7c). Weight stable, staff report fair appetite with meal intake between 50-75%. Patient snacks between meals. Report regular bowel movement on daily Miralax. No evidence of aspiration reported, pt with history of aspiration pneumonia. Recommended close monitoring for evidence of aspiration like choking, sputtering or coughing during meals. Recommended maintaining patient in upright position during and immediately after meals.  3. Follow up Palliative Care Visit: Palliative care will continue to follow for goals of care clarification and symptom management. Return 2-3 months or prn.  4. Family /Caregiver/Community Supports: Patient finishing luncg at the time of the visit. Per personal care aide  Fortunato Curling from [place], patient family out of town on vacation. Family very involved in her care. Patient is a resident on the memory care unit of the Assisted living.  5. Cognitive / Functional decline: Patient is total dependent for all of her ADLs, she is able to feed self. She is incontinent of bowel and bladder. Patient ambulates with wheelchair, relies on person to wheel her around. She is alert and oriented to self and place.  I spent 48 minutes providing this consultation, time includes time spent with patient and her personal care aide, chart review, provider coordination, and documentation. More than 50% of the time in this consultation was spent coordinating communication.   HISTORY OF PRESENT ILLNESS:  Katie Mcfarland is a 86 y.o. year old female with multiple medical problems including Alzheimer's dementia, CVA, HTN, diabetes, HLD, COPD, CHF, depression, dysphagia with history of aspiration pneumonia. Patient was a former hospice patient, she was on Hospice care for 7 months, and was discharged due to medical stability. Palliative Care is following patient in consultation by request of Deland Pretty, MD to help address advance care planning and goals of care. This is a follow up visit, last visit was on 08/30/2019.  CODE STATUS: DNR  PPS: 40%  HOSPICE ELIGIBILITY/DIAGNOSIS: TBD  PAST MEDICAL HISTORY:  Past Medical History:  Diagnosis Date  . Congestive heart failure (CHF) ([place])   . COPD (chronic obstructive pulmonary disease) ([place])   . Dementia ([place])   . High cholesterol   . Hypertension   . Stroke ([place])   . Vision abnormalities     SOCIAL HX:  Social History   Tobacco Use  . Smoking status: Never Smoker  . Smokeless tobacco: Never Used  Substance Use Topics  .  Alcohol use: No   FAMILY HX:  Family History  Problem Relation Age of Onset  . Stroke Mother   . Diabetes Mother   . Tuberculosis Father     ALLERGIES: No Known Allergies   PERTINENT  MEDICATIONS:  Outpatient Encounter Medications as of 08/17/2020  Medication Sig  . acetaminophen (TYLENOL) 500 MG tablet Take 500 mg by mouth every 8 (eight) hours as needed.  Marland Kitchen albuterol (PROAIR HFA) 108 (90 Base) MCG/ACT inhaler Inhale 2 puffs into the lungs every 6 (six) hours as needed for wheezing or shortness of breath.  Marland Kitchen amLODipine (NORVASC) 5 MG tablet Take 5 mg by mouth daily.  Marland Kitchen aspirin EC 81 MG tablet Take 81 mg by mouth daily.  Marland Kitchen atorvastatin (LIPITOR) 40 MG tablet Take 1 tablet (40 mg total) by mouth daily at 6 PM. (Patient taking differently: Take 20 mg by mouth daily at 6 PM. )  . cloNIDine (CATAPRES - DOSED IN MG/24 HR) 0.2 mg/24hr patch Place 0.2 mg onto the skin once a week.  . famotidine (PEPCID) 40 MG tablet Take 40 mg by mouth at bedtime.  . furosemide (LASIX) 20 MG tablet Take 20 mg by mouth daily.   Marland Kitchen HYDROcodone-acetaminophen (NORCO/VICODIN) 5-325 MG tablet Take 0.5 tablets by mouth 3 (three) times daily.  . insulin lispro (HUMALOG) 100 UNIT/ML injection Inject 5 Units into the skin once. As needed for BS> 450. Recheck in 1 hour. If not lowered notify MD  . Ipratropium-Albuterol (COMBIVENT RESPIMAT) 20-100 MCG/ACT AERS respimat Inhale 1 puff into the lungs 4 (four) times daily.  Marland Kitchen LANTUS SOLOSTAR 100 UNIT/ML Solostar Pen Inject 30 Units into the skin every morning.   . latanoprost (XALATAN) 0.005 % ophthalmic solution Place 1 drop into both eyes at bedtime.   . Melatonin 5 MG TABS Take 5 mg by mouth at bedtime.   [place] Face Oil Heavy OIL Place 2 drops into both ears every 7 (seven) days. Thursdays  . mineral oil light external liquid Apply 1 application topically once. One gtt each ear once a week  . Multiple Vitamin (DAILY VITE) TABS Take 1 tablet by mouth daily.  Marland Kitchen omeprazole (PRILOSEC) 40 MG capsule Take 40 mg by mouth daily.  . ondansetron (ZOFRAN) 4 MG tablet Take 4 mg by mouth every 6 (six) hours as needed for nausea or vomiting.  . polyethylene glycol powder  (GLYCOLAX/MIRALAX) powder Take 17 g by mouth once.  . polyvinyl alcohol (ARTIFICIAL TEARS) 1.4 % ophthalmic solution Place 2 drops into both eyes 4 (four) times daily.  Marland Kitchen senna-docusate (SENOKOT-S) 8.6-50 MG tablet Take 1 tablet by mouth every other day.  . sertraline (ZOLOFT) 50 MG tablet Take 75 mg by mouth daily. 1.5 of a 50 mg tab qd  . traZODone (DESYREL) 50 MG tablet Take 25 mg by mouth at bedtime. 1/2 of a 50 mg tab   No facility-administered encounter medications on file as of 08/17/2020.    PHYSICAL EXAM / ROS:   Current and past weights: 156.2kg down from 158lbs at last palliative care visit. General: NAD, frail appearing, cooperative and appropriate Cardiovascular: denied chest pain reported, no edema, S1S2 normal Pulmonary: no cough, no increased SOB, room air Abdomen: appetite fair, denied abd pain, incontinent of bowel GU: denies dysuria, incontinent of urine MSK:  no joint and ROM abnormalities Skin: no rashes or wounds reported or on exposed skin Neurological: Weakness, but otherwise nonfocal  Jari Favre, DNP, AGPCNP-BC

## 2020-08-18 DIAGNOSIS — G4701 Insomnia due to medical condition: Secondary | ICD-10-CM | POA: Diagnosis not present

## 2020-08-18 DIAGNOSIS — G301 Alzheimer's disease with late onset: Secondary | ICD-10-CM | POA: Diagnosis not present

## 2020-08-24 DIAGNOSIS — H6122 Impacted cerumen, left ear: Secondary | ICD-10-CM | POA: Diagnosis not present

## 2020-08-24 DIAGNOSIS — H6121 Impacted cerumen, right ear: Secondary | ICD-10-CM | POA: Diagnosis not present

## 2020-08-24 DIAGNOSIS — K219 Gastro-esophageal reflux disease without esophagitis: Secondary | ICD-10-CM | POA: Diagnosis not present

## 2020-08-24 DIAGNOSIS — E782 Mixed hyperlipidemia: Secondary | ICD-10-CM | POA: Diagnosis not present

## 2020-08-24 DIAGNOSIS — E119 Type 2 diabetes mellitus without complications: Secondary | ICD-10-CM | POA: Diagnosis not present

## 2020-09-14 DIAGNOSIS — G894 Chronic pain syndrome: Secondary | ICD-10-CM | POA: Diagnosis not present

## 2020-09-15 DIAGNOSIS — G4701 Insomnia due to medical condition: Secondary | ICD-10-CM | POA: Diagnosis not present

## 2020-09-15 DIAGNOSIS — G301 Alzheimer's disease with late onset: Secondary | ICD-10-CM | POA: Diagnosis not present

## 2020-09-21 DIAGNOSIS — D519 Vitamin B12 deficiency anemia, unspecified: Secondary | ICD-10-CM | POA: Diagnosis not present

## 2020-09-21 DIAGNOSIS — E119 Type 2 diabetes mellitus without complications: Secondary | ICD-10-CM | POA: Diagnosis not present

## 2020-09-21 DIAGNOSIS — M6281 Muscle weakness (generalized): Secondary | ICD-10-CM | POA: Diagnosis not present

## 2020-10-13 DIAGNOSIS — G4701 Insomnia due to medical condition: Secondary | ICD-10-CM | POA: Diagnosis not present

## 2020-10-13 DIAGNOSIS — G301 Alzheimer's disease with late onset: Secondary | ICD-10-CM | POA: Diagnosis not present

## 2020-12-08 DIAGNOSIS — G4701 Insomnia due to medical condition: Secondary | ICD-10-CM | POA: Diagnosis not present

## 2020-12-08 DIAGNOSIS — G301 Alzheimer's disease with late onset: Secondary | ICD-10-CM | POA: Diagnosis not present

## 2020-12-28 DIAGNOSIS — K219 Gastro-esophageal reflux disease without esophagitis: Secondary | ICD-10-CM | POA: Diagnosis not present

## 2020-12-28 DIAGNOSIS — E118 Type 2 diabetes mellitus with unspecified complications: Secondary | ICD-10-CM | POA: Diagnosis not present

## 2020-12-28 DIAGNOSIS — E782 Mixed hyperlipidemia: Secondary | ICD-10-CM | POA: Diagnosis not present

## 2021-02-02 DIAGNOSIS — G301 Alzheimer's disease with late onset: Secondary | ICD-10-CM | POA: Diagnosis not present

## 2021-02-02 DIAGNOSIS — G4701 Insomnia due to medical condition: Secondary | ICD-10-CM | POA: Diagnosis not present

## 2021-02-15 DIAGNOSIS — E118 Type 2 diabetes mellitus with unspecified complications: Secondary | ICD-10-CM | POA: Diagnosis not present

## 2021-02-15 DIAGNOSIS — E782 Mixed hyperlipidemia: Secondary | ICD-10-CM | POA: Diagnosis not present

## 2021-02-15 DIAGNOSIS — K219 Gastro-esophageal reflux disease without esophagitis: Secondary | ICD-10-CM | POA: Diagnosis not present

## 2021-02-18 DIAGNOSIS — I10 Essential (primary) hypertension: Secondary | ICD-10-CM | POA: Diagnosis not present

## 2021-02-18 DIAGNOSIS — D519 Vitamin B12 deficiency anemia, unspecified: Secondary | ICD-10-CM | POA: Diagnosis not present

## 2021-02-18 DIAGNOSIS — E039 Hypothyroidism, unspecified: Secondary | ICD-10-CM | POA: Diagnosis not present

## 2021-02-18 DIAGNOSIS — E559 Vitamin D deficiency, unspecified: Secondary | ICD-10-CM | POA: Diagnosis not present

## 2021-04-12 DIAGNOSIS — E118 Type 2 diabetes mellitus with unspecified complications: Secondary | ICD-10-CM | POA: Diagnosis not present

## 2021-04-12 DIAGNOSIS — K219 Gastro-esophageal reflux disease without esophagitis: Secondary | ICD-10-CM | POA: Diagnosis not present

## 2021-04-12 DIAGNOSIS — E782 Mixed hyperlipidemia: Secondary | ICD-10-CM | POA: Diagnosis not present

## 2021-04-13 DIAGNOSIS — G4701 Insomnia due to medical condition: Secondary | ICD-10-CM | POA: Diagnosis not present

## 2021-04-13 DIAGNOSIS — G301 Alzheimer's disease with late onset: Secondary | ICD-10-CM | POA: Diagnosis not present

## 2021-06-07 DIAGNOSIS — E782 Mixed hyperlipidemia: Secondary | ICD-10-CM | POA: Diagnosis not present

## 2021-06-07 DIAGNOSIS — M6281 Muscle weakness (generalized): Secondary | ICD-10-CM | POA: Diagnosis not present

## 2021-06-07 DIAGNOSIS — E118 Type 2 diabetes mellitus with unspecified complications: Secondary | ICD-10-CM | POA: Diagnosis not present

## 2021-06-08 DIAGNOSIS — G4701 Insomnia due to medical condition: Secondary | ICD-10-CM | POA: Diagnosis not present

## 2021-06-08 DIAGNOSIS — G301 Alzheimer's disease with late onset: Secondary | ICD-10-CM | POA: Diagnosis not present

## 2021-11-11 DIAGNOSIS — E119 Type 2 diabetes mellitus without complications: Secondary | ICD-10-CM | POA: Diagnosis not present

## 2022-08-05 DEATH — deceased
